# Patient Record
Sex: Male | Born: 1965
Health system: Southern US, Community
[De-identification: ages and names within clinical notes are randomized; demographics above are authoritative.]

## PROBLEM LIST (undated history)

## (undated) DIAGNOSIS — F329 Major depressive disorder, single episode, unspecified: Secondary | ICD-10-CM

## (undated) DIAGNOSIS — IMO0001 Reserved for inherently not codable concepts without codable children: Secondary | ICD-10-CM

## (undated) DIAGNOSIS — K219 Gastro-esophageal reflux disease without esophagitis: Secondary | ICD-10-CM

## (undated) DIAGNOSIS — E785 Hyperlipidemia, unspecified: Secondary | ICD-10-CM

## (undated) DIAGNOSIS — M25562 Pain in left knee: Secondary | ICD-10-CM

## (undated) DIAGNOSIS — M549 Dorsalgia, unspecified: Secondary | ICD-10-CM

## (undated) DIAGNOSIS — M25519 Pain in unspecified shoulder: Secondary | ICD-10-CM

## (undated) DIAGNOSIS — F32A Depression, unspecified: Secondary | ICD-10-CM

## (undated) DIAGNOSIS — G8929 Other chronic pain: Secondary | ICD-10-CM

## (undated) DIAGNOSIS — T7840XA Allergy, unspecified, initial encounter: Secondary | ICD-10-CM

## (undated) DIAGNOSIS — E119 Type 2 diabetes mellitus without complications: Secondary | ICD-10-CM

## (undated) DIAGNOSIS — I10 Essential (primary) hypertension: Secondary | ICD-10-CM

## (undated) DIAGNOSIS — Z8739 Personal history of other diseases of the musculoskeletal system and connective tissue: Secondary | ICD-10-CM

## (undated) DIAGNOSIS — M25561 Pain in right knee: Secondary | ICD-10-CM

## (undated) DIAGNOSIS — Z9289 Personal history of other medical treatment: Secondary | ICD-10-CM

## (undated) DIAGNOSIS — N289 Disorder of kidney and ureter, unspecified: Secondary | ICD-10-CM

## (undated) HISTORY — DX: Personal history of other medical treatment: Z92.89

## (undated) HISTORY — DX: Allergy, unspecified, initial encounter: T78.40XA

## (undated) HISTORY — DX: Essential (primary) hypertension: I10

## (undated) HISTORY — DX: Hyperlipidemia, unspecified: E78.5

---

## 2011-07-04 ENCOUNTER — Emergency Department (HOSPITAL_BASED_OUTPATIENT_CLINIC_OR_DEPARTMENT_OTHER)
Admission: EM | Admit: 2011-07-04 | Discharge: 2011-07-04 | Disposition: A | Discharge status: Home / Self Care | Payer: Self-pay | Attending: Emergency Medicine | Admitting: Emergency Medicine

## 2011-07-04 DIAGNOSIS — J45901 Unspecified asthma with (acute) exacerbation: Secondary | ICD-10-CM

## 2011-07-04 DIAGNOSIS — J45909 Unspecified asthma, uncomplicated: Secondary | ICD-10-CM | POA: Insufficient documentation

## 2011-07-04 DIAGNOSIS — R0602 Shortness of breath: Secondary | ICD-10-CM | POA: Insufficient documentation

## 2011-07-04 MED ORDER — PREDNISONE 20 MG PO TABS: 40.0000 mg | ORAL_TABLET | Freq: Every day | ORAL | Status: AC

## 2011-07-04 MED ORDER — PREDNISONE 10 MG PO TABS
60.0000 mg | ORAL_TABLET | Freq: Once | ORAL | Status: AC
  Administered 2011-07-04: 60 mg via ORAL
  Filled 2011-07-04: qty 1

## 2011-07-04 MED ORDER — ALBUTEROL SULFATE HFA 108 (90 BASE) MCG/ACT IN AERS
2.0000 | INHALATION_SPRAY | RESPIRATORY_TRACT | Status: DC | PRN
  Administered 2011-07-04: 2 via RESPIRATORY_TRACT
  Filled 2011-07-04: qty 6.7

## 2011-07-04 MED ORDER — ALBUTEROL SULFATE HFA 108 (90 BASE) MCG/ACT IN AERS: 1.0000 | INHALATION_SPRAY | Freq: Four times a day (QID) | RESPIRATORY_TRACT | Status: DC | PRN

## 2011-07-04 NOTE — ED Notes (Signed)
MD at bedside. 

## 2011-07-04 NOTE — ED Notes (Signed)
Pt reports intermittent SHOB x 2 weeks.  He is out of Albuterol Inhaler

## 2011-07-04 NOTE — ED Provider Notes (Signed)
History     CSN: [id number] Arrival date & time: 07/04/2011  7:26 AM   First MD Initiated Contact with Patient 07/04/11 [phone]      Chief Complaint  Patient presents with  . Shortness of Breath    (Consider location/radiation/quality/duration/timing/severity/associated sxs/prior treatment) Patient is a 45 y.o. male presenting with shortness of breath. The history is provided by the patient.  Shortness of Breath  The current episode started more than 2 weeks ago. The onset was gradual. The problem occurs frequently. The problem has been gradually worsening. The problem is moderate. The symptoms are relieved by cold air and beta-agonist inhalers. The symptoms are aggravated by smoke exposure. Associated symptoms include cough, shortness of breath and wheezing. Pertinent negatives include no chest pain, no fever, no rhinorrhea and no sore throat. The cough is non-productive, dry and hacking. The cough is relieved by beta-agonist inhalers and cold air. He was not exposed to toxic fumes. He has not inhaled smoke recently. His past medical history is significant for asthma. He has been behaving normally. Urine output has been normal. There were no sick contacts. Services received include medications given.   Pt recently moved in with sister who smokes all the time and since the new smoke exposure he has been having trouble with his asthma.  Inhaler helps some but states he thinks he is out of it.  Past Medical History  Diagnosis Date  . Asthma     History reviewed. No pertinent past surgical history.  No family history on file.  History  Substance Use Topics  . Smoking status: Former Games developer  . Smokeless tobacco: Never Used  . Alcohol Use: No      Review of Systems  Constitutional: Negative for fever.  HENT: Negative for sore throat and rhinorrhea.   Respiratory: Positive for cough, shortness of breath and wheezing.   Cardiovascular: Negative for chest pain.  All other systems  reviewed and are negative.    Allergies  Review of patient's allergies indicates no known allergies.  Home Medications   Current Outpatient Rx  Name Route Sig Dispense Refill  . IPRATROPIUM-ALBUTEROL 18-103 MCG/ACT IN AERO Inhalation Inhale 2 puffs into the lungs every 6 (six) hours as needed.        BP 131/71  Pulse 64  Temp(Src) 98.2 F (36.8 C) (Oral)  Resp 16  Ht 6\' 1"  (1.854 m)  Wt 198 lb (89.812 kg)  BMI 26.12 kg/m2  SpO2 100%  Physical Exam  Nursing note and vitals reviewed. Constitutional: He is oriented to person, place, and time. He appears well-developed and well-nourished. No distress.  HENT:  Head: Normocephalic and atraumatic.  Eyes: Pupils are equal, round, and reactive to light.  Neck: Normal range of motion. Neck supple.  Cardiovascular: Normal rate, regular rhythm, normal heart sounds and intact distal pulses.   Pulmonary/Chest: Effort normal and breath sounds normal. No respiratory distress. He has no wheezes.  Neurological: He is alert and oriented to person, place, and time.  Skin: Skin is warm and dry.  Psychiatric: He has a normal mood and affect. His behavior is normal. Judgment normal.    ED Course  Procedures (including critical care time)  Labs Reviewed - No data to display No results found.   No diagnosis found.    MDM  Pt with typical asthma exacerbation  symptoms.  No infectious sx, productive cough or other complaints.  No wheezing on exam but sx for 2 weeks and ongoing exposure to smoke due to  living situation.  will give steroids, albuterol pump.         Gwyneth Sprout, MD 07/04/11 [phone]

## 2011-10-29 ENCOUNTER — Encounter (HOSPITAL_COMMUNITY): Payer: Self-pay | Admitting: Emergency Medicine

## 2011-10-29 ENCOUNTER — Emergency Department (HOSPITAL_COMMUNITY)
Admission: EM | Admit: 2011-10-29 | Discharge: 2011-10-29 | Discharge status: Against Medical Advice | Payer: Self-pay | Attending: Emergency Medicine | Admitting: Emergency Medicine

## 2011-10-29 DIAGNOSIS — Z0389 Encounter for observation for other suspected diseases and conditions ruled out: Secondary | ICD-10-CM | POA: Insufficient documentation

## 2011-10-29 NOTE — ED Notes (Signed)
Pt states he has had a headache on and off for the past week and a half and is c/o bilateral knee pain right worse than the left and is c/o low back pain  Pt states it hurts to sit for a long time or stand for a long time  Pt states he periodically has pain in his neck  Pt states he also has reflux after eating and he feels like food gets hung up in his throat while eating and he has to drink a lot to wash it down

## 2011-11-07 ENCOUNTER — Emergency Department (HOSPITAL_BASED_OUTPATIENT_CLINIC_OR_DEPARTMENT_OTHER)
Admission: EM | Admit: 2011-11-07 | Discharge: 2011-11-07 | Disposition: A | Discharge status: Home Health | Payer: Self-pay | Attending: Emergency Medicine | Admitting: Emergency Medicine

## 2011-11-07 ENCOUNTER — Encounter (HOSPITAL_BASED_OUTPATIENT_CLINIC_OR_DEPARTMENT_OTHER): Payer: Self-pay | Admitting: Emergency Medicine

## 2011-11-07 DIAGNOSIS — M25519 Pain in unspecified shoulder: Secondary | ICD-10-CM | POA: Insufficient documentation

## 2011-11-07 DIAGNOSIS — M549 Dorsalgia, unspecified: Secondary | ICD-10-CM | POA: Insufficient documentation

## 2011-11-07 DIAGNOSIS — M25569 Pain in unspecified knee: Secondary | ICD-10-CM | POA: Insufficient documentation

## 2011-11-07 DIAGNOSIS — G8929 Other chronic pain: Secondary | ICD-10-CM | POA: Insufficient documentation

## 2011-11-07 DIAGNOSIS — J45909 Unspecified asthma, uncomplicated: Secondary | ICD-10-CM | POA: Insufficient documentation

## 2011-11-07 HISTORY — DX: Pain in unspecified shoulder: M25.519

## 2011-11-07 HISTORY — DX: Other chronic pain: G89.29

## 2011-11-07 HISTORY — DX: Pain in left knee: M25.562

## 2011-11-07 HISTORY — DX: Pain in right knee: M25.561

## 2011-11-07 HISTORY — DX: Dorsalgia, unspecified: M54.9

## 2011-11-07 MED ORDER — IBUPROFEN 800 MG PO TABS
800.0000 mg | ORAL_TABLET | Freq: Once | ORAL | Status: AC
  Administered 2011-11-07: 800 mg via ORAL
  Filled 2011-11-07: qty 1

## 2011-11-07 MED ORDER — PREDNISONE 10 MG PO TABS: 20.0000 mg | ORAL_TABLET | Freq: Every day | ORAL | Status: DC

## 2011-11-07 MED ORDER — OXYCODONE-ACETAMINOPHEN 5-325 MG PO TABS
1.0000 | ORAL_TABLET | Freq: Once | ORAL | Status: AC
  Administered 2011-11-07: 1 via ORAL
  Filled 2011-11-07: qty 1

## 2011-11-07 MED ORDER — CYCLOBENZAPRINE HCL 10 MG PO TABS: 10.0000 mg | ORAL_TABLET | Freq: Two times a day (BID) | ORAL | Status: AC | PRN
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
## 2011-11-07 NOTE — Discharge Instructions (Signed)
Back Exercises Back exercises help treat and prevent back injuries. The goal of back exercises is to increase the strength of your abdominal and back muscles and the flexibility of your back. These exercises should be started when you no longer have back pain. Back exercises include:  Pelvic Tilt. Lie on your back with your knees bent. Tilt your pelvis until the lower part of your back is against the floor. Hold this position 5 to 10 sec and repeat 5 to 10 times.   Knee to Chest. Pull first 1 knee up against your chest and hold for 20 to 30 seconds, repeat this with the other knee, and then both knees. This may be done with the other leg straight or bent, whichever feels better.   Sit-Ups or Curl-Ups. Bend your knees 90 degrees. Start with tilting your pelvis, and do a partial, slow sit-up, lifting your trunk only 30 to 45 degrees off the floor. Take at least 2 to 3 seconds for each sit-up. Do not do sit-ups with your knees out straight. If partial sit-ups are difficult, simply do the above but with only tightening your abdominal muscles and holding it as directed.   Hip-Lift. Lie on your back with your knees flexed 90 degrees. Push down with your feet and shoulders as you raise your hips a couple inches off the floor; hold for 10 seconds, repeat 5 to 10 times.   Back arches. Lie on your stomach, propping yourself up on bent elbows. Slowly press on your hands, causing an arch in your low back. Repeat 3 to 5 times. Any initial stiffness and discomfort should lessen with repetition over time.   Shoulder-Lifts. Lie face down with arms beside your body. Keep hips and torso pressed to floor as you slowly lift your head and shoulders off the floor.  Do not overdo your exercises, especially in the beginning. Exercises may cause you some mild back discomfort which lasts for a few minutes; however, if the pain is more severe, or lasts for more than 15 minutes, do not continue exercises until you see your  caregiver. Improvement with exercise therapy for back problems is slow.  See your caregivers for assistance with developing a proper back exercise program. Document Released: 10/11/2004 Document Revised: 05/02/2011 Document Reviewed: 09/03/2005 Exit[employer] Patient Information 2012 [employer].Back Pain, Adult Low back pain is very common. About 1 in 5 people have back pain.The cause of low back pain is rarely dangerous. The pain often gets better over time.About half of people with a sudden onset of back pain feel better in just 2 weeks. About 8 in 10 people feel better by 6 weeks.  CAUSES Some common causes of back pain include:  Strain of the muscles or ligaments supporting the spine.   Wear and tear (degeneration) of the spinal discs.   Arthritis.   Direct injury to the back.  DIAGNOSIS Most of the time, the direct cause of low back pain is not known.However, back pain can be treated effectively even when the exact cause of the pain is unknown.Answering your caregiver's questions about your overall health and symptoms is one of the most accurate ways to make sure the cause of your pain is not dangerous. If your caregiver needs more information, he or she may order lab work or imaging tests (X-rays or MRIs).However, even if imaging tests show changes in your back, this usually does not require surgery. HOME CARE INSTRUCTIONS For many people, back pain returns.Since low back pain is rarely   dangerous, it is often a condition that people can learn to manageon their own.   Remain active. It is stressful on the back to sit or stand in one place. Do not sit, drive, or stand in one place for more than 30 minutes at a time. Take short walks on level surfaces as soon as pain allows.Try to increase the length of time you walk each day.   Do not stay in bed.Resting more than 1 or 2 days can delay your recovery.   Do not avoid exercise or work.Your body is made to move.It is not dangerous  to be active, even though your back may hurt.Your back will likely heal faster if you return to being active before your pain is gone.   Pay attention to your body when you bend and lift. Many people have less discomfortwhen lifting if they bend their knees, keep the load close to their bodies,and avoid twisting. Often, the most comfortable positions are those that put less stress on your recovering back.   Find a comfortable position to sleep. Use a firm mattress and lie on your side with your knees slightly bent. If you lie on your back, put a pillow under your knees.   Only take over-the-counter or prescription medicines as directed by your caregiver. Over-the-counter medicines to reduce pain and inflammation are often the most helpful.Your caregiver may prescribe muscle relaxant drugs.These medicines help dull your pain so you can more quickly return to your normal activities and healthy exercise.   Put ice on the injured area.   Put ice in a plastic bag.   Place a towel between your skin and the bag.   Leave the ice on for 15 to 20 minutes, 3 to 4 times a day for the first 2 to 3 days. After that, ice and heat may be alternated to reduce pain and spasms.   Ask your caregiver about trying back exercises and gentle massage. This may be of some benefit.   Avoid feeling anxious or stressed.Stress increases muscle tension and can worsen back pain.It is important to recognize when you are anxious or stressed and learn ways to manage it.Exercise is a great option.  SEEK MEDICAL CARE IF:  You have pain that is not relieved with rest or medicine.   You have pain that does not improve in 1 week.   You have new symptoms.   You are generally not feeling well.  SEEK IMMEDIATE MEDICAL CARE IF:   You have pain that radiates from your back into your legs.   You develop new bowel or bladder control problems.   You have unusual weakness or numbness in your arms or legs.   You develop  nausea or vomiting.   You develop abdominal pain.   You feel faint.  Document Released: 09/03/2005 Document Revised: 05/16/2011 Document Reviewed: 01/22/2011 [employer] Patient Information 2012 [employer]. 

## 2011-11-07 NOTE — ED Notes (Addendum)
Pt states he has chronic back pain, worse in last month. Some numbness/tingling in legs.  No problems with urination or BM.  Pt states that he may have lifted something at work and aggrivated his pain, also has new bed which may have worsened his pain.  Pt also states he is having left shoulder pain, bilateral knee pain x one year.  Also c/o headache one month ago.  Pain over both eyes.  No nasal congestion.  Pt had fever one month ago.  Pt states he is having a lot of pain all over.

## 2011-11-09 ENCOUNTER — Encounter: Payer: Self-pay | Admitting: Family Medicine

## 2011-11-09 ENCOUNTER — Ambulatory Visit (HOSPITAL_BASED_OUTPATIENT_CLINIC_OR_DEPARTMENT_OTHER)
Admission: RE | Admit: 2011-11-09 | Discharge: 2011-11-09 | Disposition: A | Discharge status: Home / Self Care | Payer: Self-pay | Source: Ambulatory Visit | Attending: Family Medicine | Admitting: Family Medicine

## 2011-11-09 ENCOUNTER — Ambulatory Visit (INDEPENDENT_AMBULATORY_CARE_PROVIDER_SITE_OTHER): Payer: Self-pay | Admitting: Family Medicine

## 2011-11-09 DIAGNOSIS — M545 Low back pain, unspecified: Secondary | ICD-10-CM | POA: Insufficient documentation

## 2011-11-09 DIAGNOSIS — M25561 Pain in right knee: Secondary | ICD-10-CM

## 2011-11-09 DIAGNOSIS — M25569 Pain in unspecified knee: Secondary | ICD-10-CM

## 2011-11-09 DIAGNOSIS — M549 Dorsalgia, unspecified: Secondary | ICD-10-CM

## 2011-11-09 DIAGNOSIS — G8929 Other chronic pain: Secondary | ICD-10-CM

## 2011-11-09 NOTE — Patient Instructions (Signed)
You have chronic low back pain - you have mild arthritis but typically pain isn't this severe with only arthritis. You may have a bulging or herniated disc as well as muscle spasms - all of these are treated the same initially. Take tylenol for baseline pain relief (1-2 extra strength tabs 3x/day) Meloxicam daily with food for pain and inflammation (if you do not have stomach or kidney issues). Tramadol as needed for severe pain (no driving on this medicine). Flexeril as needed for muscle spasms (no driving on this medicine). Stay as active as possible. Start physical therapy and do home exercises they show you on days you do not go. If after 1 month to 6 weeks you haven't improved as expected, would then consider MRI to further assess. Make sure you work with Rudell Cobb for [facility] Coverage (do this now). Consider massage, chiropractor, physical therapy, and/or acupuncture. Physical therapy has been shown to be helpful while the others have mixed results. Strengthening of low back muscles, abdominal musculature are key for long term pain relief.  For your knees you really don't have much in the way of arthritis - mild on left, minimal on right. Medicines discussed above should help some. Glucosamine sulfate 750mg  twice a day is a supplement that has been shown to help with arthritis. Capsaicin topically up to four times a day may also help with pain (over the counter). Cortisone injections are an option. If cortisone injections do not help, there are different types of shots that may help but they take longer to take effect. It's important that you continue to stay active. Consider physical therapy to strengthen muscles around the joint that hurts to take pressure off of the joint itself. Heat or ice 15 minutes at a time 3-4 times a day as needed to help with pain. Water aerobics and cycling with low resistance are the best two types of exercise for arthritis.  Follow up with me in 1 month  for reevaluation.

## 2011-11-12 ENCOUNTER — Encounter: Payer: Self-pay | Admitting: Family Medicine

## 2011-11-12 DIAGNOSIS — M25561 Pain in right knee: Secondary | ICD-10-CM | POA: Insufficient documentation

## 2011-11-12 DIAGNOSIS — M545 Low back pain: Secondary | ICD-10-CM | POA: Insufficient documentation

## 2011-11-12 NOTE — Assessment & Plan Note (Signed)
only mild arthritis, possible narrowing at L5-S1 region based on radiographs.  Discussed regardless of whether this is due to lumbar strain, disc herniation, disc bulge, would trial conservative treatment for 1 month to 6 weeks with physical therapy, meloxicam, flexeril, tramadol.  If not improving with this, would move forward with MRI.

## 2011-11-12 NOTE — Assessment & Plan Note (Signed)
minimal-mild DJD, possible degenerative meniscal tears as cause as well.  No mechanical symptoms.  Start meloxicam, glucosamine.  Discussed capsaicin.  He would like to hold off on cortisone injections though stated would consider these if not improving as expected.  F/u in 1 month
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
## 2011-11-26 ENCOUNTER — Ambulatory Visit: Payer: Self-pay | Admitting: Physical Therapy

## 2011-12-03 ENCOUNTER — Ambulatory Visit: Payer: Self-pay | Attending: Family Medicine | Admitting: Physical Therapy

## 2011-12-03 DIAGNOSIS — M25569 Pain in unspecified knee: Secondary | ICD-10-CM | POA: Insufficient documentation

## 2011-12-03 DIAGNOSIS — IMO0001 Reserved for inherently not codable concepts without codable children: Secondary | ICD-10-CM | POA: Insufficient documentation

## 2011-12-03 DIAGNOSIS — M545 Low back pain, unspecified: Secondary | ICD-10-CM | POA: Insufficient documentation

## 2011-12-03 DIAGNOSIS — R5381 Other malaise: Secondary | ICD-10-CM | POA: Insufficient documentation

## 2011-12-07 ENCOUNTER — Ambulatory Visit: Payer: Self-pay | Admitting: Family Medicine

## 2011-12-12 ENCOUNTER — Ambulatory Visit: Payer: Self-pay | Admitting: Rehabilitation

## 2011-12-18 ENCOUNTER — Ambulatory Visit: Payer: No Typology Code available for payment source | Attending: Family Medicine | Admitting: Rehabilitation

## 2011-12-18 DIAGNOSIS — M25569 Pain in unspecified knee: Secondary | ICD-10-CM | POA: Insufficient documentation

## 2011-12-18 DIAGNOSIS — IMO0001 Reserved for inherently not codable concepts without codable children: Secondary | ICD-10-CM | POA: Insufficient documentation

## 2011-12-18 DIAGNOSIS — M545 Low back pain, unspecified: Secondary | ICD-10-CM | POA: Insufficient documentation

## 2011-12-18 DIAGNOSIS — R5381 Other malaise: Secondary | ICD-10-CM | POA: Insufficient documentation

## 2011-12-26 ENCOUNTER — Encounter: Payer: Self-pay | Admitting: Physical Therapy

## 2012-01-01 ENCOUNTER — Encounter: Payer: Self-pay | Admitting: Rehabilitation

## 2012-01-09 ENCOUNTER — Encounter: Payer: Self-pay | Admitting: Physical Therapy

## 2012-02-08 ENCOUNTER — Emergency Department (HOSPITAL_BASED_OUTPATIENT_CLINIC_OR_DEPARTMENT_OTHER)
Admission: EM | Admit: 2012-02-08 | Discharge: 2012-02-08 | Disposition: A | Discharge status: Home / Self Care | Payer: Self-pay | Attending: Emergency Medicine | Admitting: Emergency Medicine

## 2012-02-08 ENCOUNTER — Encounter (HOSPITAL_BASED_OUTPATIENT_CLINIC_OR_DEPARTMENT_OTHER): Payer: Self-pay | Admitting: *Deleted

## 2012-02-08 DIAGNOSIS — J45901 Unspecified asthma with (acute) exacerbation: Secondary | ICD-10-CM | POA: Insufficient documentation

## 2012-02-08 DIAGNOSIS — G8929 Other chronic pain: Secondary | ICD-10-CM | POA: Insufficient documentation

## 2012-02-08 DIAGNOSIS — K219 Gastro-esophageal reflux disease without esophagitis: Secondary | ICD-10-CM | POA: Insufficient documentation

## 2012-02-08 DIAGNOSIS — I10 Essential (primary) hypertension: Secondary | ICD-10-CM | POA: Insufficient documentation

## 2012-02-08 HISTORY — DX: Major depressive disorder, single episode, unspecified: F32.9

## 2012-02-08 HISTORY — DX: Gastro-esophageal reflux disease without esophagitis: K21.9

## 2012-02-08 HISTORY — DX: Depression, unspecified: F32.A

## 2012-02-08 HISTORY — DX: Reserved for inherently not codable concepts without codable children: IMO0001

## 2012-02-08 MED ORDER — PREDNISONE 20 MG PO TABS: 60.0000 mg | ORAL_TABLET | Freq: Every day | ORAL | Status: AC

## 2012-02-08 MED ORDER — ALBUTEROL SULFATE HFA 108 (90 BASE) MCG/ACT IN AERS
2.0000 | INHALATION_SPRAY | RESPIRATORY_TRACT | Status: DC | PRN
  Administered 2012-02-08: 2 via RESPIRATORY_TRACT
  Filled 2012-02-08: qty 6.7

## 2012-02-08 MED ORDER — ALBUTEROL SULFATE (5 MG/ML) 0.5% IN NEBU
5.0000 mg | INHALATION_SOLUTION | Freq: Once | RESPIRATORY_TRACT | Status: AC
Start: 2012-02-08 — End: 2012-02-08
  Administered 2012-02-08: 5 mg via RESPIRATORY_TRACT
  Filled 2012-02-08: qty 1

## 2012-02-08 MED ORDER — PREDNISONE 10 MG PO TABS
60.0000 mg | ORAL_TABLET | Freq: Once | ORAL | Status: AC
  Administered 2012-02-08: 60 mg via ORAL
  Filled 2012-02-08: qty 1

## 2012-02-08 MED ORDER — IPRATROPIUM BROMIDE 0.02 % IN SOLN
0.5000 mg | Freq: Once | RESPIRATORY_TRACT | Status: AC
  Administered 2012-02-08: 0.5 mg via RESPIRATORY_TRACT
  Filled 2012-02-08: qty 2.5

## 2012-02-08 NOTE — Discharge Instructions (Signed)
Asthma, Adult  Asthma is caused by narrowing of the air passages in the lungs. It may be triggered by pollen, dust, animal dander, molds, some foods, respiratory infections, exposure to smoke, exercise, emotional stress or other allergens (things that cause allergic reactions or allergies). Repeat attacks are common.  HOME CARE INSTRUCTIONS    Use prescription medications as ordered by your caregiver.   Avoid pollen, dust, animal dander, molds, smoke and other things that cause attacks at home and at work.   You may have fewer attacks if you decrease dust in your home. Electrostatic air cleaners may help.   It may help to replace your pillows or mattress with materials less likely to cause allergies.   Talk to your caregiver about an action plan for managing asthma attacks at home, including, the use of a peak flow meter which measures the severity of your asthma attack. An action plan can help minimize or stop the attack without having to seek medical care.   If you are not on a fluid restriction, drink 8 to 10 glasses of water each day.   Always have a plan prepared for seeking medical attention, including, calling your physician, accessing local emergency care, and calling 911 (in the [place] for a severe attack.   Discuss possible exercise routines with your caregiver.   If animal dander is the cause of asthma, you may need to get rid of pets.  SEEK MEDICAL CARE IF:    You have wheezing and shortness of breath even if taking medicine to prevent attacks.   You have muscle aches, chest pain or thickening of sputum.   Your sputum changes from clear or [place] to yellow, green, gray, or bloody.   You have any problems that may be related to the medicine you are taking (such as a rash, itching, swelling or trouble breathing).  SEEK IMMEDIATE MEDICAL CARE IF:    Your usual medicines do not stop your wheezing or there is increased coughing and/or shortness of breath.   You have increased difficulty  breathing.   You have a fever.  MAKE SURE YOU:    Understand these instructions.   Will watch your condition.   Will get help right away if you are not doing well or get worse.  Document Released: 09/03/2005 Document Revised: 08/23/2011 Document Reviewed: 04/21/2008  ExitCare Patient Information 2012 [employer]  Asthma, Adult  Asthma is caused by narrowing of the air passages in the lungs. It may be triggered by pollen, dust, animal dander, molds, some foods, respiratory infections, exposure to smoke, exercise, emotional stress or other allergens (things that cause allergic reactions or allergies). Repeat attacks are common.  HOME CARE INSTRUCTIONS    Use prescription medications as ordered by your caregiver.   Avoid pollen, dust, animal dander, molds, smoke and other things that cause attacks at home and at work.   You may have fewer attacks if you decrease dust in your home. Electrostatic air cleaners may help.   It may help to replace your pillows or mattress with materials less likely to cause allergies.   Talk to your caregiver about an action plan for managing asthma attacks at home, including, the use of a peak flow meter which measures the severity of your asthma attack. An action plan can help minimize or stop the attack without having to seek medical care.   If you are not on a fluid restriction, drink 8 to 10 glasses of water each day.   Always have   a plan prepared for seeking medical attention, including, calling your physician, accessing local emergency care, and calling 911 (in the [place] for a severe attack.   Discuss possible exercise routines with your caregiver.   If animal dander is the cause of asthma, you may need to get rid of pets.  SEEK MEDICAL CARE IF:    You have wheezing and shortness of breath even if taking medicine to prevent attacks.   You have muscle aches, chest pain or thickening of sputum.   Your sputum changes from clear or [place] to yellow, green, gray, or  bloody.   You have any problems that may be related to the medicine you are taking (such as a rash, itching, swelling or trouble breathing).  SEEK IMMEDIATE MEDICAL CARE IF:    Your usual medicines do not stop your wheezing or there is increased coughing and/or shortness of breath.   You have increased difficulty breathing.   You have a fever.  MAKE SURE YOU:    Understand these instructions.   Will watch your condition.   Will get help right away if you are not doing well or get worse.  Document Released: 09/03/2005 Document Revised: 08/23/2011 Document Reviewed: 04/21/2008  Exit[employer] Patient Information 2012 [employer]

## 2012-02-08 NOTE — ED Notes (Signed)
Chronic Back pain for one year.  Today back and neck pain, right elbow numbness upon waking.  Was seen by Dr. Parks Neptune and given mediciation, which he did not get filled.
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
## 2012-02-20 ENCOUNTER — Encounter: Payer: Self-pay | Admitting: Family Medicine

## 2012-02-20 ENCOUNTER — Ambulatory Visit (INDEPENDENT_AMBULATORY_CARE_PROVIDER_SITE_OTHER): Payer: Self-pay | Admitting: Family Medicine

## 2012-02-20 VITALS — BP 143/99 | HR 73 | Temp 98.2°F | Ht 73.0 in | Wt 202.0 lb

## 2012-02-20 DIAGNOSIS — M25561 Pain in right knee: Secondary | ICD-10-CM

## 2012-02-20 DIAGNOSIS — M545 Low back pain: Secondary | ICD-10-CM

## 2012-02-20 DIAGNOSIS — M25569 Pain in unspecified knee: Secondary | ICD-10-CM

## 2012-02-20 DIAGNOSIS — M542 Cervicalgia: Secondary | ICD-10-CM

## 2012-02-20 DIAGNOSIS — M25562 Pain in left knee: Secondary | ICD-10-CM

## 2012-02-20 DIAGNOSIS — G8929 Other chronic pain: Secondary | ICD-10-CM

## 2012-02-20 MED ORDER — PREDNISONE 10 MG PO TABS: ORAL_TABLET | ORAL | Status: DC

## 2012-02-20 NOTE — Patient Instructions (Signed)
Start prednisone dose pack x 6 days as directed for your neck - do not take meloxicam while you are on this. We will go ahead with an MRI of your low back since this has not improved as expected with PT, medications. For your knee you were given a cortisone injection today - we can repeat these as often as every 3 months if necessary. I will call you with the MRI results and also to check on your progress usually the business day following the MRI.

## 2012-02-22 ENCOUNTER — Encounter: Payer: Self-pay | Admitting: Family Medicine
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
## 2012-02-26 ENCOUNTER — Ambulatory Visit (HOSPITAL_BASED_OUTPATIENT_CLINIC_OR_DEPARTMENT_OTHER)
Admission: RE | Admit: 2012-02-26 | Discharge: 2012-02-26 | Disposition: A | Discharge status: Home / Self Care | Payer: Self-pay | Source: Ambulatory Visit | Attending: Family Medicine | Admitting: Family Medicine

## 2012-02-26 DIAGNOSIS — M545 Low back pain: Secondary | ICD-10-CM

## 2012-02-26 DIAGNOSIS — M47817 Spondylosis without myelopathy or radiculopathy, lumbosacral region: Secondary | ICD-10-CM | POA: Insufficient documentation

## 2012-02-26 DIAGNOSIS — M542 Cervicalgia: Secondary | ICD-10-CM | POA: Insufficient documentation

## 2012-02-26 DIAGNOSIS — M48061 Spinal stenosis, lumbar region without neurogenic claudication: Secondary | ICD-10-CM | POA: Insufficient documentation

## 2012-02-26 NOTE — Assessment & Plan Note (Signed)
minimal-mild DJD, possible degenerative meniscal tears as cause as well.  Right knee now feeling worse with occasional feeling of giving out.  Can continue meloxicam, try glucosamine and capsaicin.  Given cortisone injection right knee.    After informed written consent, patient was seated on exam table. Right knee was prepped with alcohol swab and utilizing anteromedial approach, patient's right knee was injected intraarticularly with 3:1 marcaine: depomedrol. Patient tolerated the procedure well without immediate complications.

## 2012-02-26 NOTE — Assessment & Plan Note (Signed)
symptoms indicative of mild cervical radiculopathy.  Start with prednisone dose pack.  If still having difficulty would consider formal PT.

## 2012-02-26 NOTE — Assessment & Plan Note (Signed)
only mild arthritis, possible narrowing at L5-S1 region based on radiographs.  Has completed PT and HEP - feels worse after this.  Tried meloxicam, tramadol, tylenol, and flexeril.  Will move forward with MRI of lumbar spine.

## 2012-03-05 ENCOUNTER — Emergency Department (INDEPENDENT_AMBULATORY_CARE_PROVIDER_SITE_OTHER): Payer: Self-pay

## 2012-03-05 ENCOUNTER — Encounter (HOSPITAL_COMMUNITY): Payer: Self-pay | Admitting: *Deleted

## 2012-03-05 ENCOUNTER — Emergency Department (INDEPENDENT_AMBULATORY_CARE_PROVIDER_SITE_OTHER)
Admission: EM | Admit: 2012-03-05 | Discharge: 2012-03-05 | Disposition: A | Discharge status: Home / Self Care | Payer: Self-pay | Source: Home / Self Care | Attending: Emergency Medicine | Admitting: Emergency Medicine

## 2012-03-05 DIAGNOSIS — IMO0002 Reserved for concepts with insufficient information to code with codable children: Secondary | ICD-10-CM

## 2012-03-05 DIAGNOSIS — T148XXA Other injury of unspecified body region, initial encounter: Secondary | ICD-10-CM

## 2012-03-05 DIAGNOSIS — S62309A Unspecified fracture of unspecified metacarpal bone, initial encounter for closed fracture: Secondary | ICD-10-CM

## 2012-03-05 MED ORDER — TETANUS-DIPHTH-ACELL PERTUSSIS 5-2.5-18.5 LF-MCG/0.5 IM SUSP
0.5000 mL | Freq: Once | INTRAMUSCULAR | Status: AC
  Administered 2012-03-05: 0.5 mL via INTRAMUSCULAR

## 2012-03-05 MED ORDER — TETANUS-DIPHTH-ACELL PERTUSSIS 5-2.5-18.5 LF-MCG/0.5 IM SUSP
INTRAMUSCULAR | Status: AC
  Filled 2012-03-05: qty 0.5

## 2012-03-05 MED ORDER — TRAMADOL HCL 50 MG PO TABS: 100.0000 mg | ORAL_TABLET | Freq: Three times a day (TID) | ORAL | Status: AC | PRN
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
## 2012-03-05 NOTE — Discharge Instructions (Signed)
You have had a wound repaired by suturing or stapeling.  Proper wound care will minimize the risk of infection.  Leave the dressing we put on in place for 24 hours.  After that you may change the dressing daily.  Keep the dressing clean and dry.  Assemble all the dressing material before the dressing change.  Wear gloves, dispose of the soiled dressings, and wash your hands before and after the dressing change.  You may bathe and shower after the first 24 hours, but we recommend not swimming or submerging the wound in water for prolonged periods.  Wash the wound gently with soap and water and pat dry.  Try to remove all dried blood and drainage.  If there is a large amount of dried blood, half strength hydrogen peroxide (half hydrogen peroxide and half water) will help to remove it.  Apply a thin layer of antibiotic ointment (Bacitracin or Polysporin--do not use Neosporin since this can cause an allergic reaction).  Cover the wound with a non-stick dressing such as Telfa.  Small wounds may be covered with a Band Aid.  If the skin around the wound looks [place], this means it is too wet.  You may leave the dressing off during the night to let it get some air, but keep it covered during the day.    If your wound has been repaired, the sutures or staples should be removed.  The following is the usual time table for removal:   Head and face--5 to 7 days.  Trunk and extremities--10 days.  Hands and feet--10 days to 2 weeks.  Do not try to remove the sutures yourself.  Come back here to have them removed.  Any sign of infection should prompt you to come back sooner for a recheck.  This includes:   Redness and heat  Swelling  Increasing pain  Fever and chills  Pus drainage   Cast or Splint Care Casts and splints support injured limbs and keep bones from moving while they heal.  HOME CARE  Keep the cast or splint uncovered during the drying period.   A plaster cast can take 24 to 48 hours  to dry.   A fiberglass cast will dry in less than 1 hour.   Do not rest the cast on anything harder than a pillow for 24 hours.   Do not put weight on your injured limb. Do not put pressure on the cast. Wait for your doctor's approval.   Keep the cast or splint dry.   Cover the cast or splint with a plastic bag during baths or wet weather.   If you have a cast over your chest and belly (trunk), take sponge baths until the cast is taken off.   Keep your cast or splint clean. Wash a dirty cast with a damp cloth.   Do not put any objects under your cast or splint. Do not scratch the skin under the cast with an object.   Do not take out the padding from inside your cast.   Exercise your joints near the cast as told by your doctor.   Raise (elevate) your injured limb on 1 or 2 pillows for the first 1 to 3 days.  GET HELP RIGHT AWAY IF:  Your cast or splint cracks.   Your cast or splint is too tight or too loose.   You itch badly under the cast.   Your cast gets wet or has a soft spot.   You have  a bad smell coming from the cast.   You get an object stuck under the cast.   Your skin around the cast becomes red or raw.   You have new or more pain after the cast is put on.   You have fluid leaking through the cast.   You cannot move your fingers or toes.   Your fingers or toes turn colors or are cool, painful, or puffy (swollen).   You have tingling or lose feeling (numbness) around the injured area.   You have pain or pressure under the cast.   You have trouble breathing or have shortness of breath.   You have chest pain.  MAKE SURE YOU:  Understand these instructions.   Will watch your condition.   Will get help right away if you are not doing well or get worse.  Document Released: 01/03/2011 Document Revised: 08/23/2011 Document Reviewed: 01/03/2011 [facility] Patient Information 2012 [place], [place].

## 2012-03-05 NOTE — Progress Notes (Signed)
Orthopedic Tech Progress Note Patient Details:  Javier Beasley January 04, 1966 [id number]  Ortho Devices Type of Ortho Device: Arm foam sling;Ulna gutter splint Ortho Device/Splint Location: right UE Ortho Device/Splint Interventions: Application   Asia R Thompson 03/05/2012, 9:11 PM

## 2012-03-11 NOTE — ED Notes (Signed)
Picked patient up just short of being discharged, received report from karen , rn.  This nurse discharged patient

## 2012-05-21 ENCOUNTER — Ambulatory Visit: Payer: Self-pay | Admitting: Sports Medicine

## 2012-09-29 ENCOUNTER — Emergency Department (INDEPENDENT_AMBULATORY_CARE_PROVIDER_SITE_OTHER)
Admission: EM | Admit: 2012-09-29 | Discharge: 2012-09-29 | Disposition: A | Discharge status: Home / Self Care | Payer: No Typology Code available for payment source | Source: Home / Self Care

## 2012-09-29 ENCOUNTER — Encounter (HOSPITAL_COMMUNITY): Payer: Self-pay

## 2012-09-29 DIAGNOSIS — M542 Cervicalgia: Secondary | ICD-10-CM

## 2012-09-29 DIAGNOSIS — M545 Low back pain: Secondary | ICD-10-CM

## 2012-09-29 DIAGNOSIS — J45909 Unspecified asthma, uncomplicated: Secondary | ICD-10-CM

## 2012-09-29 DIAGNOSIS — G8929 Other chronic pain: Secondary | ICD-10-CM

## 2012-09-29 DIAGNOSIS — M25562 Pain in left knee: Secondary | ICD-10-CM

## 2012-09-29 MED ORDER — CYCLOBENZAPRINE HCL 5 MG PO TABS: 10.0000 mg | ORAL_TABLET | Freq: Three times a day (TID) | ORAL | Status: DC | PRN

## 2012-09-29 MED ORDER — TRAMADOL HCL 50 MG PO TABS: 50.0000 mg | ORAL_TABLET | Freq: Four times a day (QID) | ORAL | Status: DC | PRN

## 2012-09-29 MED ORDER — ALBUTEROL SULFATE HFA 108 (90 BASE) MCG/ACT IN AERS: 1.0000 | INHALATION_SPRAY | Freq: Four times a day (QID) | RESPIRATORY_TRACT | Status: DC | PRN

## 2012-09-29 NOTE — Discharge Instructions (Signed)
Asthma Attack Prevention  HOW CAN ASTHMA BE PREVENTED?  Currently, there is no way to prevent asthma from starting. However, you can take steps to control the disease and prevent its symptoms after you have been diagnosed. Learn about your asthma and how to control it. Take an active role to control your asthma by working with your caregiver to create and follow an asthma action plan. An asthma action plan guides you in taking your medicines properly, avoiding factors that make your asthma worse, tracking your level of asthma control, responding to worsening asthma, and seeking emergency care when needed. To track your asthma, keep records of your symptoms, check your peak flow number using a peak flow meter (handheld device that shows how well air moves out of your lungs), and get regular asthma checkups.   Other ways to prevent asthma attacks include:   Use medicines as your caregiver directs.   Identify and avoid things that make your asthma worse (as much as you can).   Keep track of your asthma symptoms and level of control.   Get regular checkups for your asthma.   With your caregiver, write a detailed plan for taking medicines and managing an asthma attack. Then be sure to follow your action plan. Asthma is an ongoing condition that needs regular monitoring and treatment.   Identify and avoid asthma triggers. A number of outdoor allergens and irritants (pollen, mold, cold air, air pollution) can trigger asthma attacks. Find out what causes or makes your asthma worse, and take steps to avoid those triggers (see below).   Monitor your breathing. Learn to recognize warning signs of an attack, such as slight coughing, wheezing or shortness of breath. However, your lung function may already decrease before you notice any signs or symptoms, so regularly measure and record your peak airflow with a home peak flow meter.   Identify and treat attacks early. If you act quickly, you're less likely to have a  severe attack. You will also need less medicine to control your symptoms. When your peak flow measurements decrease and alert you to an upcoming attack, take your medicine as instructed, and immediately stop any activity that may have triggered the attack. If your symptoms do not improve, get medical help.   Pay attention to increasing quick-relief inhaler use. If you find yourself relying on your quick-relief inhaler (such as albuterol), your asthma is not under control. See your caregiver about adjusting your treatment.  IDENTIFY AND CONTROL FACTORS THAT MAKE YOUR ASTHMA WORSE  A number of common things can set off or make your asthma symptoms worse (asthma triggers). Keep track of your asthma symptoms for several weeks, detailing all the environmental and emotional factors that are linked with your asthma. When you have an asthma attack, go back to your asthma diary to see which factor, or combination of factors, might have contributed to it. Once you know what these factors are, you can take steps to control many of them.   Allergies: If you have allergies and asthma, it is important to take asthma prevention steps at home. Asthma attacks (worsening of asthma symptoms) can be triggered by allergies, which can cause temporary increased inflammation of your airways. Minimizing contact with the substance to which you are allergic will help prevent an asthma attack.  Animal Dander:    Some people are allergic to the flakes of skin or dried saliva from animals with fur or feathers. Keep these pets out of your home.   If   you can't keep a pet outdoors, keep the pet out of your bedroom and other sleeping areas at all times, and keep the door closed.   Remove carpets and furniture covered with cloth from your home. If that is not possible, keep the pet away from fabric-covered furniture and carpets.  Dust Mites:   Many people with asthma are allergic to dust mites. Dust mites are tiny bugs that are found in every  home, in mattresses, pillows, carpets, fabric-covered furniture, bedcovers, clothes, stuffed toys, fabric, and other fabric-covered items.   Cover your mattress in a special dust-proof cover.   Cover your pillow in a special dust-proof cover, or wash the pillow each week in hot water. Water must be hotter than 130 F to kill dust mites. Cold or warm water used with detergent and bleach can also be effective.   Wash the sheets and blankets on your bed each week in hot water.   Try not to sleep or lie on cloth-covered cushions.   Call ahead when traveling and ask for a smoke-free hotel room. Bring your own bedding and pillows, in case the hotel only supplies feather pillows and down comforters, which may contain dust mites and cause asthma symptoms.   Remove carpets from your bedroom and those laid on concrete, if you can.   Keep stuffed toys out of the bed, or wash the toys weekly in hot water or cooler water with detergent and bleach.  Cockroaches:   Many people with asthma are allergic to the droppings and remains of cockroaches.   Keep food and garbage in closed containers. Never leave food out.   Use poison baits, traps, powders, gels, or paste (for example, boric acid).   If a spray is used to kill cockroaches, stay out of the room until the odor goes away.  Indoor Mold:   Fix leaky faucets, pipes, or other sources of water that have mold around them.   Clean moldy surfaces with a cleaner that has bleach in it.  Pollen and Outdoor Mold:   When pollen or mold spore counts are high, try to keep your windows closed.   Stay indoors with windows closed from late morning to afternoon, if you can. Pollen and some mold spore counts are highest at that time.   Ask your caregiver whether you need to take or increase anti-inflammatory medicine before your allergy season starts.  Irritants:    Tobacco smoke is an irritant. If you smoke, ask your caregiver how you can quit. Ask family members to quit  smoking, too. Do not allow smoking in your home or car.   If possible, do not use a wood-burning stove, kerosene heater, or fireplace. Minimize exposure to all sources of smoke, including incense, candles, fires, and fireworks.   Try to stay away from strong odors and sprays, such as perfume, talcum powder, hair spray, and paints.   Decrease humidity in your home and use an indoor air cleaning device. Reduce indoor humidity to below 60 percent. Dehumidifiers or central air conditioners can do this.   Try to have someone else vacuum for you once or twice a week, if you can. Stay out of rooms while they are being vacuumed and for a short while afterward.   If you vacuum, use a dust mask from a hardware store, a double-layered or microfilter vacuum cleaner bag, or a vacuum cleaner with a HEPA filter.   Sulfites in foods and beverages can be irritants. Do not drink beer or   nose, sinus infections, reflux disease, psychological stress, and sleep apnea. Your caregiver will treat these conditions, as well.  Avoid close contact with people who have a cold or the flu, since your asthma symptoms may get worse if you catch the infection from them. Wash your hands thoroughly after touching items that may have been handled by people with a respiratory infection.  Get a flu shot every year to protect against the flu virus, which often makes asthma worse for days or weeks. Also get a pneumonia shot once every five to 10 years. Drugs:  Aspirin and other painkillers can cause asthma attacks. 10% to 20% of people with asthma have sensitivity to aspirin or a group of painkillers called non-steroidal anti-inflammatory drugs (NSAIDS), such as ibuprofen  and naproxen. These drugs are used to treat pain and reduce fevers. Asthma attacks caused by any of these medicines can be severe and even fatal. These drugs must be avoided in people who have known aspirin sensitive asthma. Products with acetaminophen are considered safe for people who have asthma. It is important that people with aspirin sensitivity read labels of all over-the-counter drugs used to treat pain, colds, coughs, and fever.  Beta blockers and ACE inhibitors are other drugs which you should discuss with your caregiver, in relation to your asthma. ALLERGY SKIN TESTING  Ask your asthma caregiver about allergy skin testing or blood testing (RAST test) to identify the allergens to which you are sensitive. If you are found to have allergies, allergy shots (immunotherapy) for asthma may help prevent future allergies and asthma. With allergy shots, small doses of allergens (substances to which you are allergic) are injected under your skin on a regular schedule. Over a period of time, your body may become used to the allergen and less responsive with asthma symptoms. You can also take measures to minimize your exposure to those allergens. EXERCISE  If you have exercise-induced asthma, or are planning vigorous exercise, or exercise in cold, humid, or dry environments, prevent exercise-induced asthma by following your caregiver's advice regarding asthma treatment before exercising. Document Released: 08/22/2009 Document Revised: 11/26/2011 Document Reviewed: 08/22/2009 [facility] Patient Information 2013 [place], [place]. Back Pain, Adult Back pain is very common. The pain often gets better over time. The cause of back pain is usually not dangerous. Most people can learn to manage their back pain on their own.  HOME CARE   Stay active. Start with short walks on flat ground if you can. Try to walk farther each day.  Do not sit, drive, or stand in one place for more than 30 minutes. Do not stay in  bed.  Do not avoid exercise or work. Activity can help your back heal faster.  Be careful when you bend or lift an object. Bend at your knees, keep the object close to you, and do not twist.  Sleep on a firm mattress. Lie on your side, and bend your knees. If you lie on your back, put a pillow under your knees.  Only take medicines as told by your doctor.  Put ice on the injured area.  Put ice in a plastic bag.  Place a towel between your skin and the bag.  Leave the ice on for 15 to 20 minutes, 3 to 4 times a day for the first 2 to 3 days. After that, you can switch between ice and heat packs.  Ask your doctor about back exercises or massage.  Avoid feeling anxious or stressed. Find good ways to deal  with stress, such as exercise. GET HELP RIGHT AWAY IF:   Your pain does not go away with rest or medicine.  Your pain does not go away in 1 week.  You have new problems.  You do not feel well.  The pain spreads into your legs.  You cannot control when you poop (bowel movement) or pee (urinate).  Your arms or legs feel weak or lose feeling (numbness).  You feel sick to your stomach (nauseous) or throw up (vomit).  You have belly (abdominal) pain.  You feel like you may pass out (faint). MAKE SURE YOU:   Understand these instructions.  Will watch your condition.  Will get help right away if you are not doing well or get worse. Document Released: 02/20/2008 Document Revised: 11/26/2011 Document Reviewed: 01/22/2011 [facility] Patient Information 2013 [place], [place].

## 2012-09-29 NOTE — ED Notes (Signed)
Patient complains of back pain, leg pain, pain in both knees has been going on for a while;

## 2012-09-29 NOTE — ED Provider Notes (Signed)
History    CSN: [id number]  Arrival date & time 09/29/12  1648  Chief Complaint  Patient presents with  . 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  He is having significant problems with pain in the lower spine and sciatica.  Sciatica is much worse on the right side.  He is currently in the process of trying to get disability benefits.  He is currently being evaluated by disability physicians with the state.  The patient reports that he is having sexual dysfunction as well because of the chronic back pain.  No loss of bowel or bladder control or function.  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  He has asthma and reports that he uses albuterol inhaler twice per week.  He only uses them more frequently in the cold weather.  Past Medical History  Diagnosis Date  . Asthma   . Chronic back pain   . Bilateral chronic knee pain   . Chronic shoulder pain   . Hypertension   . Reflux   . Depression     History reviewed. No pertinent past surgical history.  Family History  Problem Relation Age of Onset  . Hypertension Other   . Diabetes Other   . Heart attack Other   . Diabetes Mother   . Hypertension Mother   . Hyperlipidemia Neg Hx   . Sudden death Neg Hx     History  Substance Use Topics  . Smoking status: Never Smoker   . Smokeless tobacco: Never Used  . Alcohol Use: Yes     Comment: weekly    Review of Systems  Constitutional: Negative.   HENT: Negative.   Eyes: Negative.   Respiratory: Negative.   Cardiovascular: Negative.   Gastrointestinal: Negative.   Musculoskeletal: Negative.   Neurological: Negative.   Psychiatric/Behavioral: Negative.   All other systems reviewed and are negative.    Allergies  Review of patient's allergies indicates no known allergies.  Home Medications   Current Outpatient Rx  Name  Route  Sig   Dispense  Refill  . ALBUTEROL SULFATE HFA 108 (90 BASE) MCG/ACT IN AERS   Inhalation   Inhale 1-2 puffs into the lungs every 6 (six) hours as needed for wheezing.   1 Inhaler   0   . IPRATROPIUM-ALBUTEROL 18-103 MCG/ACT IN AERO   Inhalation   Inhale 2 puffs into the lungs every 6 (six) hours as needed.           . CYCLOBENZAPRINE HCL 10 MG PO TABS   Oral   Take 10 mg by mouth 3 (three) times daily as needed.         Marland Kitchen LEVALBUTEROL HCL 0.31 MG/3ML IN NEBU   Nebulization   Take 1 ampule by nebulization every 4 (four) hours as needed.         . MELOXICAM 15 MG PO TABS   Oral   Take 15 mg by mouth daily.         Marland Kitchen PREDNISONE 10 MG PO TABS      6 tabs po day 1, 5 tabs po day 2, 4 tabs po day 3, 3 tabs po day 4, 2 tabs po day 5, 1 tab po day 6   21 tablet   0     BP 138/85  Pulse 63  Temp  98.3 F (36.8 C) (Oral)  Resp 20  SpO2 96%  Physical Exam  Nursing note and vitals reviewed. Constitutional: He is oriented to person, place, and time. He appears well-developed and well-nourished. No distress.  HENT:  Head: Normocephalic and atraumatic.  Eyes: EOM are normal. Pupils are equal, round, and reactive to light.  Neck: Normal range of motion. Neck supple.  Cardiovascular: Normal rate, regular rhythm and normal heart sounds.   Pulmonary/Chest: Effort normal and breath sounds normal.  Abdominal: Soft. Bowel sounds are normal.  Musculoskeletal:       Lumbar back: He exhibits decreased range of motion, tenderness, bony tenderness, pain and spasm. He exhibits no edema.       Back:       Arms: Neurological: He is alert and oriented to person, place, and time. He has normal reflexes.  Skin: Skin is warm and dry. No rash noted. No erythema. No pallor.  Psychiatric: He has a normal mood and affect. His behavior is normal. Judgment and thought content normal.    ED Course  Procedures (including critical care time)  Labs Reviewed - No data to display No results  found.  No diagnosis found.  MDM  IMPRESSION  Chronic LBP  Bulging Discs (lumbar), multiple  Asthma  RECOMMENDATIONS / PLAN  Refilled meds, recommend pt see a back doctor Dental Referral made Reviewed MRI results with patient  FOLLOW UP 3 months for primary care followup  The patient was given clear instructions to go to ER or return to medical center if symptoms don't improve, worsen or new problems develop.  The patient verbalized understanding.  The patient was told to call to get lab results if they haven't heard anything in the next week.            Cleora Fleet, MD 09/29/12 1754

## 2012-10-22 ENCOUNTER — Inpatient Hospital Stay (HOSPITAL_BASED_OUTPATIENT_CLINIC_OR_DEPARTMENT_OTHER)
Admission: EM | Admit: 2012-10-22 | Discharge: 2012-10-24 | DRG: 392 | Disposition: A | Discharge status: Home / Self Care | Payer: No Typology Code available for payment source | Attending: Internal Medicine | Admitting: Internal Medicine

## 2012-10-22 ENCOUNTER — Emergency Department (HOSPITAL_BASED_OUTPATIENT_CLINIC_OR_DEPARTMENT_OTHER): Payer: No Typology Code available for payment source

## 2012-10-22 ENCOUNTER — Encounter (HOSPITAL_BASED_OUTPATIENT_CLINIC_OR_DEPARTMENT_OTHER): Payer: Self-pay

## 2012-10-22 DIAGNOSIS — T7840XA Allergy, unspecified, initial encounter: Secondary | ICD-10-CM | POA: Diagnosis present

## 2012-10-22 DIAGNOSIS — Z79899 Other long term (current) drug therapy: Secondary | ICD-10-CM

## 2012-10-22 DIAGNOSIS — M549 Dorsalgia, unspecified: Secondary | ICD-10-CM | POA: Diagnosis present

## 2012-10-22 DIAGNOSIS — R079 Chest pain, unspecified: Secondary | ICD-10-CM

## 2012-10-22 DIAGNOSIS — G8929 Other chronic pain: Secondary | ICD-10-CM | POA: Diagnosis present

## 2012-10-22 DIAGNOSIS — F3289 Other specified depressive episodes: Secondary | ICD-10-CM | POA: Diagnosis present

## 2012-10-22 DIAGNOSIS — Z91013 Allergy to seafood: Secondary | ICD-10-CM

## 2012-10-22 DIAGNOSIS — K2960 Other gastritis without bleeding: Secondary | ICD-10-CM | POA: Diagnosis present

## 2012-10-22 DIAGNOSIS — M542 Cervicalgia: Secondary | ICD-10-CM

## 2012-10-22 DIAGNOSIS — K219 Gastro-esophageal reflux disease without esophagitis: Secondary | ICD-10-CM | POA: Diagnosis present

## 2012-10-22 DIAGNOSIS — R1310 Dysphagia, unspecified: Secondary | ICD-10-CM | POA: Diagnosis present

## 2012-10-22 DIAGNOSIS — L272 Dermatitis due to ingested food: Secondary | ICD-10-CM | POA: Diagnosis present

## 2012-10-22 DIAGNOSIS — F329 Major depressive disorder, single episode, unspecified: Secondary | ICD-10-CM | POA: Diagnosis present

## 2012-10-22 DIAGNOSIS — N183 Chronic kidney disease, stage 3 unspecified: Secondary | ICD-10-CM | POA: Diagnosis present

## 2012-10-22 DIAGNOSIS — M25562 Pain in left knee: Secondary | ICD-10-CM

## 2012-10-22 DIAGNOSIS — I129 Hypertensive chronic kidney disease with stage 1 through stage 4 chronic kidney disease, or unspecified chronic kidney disease: Secondary | ICD-10-CM | POA: Diagnosis present

## 2012-10-22 DIAGNOSIS — J45909 Unspecified asthma, uncomplicated: Secondary | ICD-10-CM | POA: Diagnosis present

## 2012-10-22 DIAGNOSIS — K200 Eosinophilic esophagitis: Principal | ICD-10-CM | POA: Diagnosis present

## 2012-10-22 DIAGNOSIS — M12819 Other specific arthropathies, not elsewhere classified, unspecified shoulder: Secondary | ICD-10-CM | POA: Diagnosis present

## 2012-10-22 DIAGNOSIS — M545 Low back pain: Secondary | ICD-10-CM

## 2012-10-22 DIAGNOSIS — N179 Acute kidney failure, unspecified: Secondary | ICD-10-CM | POA: Diagnosis present

## 2012-10-22 DIAGNOSIS — M25569 Pain in unspecified knee: Secondary | ICD-10-CM | POA: Diagnosis present

## 2012-10-22 LAB — TROPONIN I: Troponin I: <0.3 ng/mL (ref <0.30)

## 2012-10-22 LAB — CBC WITH DIFFERENTIAL/PLATELET
Basophils Absolute: 0 10*3/uL (ref 0.0–0.1)
Lymphocytes Relative: 13 % (ref 12–46)
Neutro Abs: 7.4 10*3/uL (ref 1.7–7.7)
Platelets: 281 10*3/uL (ref 150–400)
RDW: 12.9 % (ref 11.5–15.5)
WBC: 9.3 10*3/uL (ref 4.0–10.5)

## 2012-10-22 LAB — BASIC METABOLIC PANEL
CO2: 26 mEq/L (ref 19–32)
Chloride: 99 mEq/L (ref 96–112)
Potassium: 3.7 mEq/L (ref 3.5–5.1)
Sodium: 140 mEq/L (ref 135–145)

## 2012-10-22 MED ORDER — SODIUM CHLORIDE 0.9 % IV SOLN
Freq: Once | INTRAVENOUS | Status: AC
  Administered 2012-10-22: 22:00:00 via INTRAVENOUS

## 2012-10-22 MED ORDER — FAMOTIDINE IN NACL 20-0.9 MG/50ML-% IV SOLN
20.0000 mg | Freq: Once | INTRAVENOUS | Status: AC
  Administered 2012-10-22: 20 mg via INTRAVENOUS
  Filled 2012-10-22: qty 50

## 2012-10-22 MED ORDER — MORPHINE SULFATE 2 MG/ML IJ SOLN
2.0000 mg | Freq: Once | INTRAMUSCULAR | Status: AC
  Administered 2012-10-22: 2 mg via INTRAVENOUS
  Filled 2012-10-22: qty 1

## 2012-10-22 MED ORDER — METHYLPREDNISOLONE SODIUM SUCC 125 MG IJ SOLR
125.0000 mg | Freq: Once | INTRAMUSCULAR | Status: AC
  Administered 2012-10-22: 125 mg via INTRAVENOUS
  Filled 2012-10-22: qty 2

## 2012-10-22 MED ORDER — ASPIRIN 81 MG PO CHEW
324.0000 mg | CHEWABLE_TABLET | Freq: Once | ORAL | Status: AC
  Administered 2012-10-22: 324 mg via ORAL
  Filled 2012-10-22: qty 4

## 2012-10-22 MED ORDER — KETOROLAC TROMETHAMINE 30 MG/ML IJ SOLN
30.0000 mg | Freq: Once | INTRAMUSCULAR | Status: AC
  Administered 2012-10-22: 30 mg via INTRAVENOUS
  Filled 2012-10-22: qty 1

## 2012-10-22 NOTE — ED Notes (Signed)
Pt report given to Huntley Dec, RN with [facility].

## 2012-10-22 NOTE — ED Notes (Signed)
Pt states that pain comes and goes but still has some discomfort and burning. [facility] aware and calling regarding bed placement.

## 2012-10-22 NOTE — ED Notes (Signed)
PA at bedside.

## 2012-10-22 NOTE — ED Notes (Addendum)
C/o scattered welts started approx 530pm-then started having and sensation of choking with lips swelling-also c/o CP x 2 months-pt NAD

## 2012-10-22 NOTE — ED Notes (Signed)
[facility] here for pt transport.  

## 2012-10-22 NOTE — ED Notes (Signed)
MD at bedside. 

## 2012-10-22 NOTE — ED Notes (Signed)
Pt SR on monitor, resps even and unlabored, NAD noted.

## 2012-10-22 NOTE — ED Provider Notes (Signed)
History     CSN: [id number]  Arrival date & time 10/22/12  2108   First MD Initiated Contact with Patient 10/22/12 2124      Chief Complaint  Patient presents with  . Allergic Reaction    (Consider location/radiation/quality/duration/timing/severity/associated sxs/prior treatment) Patient is a 48 y.o. male presenting with allergic reaction and chest pain. The history is provided by the patient.  Allergic Reaction The primary symptoms are  nausea, rash and urticaria. The current episode started 3 to 5 hours ago. The problem has not changed since onset.This is a new problem.  The onset of the reaction was associated with exercise.  Chest Pain The chest pain began less than 1 hour ago. Chest pain occurs intermittently. The chest pain is unchanged. The pain is associated with exertion. At its most intense, the pain is at 4/10. The severity of the pain is moderate. The quality of the pain is described as aching. The pain does not radiate. Primary symptoms include nausea. Treatments tried: benadryl. Risk factors include sedentary lifestyle. Past medical history comments: neck problems  His family medical history is significant for CAD in family.     Past Medical History  Diagnosis Date  . Asthma   . Chronic back pain   . Bilateral chronic knee pain   . Chronic shoulder pain   . Hypertension   . Reflux   . Depression     History reviewed. No pertinent past surgical history.  Family History  Problem Relation Age of Onset  . Hypertension Other   . Diabetes Other   . Heart attack Other   . Diabetes Mother   . Hypertension Mother   . Hyperlipidemia Neg Hx   . Sudden death Neg Hx     History  Substance Use Topics  . Smoking status: Never Smoker   . Smokeless tobacco: Never Used  . Alcohol Use: Yes      Review of Systems  Cardiovascular: Positive for chest pain.  Gastrointestinal: Positive for nausea.  Skin: Positive for rash.  All other systems reviewed and are  negative.    Allergies  Review of patient's allergies indicates no known allergies.  Home Medications   Current Outpatient Rx  Name  Route  Sig  Dispense  Refill  . ALBUTEROL SULFATE HFA 108 (90 BASE) MCG/ACT IN AERS   Inhalation   Inhale 1-2 puffs into the lungs every 6 (six) hours as needed for wheezing or shortness of breath.   1 Inhaler   4   . IPRATROPIUM-ALBUTEROL 18-103 MCG/ACT IN AERO   Inhalation   Inhale 2 puffs into the lungs every 6 (six) hours as needed.           . CYCLOBENZAPRINE HCL 5 MG PO TABS   Oral   Take 2 tablets (10 mg total) by mouth 3 (three) times daily as needed for muscle spasms.   30 tablet   2   . LEVALBUTEROL HCL 0.31 MG/3ML IN NEBU   Nebulization   Take 1 ampule by nebulization every 4 (four) hours as needed.         Marland Kitchen TRAMADOL HCL 50 MG PO TABS   Oral   Take 1 tablet (50 mg total) by mouth every 6 (six) hours as needed for pain.   50 tablet   1     BP 166/99  Pulse 101  Temp 98.9 F (37.2 C) (Oral)  Resp 18  Ht 6\' 1"  (1.854 m)  Wt 203 lb (92.08 kg)  BMI 26.78 kg/m2  SpO2 98%  Physical Exam  Nursing note and vitals reviewed. Constitutional: He is oriented to person, place, and time. He appears well-developed and well-nourished.  HENT:  Head: Normocephalic.  Right Ear: External ear normal.  Left Ear: External ear normal.  Nose: Nose normal.  Mouth/Throat: Oropharynx is clear and moist.  Eyes: Conjunctivae normal are normal. Pupils are equal, round, and reactive to light.  Neck: Normal range of motion.  Cardiovascular: Normal rate and regular rhythm.   Pulmonary/Chest: Effort normal and breath sounds normal.  Abdominal: Soft. Bowel sounds are normal.  Musculoskeletal: Normal range of motion.  Neurological: He is alert and oriented to person, place, and time.  Skin: Rash noted.    ED Course  Procedures (including critical care time)   Labs Reviewed  TROPONIN I  CBC WITH DIFFERENTIAL  BASIC METABOLIC PANEL    No results found.   No diagnosis found.   Date: 10/22/2012  Rate: 102  Rhythm: sinus tachycardia  QRS Axis: normal  Intervals: normal  ST/T Wave abnormalities: normal  Conduction Disutrbances:none  Narrative Interpretation:   Old EKG Reviewed: unchanged   MDM  Pt's care turned over to Dr. Freida Busman 1000 pm       Lonia Skinner [place], [place] 10/22/12 2143

## 2012-10-22 NOTE — ED Provider Notes (Signed)
Medical screening examination/treatment/procedure(s) were conducted as a shared visit with non-physician practitioner(s) and myself.  I personally evaluated the patient during the encounter   Patient seen examined. He has had substernal chest pain tonight that lasted 20-30 minutes. He has resolved. He has had multiple symptoms of chest pain over the last couple weeks. Concern for unstable angina. He'll be admitted to [facility]  Toy Baker, MD 10/22/12 2239

## 2012-10-23 ENCOUNTER — Inpatient Hospital Stay (HOSPITAL_COMMUNITY): Payer: No Typology Code available for payment source

## 2012-10-23 ENCOUNTER — Encounter (HOSPITAL_COMMUNITY): Payer: Self-pay | Admitting: Nurse Practitioner

## 2012-10-23 DIAGNOSIS — R079 Chest pain, unspecified: Secondary | ICD-10-CM

## 2012-10-23 DIAGNOSIS — T7840XA Allergy, unspecified, initial encounter: Secondary | ICD-10-CM | POA: Diagnosis present

## 2012-10-23 DIAGNOSIS — J45909 Unspecified asthma, uncomplicated: Secondary | ICD-10-CM | POA: Diagnosis present

## 2012-10-23 LAB — LIPID PANEL
LDL Cholesterol: 190 mg/dL — ABNORMAL HIGH (ref 0–99)
Triglycerides: 98 mg/dL (ref <150)
VLDL: 20 mg/dL (ref 0–40)

## 2012-10-23 LAB — BASIC METABOLIC PANEL
BUN: 14 mg/dL (ref 6–23)
CO2: 25 mEq/L (ref 19–32)
Chloride: 100 mEq/L (ref 96–112)
Creatinine, Ser: 1.33 mg/dL (ref 0.50–1.35)

## 2012-10-23 LAB — CBC
HCT: 46 % (ref 39.0–52.0)
MCV: 86 fL (ref 78.0–100.0)
Platelets: 264 10*3/uL (ref 150–400)
RBC: 5.35 MIL/uL (ref 4.22–5.81)
WBC: 8.1 10*3/uL (ref 4.0–10.5)

## 2012-10-23 LAB — TROPONIN I: Troponin I: <0.3 ng/mL (ref <0.30)

## 2012-10-23 MED ORDER — CYCLOBENZAPRINE HCL 10 MG PO TABS: 10.0000 mg | ORAL_TABLET | Freq: Three times a day (TID) | ORAL | Status: DC | PRN

## 2012-10-23 MED ORDER — SODIUM CHLORIDE 0.9 % IJ SOLN
3.0000 mL | Freq: Two times a day (BID) | INTRAMUSCULAR | Status: DC
  Administered 2012-10-24: 3 mL via INTRAVENOUS

## 2012-10-23 MED ORDER — LEVALBUTEROL HCL 0.31 MG/3ML IN NEBU: 1.0000 | INHALATION_SOLUTION | RESPIRATORY_TRACT | Status: DC | PRN

## 2012-10-23 MED ORDER — HEPARIN SODIUM (PORCINE) 5000 UNIT/ML IJ SOLN
5000.0000 [IU] | Freq: Three times a day (TID) | INTRAMUSCULAR | Status: DC
  Administered 2012-10-23 – 2012-10-24 (×3): 5000 [IU] via SUBCUTANEOUS
  Filled 2012-10-23 (×7): qty 1

## 2012-10-23 MED ORDER — ALBUTEROL SULFATE HFA 108 (90 BASE) MCG/ACT IN AERS: 1.0000 | INHALATION_SPRAY | Freq: Four times a day (QID) | RESPIRATORY_TRACT | Status: DC | PRN

## 2012-10-23 MED ORDER — PNEUMOCOCCAL VAC POLYVALENT 25 MCG/0.5ML IJ INJ
0.5000 mL | INJECTION | INTRAMUSCULAR | Status: DC
  Filled 2012-10-23: qty 0.5

## 2012-10-23 MED ORDER — TRAMADOL HCL 50 MG PO TABS
50.0000 mg | ORAL_TABLET | Freq: Four times a day (QID) | ORAL | Status: DC | PRN
  Administered 2012-10-23: 50 mg via ORAL
  Filled 2012-10-23: qty 1

## 2012-10-23 MED ORDER — IPRATROPIUM-ALBUTEROL 18-103 MCG/ACT IN AERO: 2.0000 | INHALATION_SPRAY | Freq: Four times a day (QID) | RESPIRATORY_TRACT | Status: DC | PRN

## 2012-10-23 MED ORDER — INFLUENZA VIRUS VACC SPLIT PF IM SUSP
0.5000 mL | INTRAMUSCULAR | Status: DC
  Filled 2012-10-23: qty 0.5

## 2012-10-23 MED ORDER — SODIUM CHLORIDE 0.9 % IV SOLN
INTRAVENOUS | Status: DC
  Administered 2012-10-24: 20 mL/h via INTRAVENOUS
  Administered 2012-10-24: 500 mL via INTRAVENOUS

## 2012-10-23 MED ORDER — PANTOPRAZOLE SODIUM 40 MG PO TBEC: 40.0000 mg | DELAYED_RELEASE_TABLET | Freq: Every day | ORAL | Status: DC

## 2012-10-23 MED ORDER — GI COCKTAIL ~~LOC~~
30.0000 mL | Freq: Once | ORAL | Status: AC
  Administered 2012-10-23: 30 mL via ORAL
  Filled 2012-10-23: qty 30

## 2012-10-23 MED ORDER — LEVALBUTEROL HCL 0.63 MG/3ML IN NEBU
0.6300 mg | INHALATION_SOLUTION | RESPIRATORY_TRACT | Status: DC | PRN
  Filled 2012-10-23: qty 3

## 2012-10-23 NOTE — Consult Note (Signed)
CARDIOLOGY CONSULT NOTE  Patient ID: Javier Beasley MRN: [id number], DOB/AGE: 10-02-1965   Admit date: 10/22/2012 Date of Consult: 10/23/2012  Primary Cardiologist: new to  [facility] - seen by B. Jens Som, MD  Pt. Profile  47 y/o male w/o prior cardiac hx who presents with chest pain.  Problem List  Past Medical History  Diagnosis Date  . Asthma   . Chronic back pain   . Bilateral chronic knee pain   . Chronic shoulder pain   . Hypertension   . Reflux   . Depression     History reviewed. No pertinent past surgical history.   Allergies  No Known Allergies  HPI   47 y/o male without prior cardiac hx.  Over the past 2 months, he has been experiencing intermittent post-prandial chest discomfort that "feels like the food is stuck" and is often associated with belching, pain, nausea, dry heaving, or small volume vomiting.  He has tried OTC antacids but hasn't seen much difference in Ss.  Starting 2-3 wks ago, he also began to note intermittent rest and exertional left sided chest discomfort described as  "a paper cut" or shooting pain, with pressure and occasional palpitations, dyspnea, or wheezing, lasting 6-7 minutes and resolving spontaneously.  Yesterday evening, after eating fish and shrimp - which he commonly eats -  he developed flushing and hives with itching of his scalp, presumed to be 2/2 an allergic rxn.  He went home and took a benadryl with improvement in hives but then started to have chest discomfort - both the central/gerd-like as well as the sharp shooting type.  He presented to the [facility] and reported his c/p history and was subsequently transferred to [facility] for r/o and eval.  Here, CE are negative and ECG is non-acute.  Chest pain has improved w/ GI cocktail.  Barium swallow suggests possible Barrett's Esophagus.  Due to FH, we have been asked to eval.  He has had intermittent discomfort while here, lasting 10 mins and resolving spontaneously.  Inpatient  Medications     . heparin  5,000 Units Subcutaneous Q8H  . influenza  inactive virus vaccine  0.5 mL Intramuscular Tomorrow-1000  . pneumococcal 23 valent vaccine  0.5 mL Intramuscular Tomorrow-1000  . sodium chloride  3 mL Intravenous Q12H   Family History Family History  Problem Relation Age of Onset  . Hypertension Other   . Diabetes Other   . Heart attack Other   . Diabetes Mother   . Hypertension Mother   . Hyperlipidemia Neg Hx   . Sudden death Neg Hx     Social History History   Social History  . Marital Status: Divorced    Spouse Name: N/A    Number of Children: N/A  . Years of Education: N/A   Occupational History  . Not on file.   Social History Main Topics  . Smoking status: Never Smoker   . Smokeless tobacco: Never Used  . Alcohol Use: Yes     Comment: 1 beer/night  . Drug Use: Yes    Special: Marijuana     Comment: daily MJ use for pain control.  . Sexually Active: Not on file   Other Topics Concern  . Not on file   Social History Narrative   Lives in [place] with significant other.    Review of Systems  General:  No chills, fever, night sweats or weight changes.  Cardiovascular:  +++ Chest pain, dyspnea on exertion, palpitations. No edema, orthopnea,  palpitations, paroxysmal nocturnal dyspnea. Dermatological: No rash, lesions/masses Respiratory: No cough, occas dyspnea as above. Urologic: No hematuria, dysuria Abdominal:  Occasional nausea, vomiting. No diarrhea, bright red blood per rectum, melena, or hematemesis Neurologic:  No visual changes, wkns, changes in mental status. All other systems reviewed and are otherwise negative except as noted above.  Physical Exam  Blood pressure 135/81, pulse 75, temperature 98 F (36.7 C), temperature source Oral, resp. rate 18, height 6\' 1"  (1.854 m), weight 206 lb 3.2 oz (93.532 kg), SpO2 97.00%.  General: Pleasant, NAD Psych: Normal affect. Neuro: Alert and oriented X 3. Moves all extremities  spontaneously. HEENT: Normal  Neck: Supple without bruits or JVD. Lungs:  Resp regular and unlabored, CTA, diminished in bilateral bases. Heart: RRR no s3, s4, or murmurs. Abdomen: Soft, non-tender, non-distended, BS + x 4.  Extremities: No clubbing, cyanosis or edema. DP/PT/Radials 2+ and equal bilaterally.  Labs   Basename 10/23/12 0755 10/23/12 0130 10/22/12 2131  CKTOTAL -- -- --  CKMB -- -- --  TROPONINI <0.30 <0.30 <0.30   Lab Results  Component Value Date   WBC 8.1 10/23/2012   HGB 15.8 10/23/2012   HCT 46.0 10/23/2012   MCV 86.0 10/23/2012   PLT 264 10/23/2012     Lab 10/23/12 0752  NA 138  K 4.0  CL 100  CO2 25  BUN 14  CREATININE 1.33  CALCIUM 9.6  PROT --  BILITOT --  ALKPHOS --  ALT --  AST --  GLUCOSE 150*   Lab Results  Component Value Date   CHOL 264* 10/23/2012   HDL 54 10/23/2012   LDLCALC 914* 10/23/2012   TRIG 98 10/23/2012   Radiology/Studies  Dg Chest 2 View  10/22/2012  *RADIOLOGY REPORT*  Clinical Data: Complaining of allergic reaction and chest pain. Asthma and hypertension.  CHEST - 2 VIEW  Comparison: None.  Findings: Mild central pulmonary vascular prominence.  Minimal peribronchial thickening may be normal versus minimal reactive changes.  No infiltrate, congestive heart failure or pneumothorax.  Question prominent nipple shadow overlying the right sixth rib. This can be confirmed with nipple marker view.  Minimal degenerative changes lower thoracic spine.  Heart size within normal limits.  IMPRESSION: Mild central pulmonary vascular prominence.  Minimal peribronchial thickening may be normal versus minimal reactive changes.  No infiltrate, congestive heart failure or pneumothorax.  Question prominent nipple shadow overlying the right sixth rib. This can be confirmed with nipple marker view.   Original Report Authenticated By: Lacy Duverney, M.D.    ECG  Rsr, 71, lvh/early repol, no acute st/t changes.  ASSESSMENT AND PLAN  1. Chest pain: Presents  with both typical and atypical symptoms though symptoms sound to be predominantly GI in nature.  GI seeing with plan for EGD tomorrow.  No objective evidence of ischemia with nl Troponins and no acute ECG changes.  Given significant family history of CAD at a young age, we will arrange for an outpt [facility].  ECG suggests LVH and pressures were elevated earlier in admission.  Continue to trend with a low-threshold to initiate a diuretic (chlorthalidone).  2.  HTN:  Follow.  May require diuretic.  3.  HL:  LDL 190.  Per current guidelines with FH, would recommend initiation - defer to primary care.  Signed, Nicolasa Ducking, NP 10/23/2012, 3:54 PM  As above, patient seen and examined. Briefly patient is a 47 year old male with past medical history of borderline hypertension, gastroesophageal reflux disease and asthma who we are  asked to evaluate for chest pain. Over the past 3 weeks he notes dysphasia. He developed a "soreness" when his food gets stuck. He also has had occasional sharp pain in the left chest area that lasts 2 minutes. His symptoms are not exertional. He denies dyspnea on exertion, orthopnea, PND, pedal edema, syncope or exertional chest pain. His cardiac markers are negative. Electrocardiogram shows no ST changes. LDL is 190. His symptoms are very atypical and unlikely to be cardiac in etiology. He has been seen by gastroenterology an EGD is scheduled. Once his GI evaluation is complete he can be discharged from a cardiac standpoint and we will arrange an outpatient functional study for risk stratification. We would recommend a statin long-term for his hyperlipidemia but I will leave this to primary care. Olga Millers 4:47 PM

## 2012-10-23 NOTE — Progress Notes (Signed)
Pt c/o of sharp CP and states "it feels like there's food that just won't go down". EKG obtained, pt in NSR. VSS, will continue to monitor.

## 2012-10-23 NOTE — Progress Notes (Signed)
Utilization Review Completed.Javier Beasley T2/02/2013   

## 2012-10-23 NOTE — Progress Notes (Signed)
[facility] PROGRESS NOTE  Javier Beasley AOZ:[id number] DOB: 07/31/1966 DOA: 10/22/2012 PCP: Pcp Not In System  Brief narrative 47 year old male with history of HTN and asthma was admitted on 2/6 with history of intermittent atypical chest pain of 2 months duration which got worse on day of admission, intermittent dysphagia to solids and skin rash with pruritus that started approximately 2 hours after a seafood meal. Patient was admitted for further evaluation and management  Assessment/Plan: 1. Atypical chest pain: Patient has features suggestive of GI, musculoskeletal and possible cardiac etiology. Cardiology consulted for assistance with evaluation. Abnormal barium swallow. [facility] consulted and plan for EGD on 2/7. PPIs. 2. Acute renal failure: Unclear etiology. Improving. Oral hydration. Followup BMP in a.m. 3. Asthma: Stable 4. Allergic reaction: Likely to seafood-patient ate some fish and shrimp. Resolved  Code Status: Full Family Communication: Discussed with girlfriend at bedside  Disposition Plan: Home, possibly 2/7 after EGD   Consultants:  [facility]  [facility]  Procedures:  Barium swallow  Antibiotics:  None   HPI/Subjective: Complains of cramping upper abdominal discomfort. Dysphagia. Denies dyspnea  Objective: Filed Vitals:   10/22/12 2335 10/23/12 0045 10/23/12 0444 10/23/12 1500  BP: 140/84 139/83 131/88 135/81  Pulse:  71 74 75  Temp:  98.3 F (36.8 C) 98.1 F (36.7 C) 98 F (36.7 C)  TempSrc:  Oral Oral Oral  Resp: 15 18 18 18   Height:      Weight:  93.532 kg (206 lb 3.2 oz)    SpO2: 100% 99% 98% 97%    Intake/Output Summary (Last 24 hours) at 10/23/12 1620 Last data filed at 10/23/12 0800  Gross per 24 hour  Intake      0 ml  Output      0 ml  Net      0 ml   Filed Weights   10/22/12 2123 10/23/12 0045  Weight: 92.08 kg (203 lb) 93.532 kg (206 lb 3.2 oz)    Exam:   General exam: Comfortable.  Respiratory system:  Clear. No increased work of breathing.  Cardiovascular system: S1 and S2 heard, RRR. No JVD, murmurs or pedal edema.  Gastrointestinal system: Abdomen is nondistended, soft and nontender. Normal bowel sounds heard.  Central nervous system: Alert and oriented. No focal neurological deficits.   Data Reviewed: Basic Metabolic Panel:  Lab 10/23/12 9629 10/22/12 2131  NA 138 140  K 4.0 3.7  CL 100 99  CO2 25 26  GLUCOSE 150* 98  BUN 14 14  CREATININE 1.33 1.60*  CALCIUM 9.6 10.2  MG -- --  PHOS -- --   Liver Function Tests: No results found for this basename: AST:5,ALT:5,ALKPHOS:5,BILITOT:5,PROT:5,ALBUMIN:5 in the last 168 hours No results found for this basename: LIPASE:5,AMYLASE:5 in the last 168 hours No results found for this basename: AMMONIA:5 in the last 168 hours CBC:  Lab 10/23/12 0752 10/22/12 2131  WBC 8.1 9.3  NEUTROABS -- 7.4  HGB 15.8 16.5  HCT 46.0 48.3  MCV 86.0 85.9  PLT 264 281   Cardiac Enzymes:  Lab 10/23/12 1508 10/23/12 0755 10/23/12 0130 10/22/12 2131  CKTOTAL -- -- -- --  CKMB -- -- -- --  CKMBINDEX -- -- -- --  TROPONINI <0.30 <0.30 <0.30 <0.30   BNP (last 3 results) No results found for this basename: PROBNP:3 in the last 8760 hours CBG: No results found for this basename: GLUCAP:5 in the last 168 hours  No results found for this or any previous visit (from the  past 240 hour(s)).   Studies: Dg Chest 1 View  10/23/2012  *RADIOLOGY REPORT*  Clinical Data: Prominent nipple shadow versus discrete nodule  CHEST - 1 VIEW  Comparison: Chest radiograph - 10/22/2012  Findings:  Unchanged cardiac silhouette and mediastinal contours.  Previously described nodular shadow correlates with a nipple shadow as confirmed with repeat radiograph with nipple markers.  Grossly unchanged mild diffuse thickening of the pulmonary interstitium, most conspicuous of bilateral pulmonary hila. No focal airspace opacity.  No pleural effusion or pneumothorax. Unchanged  bones.  IMPRESSION: 1.  Previously questioned a possible nodule overlying the right lower lung is confirmed to represent a nipple shadow with repeat examination with nipple markers. 2.  Findings suggestive of airways disease.  No focal airspace opacities to suggest pneumonia.   Original Report Authenticated By: Tacey Ruiz, MD    Dg Chest 2 View  10/22/2012  *RADIOLOGY REPORT*  Clinical Data: Complaining of allergic reaction and chest pain. Asthma and hypertension.  CHEST - 2 VIEW  Comparison: None.  Findings: Mild central pulmonary vascular prominence.  Minimal peribronchial thickening may be normal versus minimal reactive changes.  No infiltrate, congestive heart failure or pneumothorax.  Question prominent nipple shadow overlying the right sixth rib. This can be confirmed with nipple marker view.  Minimal degenerative changes lower thoracic spine.  Heart size within normal limits.  IMPRESSION: Mild central pulmonary vascular prominence.  Minimal peribronchial thickening may be normal versus minimal reactive changes.  No infiltrate, congestive heart failure or pneumothorax.  Question prominent nipple shadow overlying the right sixth rib. This can be confirmed with nipple marker view.   Original Report Authenticated By: Lacy Duverney, M.D.    Dg Esophagus  10/23/2012  *RADIOLOGY REPORT*  Clinical Data: Dysphagia.  ESOPHAGUS/BARIUM SWALLOW/TABLET STUDY  Technique: Double contrast upper GI exam was performed.  Comparison:  10/23/2012  Findings: The pharyngeal phase of swallowing appears normal. Cervical spondylosis noted at C4-5 and C5-6.  Double contrast esophagram demonstrates normal primary peristaltic waves.  Initial "feline esophagus" appearance can be associated with esophagitis.  On series 30, focal chondral narrowing of the distal esophagus is present about 6 cm proximal to the gastroesophageal junction.  This was less pronounced on some other images of the esophagus.  Esophageal contour appears otherwise  unremarkable.  No gastric or proximal duodenal abnormality observed. A 13 mm barium tablet passed without difficulty into the stomach.  IMPRESSION:  1.  Small focal impression along the anterior margin of the distal esophagus appears to be transient could represent an esophageal varix.  On some images, the appearance resembles Barrett's esophagus, and given the mild "feline" appearance of the esophagus on early double contrast images, endoscopy may be prudent to in order to assess for esophagitis or further evidence to support Barrett's esophagus.   Original Report Authenticated By: Gaylyn Rong, M.D.     Scheduled Meds:    . heparin  5,000 Units Subcutaneous Q8H  . influenza  inactive virus vaccine  0.5 mL Intramuscular Tomorrow-1000  . pneumococcal 23 valent vaccine  0.5 mL Intramuscular Tomorrow-1000  . sodium chloride  3 mL Intravenous Q12H   Continuous Infusions:   Principal Problem:  *Chest pain    Time spent: 25 minutes    [facility]  [facility] Pager [phone]. If 8PM-8AM, please contact night-coverage at [email or web address], password [facility] 10/23/2012, 4:20 PM  LOS: 1 day

## 2012-10-23 NOTE — H&P (Signed)
[facility] History and Physical  Javier Beasley ZOX:[id number] DOB: 06-13-1966 DOA: 10/22/2012  Referring physician: ED PCP: Pcp Not In System  Specialists: None  Chief Complaint: Allergic reaction, chest pain  HPI: Javier Beasley is a 47 y.o. male who presented to the ED initially with an allergic reaction (rash urticaria) cause unknown, but also noted that he had been having chest pain: onset about a month ago, intermittent in frequency.  His chest pain is worse with eating, better at rest, he has not really associated it with exertion because he dosent do much in the way of arobic exercise due to arthritis issues (although he did do 20 pushups last night without setting off the chest pain).  Of concern however, he has a strong family history including 2 siblings that have already had MIs in their 76s.  In the ED at [facility] his troponin was noted to be negative, the allergic reaction resolved with benadryl and solumedrol, he was transferred to [facility] for admission as a cardiac ruleout given concern for unstable angina.  Review of Systems: 12 systems reviewed and otherwise negative.  Past Medical History  Diagnosis Date  . Asthma   . Chronic back pain   . Bilateral chronic knee pain   . Chronic shoulder pain   . Hypertension   . Reflux   . Depression    History reviewed. No pertinent past surgical history. Social History:  reports that he has never smoked. He has never used smokeless tobacco. He reports that he drinks alcohol. He reports that he uses illicit drugs (Marijuana).   No Known Allergies  Family History  Problem Relation Age of Onset  . Hypertension Other   . Diabetes Other   . Heart attack Other   . Diabetes Mother   . Hypertension Mother   . Hyperlipidemia Neg Hx   . Sudden death Neg Hx     Prior to Admission medications   Medication Sig Start Date End Date Taking? Authorizing Provider  albuterol (PROVENTIL HFA;VENTOLIN HFA) 108 (90 BASE) MCG/ACT inhaler Inhale  1-2 puffs into the lungs every 6 (six) hours as needed for wheezing or shortness of breath. 09/29/12 09/29/13  Clanford Cyndie Mull, MD  albuterol-ipratropium (COMBIVENT) 18-103 MCG/ACT inhaler Inhale 2 puffs into the lungs every 6 (six) hours as needed.      Historical Provider, MD  cyclobenzaprine (FLEXERIL) 5 MG tablet Take 2 tablets (10 mg total) by mouth 3 (three) times daily as needed for muscle spasms. 09/29/12   Clanford Cyndie Mull, MD  levalbuterol (XOPENEX) 0.31 MG/3ML nebulizer solution Take 1 ampule by nebulization every 4 (four) hours as needed.    Historical Provider, MD  traMADol (ULTRAM) 50 MG tablet Take 1 tablet (50 mg total) by mouth every 6 (six) hours as needed for pain. 09/29/12   Clanford Cyndie Mull, MD   Physical Exam: Filed Vitals:   10/22/12 2150 10/22/12 2257 10/22/12 2335 10/23/12 0045  BP:  139/90 140/84 139/83  Pulse:    71  Temp:    98.3 F (36.8 C)  TempSrc:    Oral  Resp: 17 15 15 18   Height:      Weight:    93.532 kg (206 lb 3.2 oz)  SpO2: 100% 97% 100% 99%    General:  NAD, resting comfortably in bed Eyes: PEERLA EOMI ENT: mucous membranes moist Neck: supple w/o JVD Cardiovascular: RRR w/o MRG Respiratory: CTA B Abdomen: soft, nt, nd, bs+ Skin: no rash nor lesion Musculoskeletal: MAE, full  ROM all 4 extremities Psychiatric: normal tone and affect Neurologic: AAOx3, grossly non-focal  Labs on Admission:  Basic Metabolic Panel:  Lab 10/22/12 4540  NA 140  K 3.7  CL 99  CO2 26  GLUCOSE 98  BUN 14  CREATININE 1.60*  CALCIUM 10.2  MG --  PHOS --   Liver Function Tests: No results found for this basename: AST:5,ALT:5,ALKPHOS:5,BILITOT:5,PROT:5,ALBUMIN:5 in the last 168 hours No results found for this basename: LIPASE:5,AMYLASE:5 in the last 168 hours No results found for this basename: AMMONIA:5 in the last 168 hours CBC:  Lab 10/22/12 2131  WBC 9.3  NEUTROABS 7.4  HGB 16.5  HCT 48.3  MCV 85.9  PLT 281   Cardiac Enzymes:  Lab  10/22/12 2131  CKTOTAL --  CKMB --  CKMBINDEX --  TROPONINI <0.30    BNP (last 3 results) No results found for this basename: PROBNP:3 in the last 8760 hours CBG: No results found for this basename: GLUCAP:5 in the last 168 hours  Radiological Exams on Admission: Dg Chest 2 View  10/22/2012  *RADIOLOGY REPORT*  Clinical Data: Complaining of allergic reaction and chest pain. Asthma and hypertension.  CHEST - 2 VIEW  Comparison: None.  Findings: Mild central pulmonary vascular prominence.  Minimal peribronchial thickening may be normal versus minimal reactive changes.  No infiltrate, congestive heart failure or pneumothorax.  Question prominent nipple shadow overlying the right sixth rib. This can be confirmed with nipple marker view.  Minimal degenerative changes lower thoracic spine.  Heart size within normal limits.  IMPRESSION: Mild central pulmonary vascular prominence.  Minimal peribronchial thickening may be normal versus minimal reactive changes.  No infiltrate, congestive heart failure or pneumothorax.  Question prominent nipple shadow overlying the right sixth rib. This can be confirmed with nipple marker view.   Original Report Authenticated By: Lacy Duverney, M.D.     EKG: Independently reviewed.  Assessment/Plan Principal Problem:  *Chest pain   1. Chest pain - admitting as cardiac rule out due to concern for unstable angina, trending troponin's, tele monitor, 2d echo ordered, alternatively may represent GERD or GI ulcer since he does describe a history of dysphagia like symptoms as well.  Trying a GI cocktail to see if this helps at all.  If not and his chest pain persists, may need to transfer to SDU and titrate nitro to see if we can control it that way if it is indeed anginal pain.    Code Status: Full Code (must indicate code status--if unknown or must be presumed, indicate so) Family Communication: Spoke with wife at bedside (indicate person spoken with, if applicable,  with phone number if by telephone) Disposition Plan: Admit (indicate anticipated LOS)  Time spent: 70 min  Gaylon Melchor M. [facility] Pager [phone]  If 7PM-7AM, please contact night-coverage [email or web address] Password [email or web address] 10/23/2012, 1:12 AM

## 2012-10-23 NOTE — Progress Notes (Signed)
Pt arrived to unit via [facility]. Pt states pain is "much better", VSS, resting. Will continue to monitor.

## 2012-10-23 NOTE — ED Notes (Signed)
Pt report called and given to [place], RN on unit 2000.

## 2012-10-23 NOTE — Progress Notes (Signed)
Pt given GI cocktail at 0126. Upon reassessment, pt states he feels better and that "the medicine seems to be working. I can feel it breaking up gas bubbles". Will continue to monitor.

## 2012-10-23 NOTE — Consult Note (Signed)
[facility] Consultation Note  Referring Provider: Dr. Marcellus Scott ([facility])  Reason for Consultation:  Chest pain, dysphagia  HPI: Javier Beasley is a 47 y.o. male admitted for evaluation of chest pain and dysphagia.  Patient was in static state of GI health until about one month ago.  At that time, he began having intermittent (2/week) solid-predominant dysphagia and chest pain.  No clear association of chest pain with exertion.  Patient has history of hives, of unclear origin, but most recent bout did occur after eating seafood.  No abdominal pain, hematemesis, melena.  Has had few episodes of isolated red blood in stool.  No prior endoscopy or colonoscopy.  Strong family history of cardiac disease.  There is no known family history of colon cancer or polyps.  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   Past Medical History  Diagnosis Date  . Asthma   . Chronic back pain   . Bilateral chronic knee pain   . Chronic shoulder pain   . Hypertension   . Reflux   . Depression     History reviewed. No pertinent past surgical history.  Prior to Admission medications   Medication Sig Start Date End Date Taking? Authorizing Provider  albuterol (PROVENTIL HFA;VENTOLIN HFA) 108 (90 BASE) MCG/ACT inhaler Inhale 2 puffs into the lungs every 6 (six) hours as needed. For shortness of breath   Yes Historical Provider, MD  albuterol-ipratropium (COMBIVENT) 18-103 MCG/ACT inhaler Inhale 2 puffs into the lungs every 6 (six) hours as needed. For shortness of breath.   Yes Historical Provider, MD  cyclobenzaprine (FLEXERIL) 5 MG tablet Take 2 tablets (10 mg total) by mouth 3 (three) times daily as needed for muscle spasms. 09/29/12  Yes Clanford Cyndie Mull, MD  traMADol (ULTRAM) 50 MG tablet Take 1 tablet (50 mg total) by mouth every 6 (six) hours as needed for pain. 09/29/12  Yes Clanford Cyndie Mull, MD     Current Facility-Administered Medications  Medication Dose Route Frequency Provider Last Rate Last Dose  . albuterol (PROVENTIL HFA;VENTOLIN HFA) 108 (90 BASE) MCG/ACT inhaler 1-2 puff  1-2 puff Inhalation Q6H PRN Hillary Bow, DO      . albuterol-ipratropium (COMBIVENT) inhaler 2 puff  2 puff Inhalation Q6H PRN Hillary Bow, DO      . cyclobenzaprine (FLEXERIL) tablet 10 mg  10 mg Oral TID PRN Hillary Bow, DO      . heparin injection 5,000 Units  5,000 Units Subcutaneous Q8H Hillary Bow, DO   5,000 Units at 10/23/12 1541  . influenza  inactive virus vaccine (FLUZONE/FLUARIX) injection 0.5 mL  0.5 mL Intramuscular Tomorrow-1000 Elease Etienne, MD      . levalbuterol Pauline Aus) nebulizer solution 0.63 mg  0.63 mg Nebulization Q4H PRN Hillary Bow, DO      . pneumococcal 23 valent vaccine (PNU-IMMUNE) injection 0.5 mL  0.5 mL Intramuscular Tomorrow-1000 Elease Etienne, MD      . sodium chloride 0.9 % injection 3 mL  3 mL Intravenous Q12H Hillary Bow, DO      . traMADol Janean Sark) tablet 50 mg  50 mg Oral Q6H PRN Hillary Bow, DO   50 mg at 10/23/12 4540    Allergies as of 10/22/2012  . (No Known Allergies)    Family History  Problem Relation Age of Onset  . Hypertension Other   . Diabetes Other   . Heart attack Other   .  Diabetes Mother   . Hypertension Mother   . Hyperlipidemia Neg Hx   . Sudden death Neg Hx     History   Social History  . Marital Status: Divorced    Spouse Name: N/A    Number of Children: N/A  . Years of Education: N/A   Occupational History  . Not on file.   Social History Main Topics  . Smoking status: Never Smoker   . Smokeless tobacco: Never Used  . Alcohol Use: Yes     Comment: 1 beer/night  . Drug Use: Yes    Special: Marijuana     Comment: daily MJ use for pain control.  . Sexually Active: Not on file   Other Topics Concern  . Not on file   Social History Narrative   Lives in [place] with significant other.     Review of Systems: Positive = bold Gen: Denies any fever, chills, rigors, night sweats, anorexia, fatigue, weakness, malaise, involuntary weight loss, and sleep disorder CV: Denies chest pain, angina, palpitations, syncope, orthopnea, PND, peripheral edema, and claudication. Resp: Denies dyspnea, cough, sputum, asthma, coughing up blood. GI: Described in detail in HPI.    GU : Denies urinary burning, blood in urine, urinary frequency, urinary hesitancy, nocturnal urination, and urinary incontinence. MS: Denies joint pain or swelling.  Denies muscle weakness, cramps, atrophy.  Derm: Denies rash, itching, oral ulcerations, hives, unhealing ulcers.  Psych: Denies depression, anxiety, memory loss, suicidal ideation, hallucinations,  and confusion. Heme: Denies bruising, bleeding, and enlarged lymph nodes. Neuro:  Denies any headaches, dizziness, paresthesias. Endo:  Denies any problems with DM, thyroid, adrenal function.  Physical Exam: Vital signs in last 24 hours: Temp:  [98 F (36.7 C)-98.9 F (37.2 C)] 98 F (36.7 C) (02/06 1500) Pulse Rate:  [71-101] 75  (02/06 1500) Resp:  [15-18] 18  (02/06 1500) BP: (131-166)/(81-99) 135/81 mmHg (02/06 1500) SpO2:  [97 %-100 %] 97 % (02/06 1500) Weight:  [92.08 kg (203 lb)-93.532 kg (206 lb 3.2 oz)] 93.532 kg (206 lb 3.2 oz) (02/06 0045) Last BM Date: 10/22/12 General:   Alert,  Well-developed, well-nourished, pleasant and cooperative in NAD Head:  Normocephalic and atraumatic. Eyes:  Sclera clear, no icterus.   Conjunctiva pink. Ears:  Normal auditory acuity. Nose:  No deformity, discharge,  or lesions. Mouth:  No deformity or lesions.  Oropharynx pink & moist. Neck:  Supple; no masses or thyromegaly. Lungs:  Clear throughout to auscultation.   No wheezes, crackles, or rhonchi. No acute distress. Heart:  Regular rate and rhythm; no murmurs, clicks, rubs,  or gallops. Abdomen:  Soft, nontender and nondistended. No masses,  hepatosplenomegaly or hernias noted. Normal bowel sounds, without guarding, and without rebound.     Msk:  Symmetrical without gross deformities. Normal posture. Pulses:  Normal pulses noted. Extremities:  Without clubbing or edema. Neurologic:  Alert and  oriented x4;  grossly normal neurologically. Skin:  Intact without significant lesions or rashes. Cervical Nodes:  No significant cervical adenopathy. Psych:  Alert and cooperative. Normal mood and affect.   Lab Results:  Basename 10/23/12 0752 10/22/12 2131  WBC 8.1 9.3  HGB 15.8 16.5  HCT 46.0 48.3  PLT 264 281   BMET  Basename 10/23/12 0752 10/22/12 2131  NA 138 140  K 4.0 3.7  CL 100 99  CO2 25 26  GLUCOSE 150* 98  BUN 14 14  CREATININE 1.33 1.60*  CALCIUM 9.6 10.2   LFT No results found for this basename: PROT,ALBUMIN,AST,ALT,ALKPHOS,BILITOT,BILIDIR,IBILI  in the last 72 hours PT/INR No results found for this basename: LABPROT:2,INR:2 in the last 72 hours  Studies/Results: Dg Chest 1 View  10/23/2012  *RADIOLOGY REPORT*  Clinical Data: Prominent nipple shadow versus discrete nodule  CHEST - 1 VIEW  Comparison: Chest radiograph - 10/22/2012  Findings:  Unchanged cardiac silhouette and mediastinal contours.  Previously described nodular shadow correlates with a nipple shadow as confirmed with repeat radiograph with nipple markers.  Grossly unchanged mild diffuse thickening of the pulmonary interstitium, most conspicuous of bilateral pulmonary hila. No focal airspace opacity.  No pleural effusion or pneumothorax. Unchanged bones.  IMPRESSION: 1.  Previously questioned a possible nodule overlying the right lower lung is confirmed to represent a nipple shadow with repeat examination with nipple markers. 2.  Findings suggestive of airways disease.  No focal airspace opacities to suggest pneumonia.   Original Report Authenticated By: Tacey Ruiz, MD    Dg Chest 2 View  10/22/2012  *RADIOLOGY REPORT*  Clinical Data: Complaining  of allergic reaction and chest pain. Asthma and hypertension.  CHEST - 2 VIEW  Comparison: None.  Findings: Mild central pulmonary vascular prominence.  Minimal peribronchial thickening may be normal versus minimal reactive changes.  No infiltrate, congestive heart failure or pneumothorax.  Question prominent nipple shadow overlying the right sixth rib. This can be confirmed with nipple marker view.  Minimal degenerative changes lower thoracic spine.  Heart size within normal limits.  IMPRESSION: Mild central pulmonary vascular prominence.  Minimal peribronchial thickening may be normal versus minimal reactive changes.  No infiltrate, congestive heart failure or pneumothorax.  Question prominent nipple shadow overlying the right sixth rib. This can be confirmed with nipple marker view.   Original Report Authenticated By: Lacy Duverney, M.D.    Dg Esophagus  10/23/2012  *RADIOLOGY REPORT*  Clinical Data: Dysphagia.  ESOPHAGUS/BARIUM SWALLOW/TABLET STUDY  Technique: Double contrast upper GI exam was performed.  Comparison:  10/23/2012  Findings: The pharyngeal phase of swallowing appears normal. Cervical spondylosis noted at C4-5 and C5-6.  Double contrast esophagram demonstrates normal primary peristaltic waves.  Initial "feline esophagus" appearance can be associated with esophagitis.  On series 30, focal chondral narrowing of the distal esophagus is present about 6 cm proximal to the gastroesophageal junction.  This was less pronounced on some other images of the esophagus.  Esophageal contour appears otherwise unremarkable.  No gastric or proximal duodenal abnormality observed. A 13 mm barium tablet passed without difficulty into the stomach.  IMPRESSION:  1.  Small focal impression along the anterior margin of the distal esophagus appears to be transient could represent an esophageal varix.  On some images, the appearance resembles Barrett's esophagus, and given the mild "feline" appearance of the esophagus on  early double contrast images, endoscopy may be prudent to in order to assess for esophagitis or further evidence to support Barrett's esophagus.   Original Report Authenticated By: Gaylyn Rong, M.D.     Impression:  1.  Intermittent solid-predominant dysphagia. 2.  Chest pain. 3.  Asthma, stable at present. 4.  History hives. 5.  Overall constellation of symptoms most typical of eosinophilic esophagitis. 6.  Hematochezia, mild intermittent.  Suspect anorectal source (such as from hemorrhoids).  Plan:  1.  Cardiology consultation in progress; my understanding from discussion with the cardiologist is that outpatient evaluation will be arranged. 2.  Endoscopy tomorrow with possible biopsies, possible esophageal dilatation. 3.  Protonix. 4.  Risks (bleeding, infection, bowel perforation that could require surgery, sedation-related changes in cardiopulmonary  systems), benefits (identification and possible treatment of source of symptoms, exclusion of certain causes of symptoms), and alternatives (watchful waiting, radiographic imaging studies, empiric medical treatment) of upper endoscopy (EGD) were explained to patient/family in detail and patient wishes to proceed. 5.  Diet tonight OK, NPO after 9 a.m., with anticipated EGD tomorrow afternoon at 1pm. 6.  Anticipate patient discharge after his endoscopy tomorrow.  Patient would benefit from elective outpatient colonoscopy to evaluate his hematochezia. 7.  Will follow; thank you for the consult.    LOS: 1 day   Beuna Bolding M  10/23/2012, 4:07 PM

## 2012-10-24 ENCOUNTER — Encounter (HOSPITAL_COMMUNITY): Payer: Self-pay | Admitting: *Deleted

## 2012-10-24 ENCOUNTER — Encounter (HOSPITAL_COMMUNITY)
Admission: EM | Disposition: A | Discharge status: Home / Self Care | Payer: Self-pay | Source: Home / Self Care | Attending: Internal Medicine

## 2012-10-24 DIAGNOSIS — J45909 Unspecified asthma, uncomplicated: Secondary | ICD-10-CM

## 2012-10-24 DIAGNOSIS — R072 Precordial pain: Secondary | ICD-10-CM

## 2012-10-24 DIAGNOSIS — N179 Acute kidney failure, unspecified: Secondary | ICD-10-CM

## 2012-10-24 HISTORY — PX: BALLOON DILATION: SHX5330

## 2012-10-24 HISTORY — PX: ESOPHAGOGASTRODUODENOSCOPY: SHX5428

## 2012-10-24 LAB — BASIC METABOLIC PANEL
Calcium: 9.2 mg/dL (ref 8.4–10.5)
GFR calc Af Amer: 73 mL/min — ABNORMAL LOW (ref >90)
GFR calc non Af Amer: 63 mL/min — ABNORMAL LOW (ref >90)
Glucose, Bld: 131 mg/dL — ABNORMAL HIGH (ref 70–99)
Sodium: 137 mEq/L (ref 135–145)

## 2012-10-24 SURGERY — EGD (ESOPHAGOGASTRODUODENOSCOPY)
Anesthesia: Moderate Sedation

## 2012-10-24 MED ORDER — DIPHENHYDRAMINE HCL 50 MG/ML IJ SOLN
INTRAMUSCULAR | Status: AC
  Filled 2012-10-24: qty 1

## 2012-10-24 MED ORDER — FENTANYL CITRATE 0.05 MG/ML IJ SOLN
INTRAMUSCULAR | Status: AC
  Filled 2012-10-24: qty 2

## 2012-10-24 MED ORDER — MIDAZOLAM HCL 5 MG/ML IJ SOLN
INTRAMUSCULAR | Status: AC
  Filled 2012-10-24: qty 2

## 2012-10-24 MED ORDER — MIDAZOLAM HCL 10 MG/2ML IJ SOLN
INTRAMUSCULAR | Status: DC | PRN
  Administered 2012-10-24 (×4): 2 mg via INTRAVENOUS

## 2012-10-24 MED ORDER — FENTANYL CITRATE 0.05 MG/ML IJ SOLN
INTRAMUSCULAR | Status: DC | PRN
  Administered 2012-10-24 (×4): 25 ug via INTRAVENOUS

## 2012-10-24 MED ORDER — BUTAMBEN-TETRACAINE-BENZOCAINE 2-2-14 % EX AERO
INHALATION_SPRAY | CUTANEOUS | Status: DC | PRN
  Administered 2012-10-24: 2 via TOPICAL

## 2012-10-24 MED ORDER — PANTOPRAZOLE SODIUM 40 MG PO TBEC: 40.0000 mg | DELAYED_RELEASE_TABLET | Freq: Every day | ORAL | Status: DC

## 2012-10-24 MED ORDER — ALBUTEROL SULFATE HFA 108 (90 BASE) MCG/ACT IN AERS: 2.0000 | INHALATION_SPRAY | Freq: Four times a day (QID) | RESPIRATORY_TRACT | Status: DC | PRN

## 2012-10-24 NOTE — Progress Notes (Signed)
  Echocardiogram 2D Echocardiogram has been performed.  Cathie Beams 10/24/2012, 9:43 AM

## 2012-10-24 NOTE — Op Note (Signed)
[facility] [address] [place], [address]   ENDOSCOPY PROCEDURE REPORT  PATIENT: Javier Beasley, Javier Beasley  MR#: [id number] BIRTHDATE: 1966/02/24 , 46  yrs. old GENDER: Male ENDOSCOPIST: Willis Modena, MD REFERRED BY: PROCEDURE DATE:  10/24/2012 PROCEDURE:  EGD w/ biopsy ASA CLASS:     Class II INDICATIONS:  dysphagia, abnormal barium swallow. MEDICATIONS: Fentanyl 100 mcg IV and Versed 8 mg IV TOPICAL ANESTHETIC: Cetacaine Spray  DESCRIPTION OF PROCEDURE: After the risks benefits and alternatives of the procedure were thoroughly explained, informed consent was obtained.  The Pentax Gastroscope [id number] endoscope was introduced through the mouth and advanced to the second portion of the duodenum. Without limitations.  The instrument was slowly withdrawn as the mucosa was fully examined.     Findings: [Esophagus had diffuse linear furrows and trachealization.  Small hiatal hernia and widely patent, > 18mm, Schatzki's ring.  No mass seen.  No findings to explain the "indentation" seen in distal esophagus on esophagram.  Overall esophageal features suspicious for eosinophilic esophagitis; biopsies were obtained with cold forceps.  Esophagus otherwise normal.  Couple small proximal gastric erosions, otherwise normal stomach and pylorus.  Normal duodenum to the second portion.          The scope was then withdrawn from the patient and the procedure completed.  ENDOSCOPIC IMPRESSION:     As above.  Suspect eosinophilic esophagitis, which could explain patient's dysphagia symptoms, and might be some playing role into his chest pain.  RECOMMENDATIONS:     1.  Watch for potential complications of procedure. 2.  Await biopsy results. 3.  Will arrange outpatient follow-up with me at [facility] in the next few weeks. 4.  Outpatient cardiology stress testing, given patient's family history of cardiac troubles, is also in process. 4.  OK to discharge today from  GI perspective.  eSigned:  Willis Modena, MD 10/24/2012 1:59 PM   CC:

## 2012-10-24 NOTE — Discharge Summary (Signed)
Physician Discharge Summary  Javier Beasley:[id number] DOB: 08/09/1966 DOA: 10/22/2012  PCP: Pcp Not In System  Admit date: 10/22/2012 Discharge date: 10/24/2012  Time spent: Less than 30 minutes  Recommendations for Outpatient Follow-up:  1. With [facility] on 10/29/2012 for outpatient stress test at 9:30 AM. 2. With Dr. Nicolasa Ducking, Cardiology PA on 10/30/12 at 9 AM. 3. With Primary Medical Doctor in 1-2 weeks with labs (BMP). Patient advised. 4. With Dr. Willis Modena, GI  Discharge Diagnoses:  Principal Problem:  *Chest pain Active Problems:  Asthma  Allergic reaction   Discharge Condition: Improved and stable.  Diet recommendation: Heart healthy  Filed Weights   10/22/12 2123 10/23/12 0045  Weight: 92.08 kg (203 lb) 93.532 kg (206 lb 3.2 oz)    History of present illness:  47 year old male with history of HTN and asthma was admitted on 2/6 with history of intermittent atypical chest pain of 2 months duration which got worse on day of admission, intermittent dysphagia to solids and skin rash with pruritus that started approximately 2 hours after a seafood meal. Patient was admitted for further evaluation and management  Hospital Course:  1. Atypical chest pain: Patient has features suggestive of GI, musculoskeletal and possible cardiac etiology. Cardiology consulted. Cardiac markers were negative and EKG did not show ST changes. Cardiology indicated that his symptoms are very atypical and unlikely to be cardiac in etiology. Cardiology has arranged for outpatient stress test for risk stratification. Patient had abnormal barium swallow. GI was consulted and performed EGD with which showed esophagitis-likely eosinophilic esophagitis. GI recommended daily PPI and outpatient followup. The esophagitis may have been the primary cause for his chest pain. 2. 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 Recommend  outpatient followup with PCP in the next 1-2 weeks with repeat BMP and further evaluation as needed. Patient was advised and verbalized understanding. Also advised avoiding NSAIDs. 3. Asthma: Stable 4. Allergic reaction: Likely to seafood-patient ate some fish and shrimp. Resolved 5. Hyperlipidemia: LDL 190. We'll defer starting long-term statin to primary care physician when patient establishes such care. 6. Hypertension: Reasonably controlled but may require a diuretic as outpatient. We'll defer to his outpatient PCP.   Procedures:  Barium swallow  EGD  Consultations:  Cardiology  Gastroenterology  Discharge Exam:  Complaints No further chest or abdominal pain. Patient eager/anxious to go home.  Filed Vitals:   10/24/12 1350 10/24/12 1400 10/24/12 1410 10/24/12 1420  BP: 131/79 131/79 155/96 146/82  Pulse:      Temp:      TempSrc:      Resp: 12 14 18 10   Height:      Weight:      SpO2: 100% 100% 98% 98%    General exam: Comfortable.  Respiratory system: Clear. No increased work of breathing.  Cardiovascular system: S1 and S2 heard, RRR. No JVD, murmurs or pedal edema.  Gastrointestinal system: Abdomen is nondistended, soft and nontender. Normal bowel sounds heard.  Central nervous system: Alert and oriented. No focal neurological deficits.   Discharge Instructions      Discharge Orders    Future Appointments: Provider: Department: Dept Phone: Center:   10/29/2012 9:45 AM [facility] 2 (Nuc Treadm) [facility] SITE 3 NUCLEAR MED [phone] None   10/30/2012 9:00 AM Ok Anis, NP [facility] Main Office [place]) [phone] [facility]     Future Orders Please Complete By Expires   Diet -  low sodium heart healthy      Increase activity slowly      Call MD for:  severe uncontrolled pain      Call MD for:  difficulty breathing, headache or visual disturbances          Medication List     As of 10/24/2012  5:56 PM     STOP taking these medications         albuterol-ipratropium 18-103 MCG/ACT inhaler   Commonly known as: COMBIVENT      TAKE these medications         albuterol 108 (90 BASE) MCG/ACT inhaler   Commonly known as: PROVENTIL HFA;VENTOLIN HFA   Inhale 2 puffs into the lungs every 6 (six) hours as needed for wheezing or shortness of breath.      cyclobenzaprine 5 MG tablet   Commonly known as: FLEXERIL   Take 2 tablets (10 mg total) by mouth 3 (three) times daily as needed for muscle spasms.      pantoprazole 40 MG tablet   Commonly known as: PROTONIX   Take 1 tablet (40 mg total) by mouth daily before breakfast.      traMADol 50 MG tablet   Commonly known as: ULTRAM   Take 1 tablet (50 mg total) by mouth every 6 (six) hours as needed for pain.       Follow-up Information    Follow up with [facility]. On 10/29/2012. (9:45 AM - please arrive by 9:30 AM.  Nothing to eat after midnight.  No caffeine 2/11.)    Contact information:   [address] [place] [phone]      Follow up with Nicolasa Ducking, NP. On 10/30/2012. (9:00 AM)    Contact information:   [address] [place] [address] [phone]       Schedule an appointment as soon as possible for a visit with Primary Medical Doctor.      Schedule an appointment as soon as possible for a visit with Freddy Jaksch, MD.   Contact information:   [address] [place] [address] [phone]           The results of significant diagnostics from this hospitalization (including imaging, microbiology, ancillary and laboratory) are listed below for reference.    Significant Diagnostic Studies: Dg Chest 1 View  10/23/2012  *RADIOLOGY REPORT*  Clinical Data: Prominent nipple shadow versus discrete nodule  CHEST - 1 VIEW  Comparison: Chest radiograph - 10/22/2012  Findings:  Unchanged cardiac silhouette and mediastinal contours.  Previously described nodular shadow  correlates with a nipple shadow as confirmed with repeat radiograph with nipple markers.  Grossly unchanged mild diffuse thickening of the pulmonary interstitium, most conspicuous of bilateral pulmonary hila. No focal airspace opacity.  No pleural effusion or pneumothorax. Unchanged bones.  IMPRESSION: 1.  Previously questioned a possible nodule overlying the right lower lung is confirmed to represent a nipple shadow with repeat examination with nipple markers. 2.  Findings suggestive of airways disease.  No focal airspace opacities to suggest pneumonia.   Original Report Authenticated By: Tacey Ruiz, MD    Dg Chest 2 View  10/22/2012  *RADIOLOGY REPORT*  Clinical Data: Complaining of allergic reaction and chest pain. Asthma and hypertension.  CHEST - 2 VIEW  Comparison: None.  Findings: Mild central pulmonary vascular prominence.  Minimal peribronchial thickening may be normal versus minimal reactive changes.  No infiltrate, congestive heart failure or pneumothorax.  Question prominent nipple shadow overlying the right sixth rib. This can be confirmed with nipple marker view.  Minimal degenerative changes lower thoracic spine.  Heart size within normal limits.  IMPRESSION: Mild central pulmonary vascular prominence.  Minimal peribronchial thickening may be normal versus minimal reactive changes.  No infiltrate, congestive heart failure or pneumothorax.  Question prominent nipple shadow overlying the right sixth rib. This can be confirmed with nipple marker view.   Original Report Authenticated By: Lacy Duverney, M.D.    Dg Esophagus  10/23/2012  *RADIOLOGY REPORT*  Clinical Data: Dysphagia.  ESOPHAGUS/BARIUM SWALLOW/TABLET STUDY  Technique: Double contrast upper GI exam was performed.  Comparison:  10/23/2012  Findings: The pharyngeal phase of swallowing appears normal. Cervical spondylosis noted at C4-5 and C5-6.  Double contrast esophagram demonstrates normal primary peristaltic waves.  Initial "feline  esophagus" appearance can be associated with esophagitis.  On series 30, focal chondral narrowing of the distal esophagus is present about 6 cm proximal to the gastroesophageal junction.  This was less pronounced on some other images of the esophagus.  Esophageal contour appears otherwise unremarkable.  No gastric or proximal duodenal abnormality observed. A 13 mm barium tablet passed without difficulty into the stomach.  IMPRESSION:  1.  Small focal impression along the anterior margin of the distal esophagus appears to be transient could represent an esophageal varix.  On some images, the appearance resembles Barrett's esophagus, and given the mild "feline" appearance of the esophagus on early double contrast images, endoscopy may be prudent to in order to assess for esophagitis or further evidence to support Barrett's esophagus.   Original Report Authenticated By: Gaylyn Rong, M.D.    EGD ENDOSCOPIC IMPRESSION: As above. Suspect eosinophilic esophagitis, which could explain patient's dysphagia symptoms, and  might be some playing role into his chest pain.  Microbiology: No results found for this or any previous visit (from the past 240 hour(s)).   Labs: Basic Metabolic Panel:  Lab 10/24/12 1610 10/23/12 0752 10/22/12 2131  NA 137 138 140  K 3.5 4.0 3.7  CL 100 100 99  CO2 26 25 26   GLUCOSE 131* 150* 98  BUN 18 14 14   CREATININE 1.32 1.33 1.60*  CALCIUM 9.2 9.6 10.2  MG -- -- --  PHOS -- -- --   Liver Function Tests: No results found for this basename: AST:5,ALT:5,ALKPHOS:5,BILITOT:5,PROT:5,ALBUMIN:5 in the last 168 hours No results found for this basename: LIPASE:5,AMYLASE:5 in the last 168 hours No results found for this basename: AMMONIA:5 in the last 168 hours CBC:  Lab 10/23/12 0752 10/22/12 2131  WBC 8.1 9.3  NEUTROABS -- 7.4  HGB 15.8 16.5  HCT 46.0 48.3  MCV 86.0 85.9  PLT 264 281   Cardiac Enzymes:  Lab 10/23/12 1508 10/23/12 0755 10/23/12 0130 10/22/12 2131   CKTOTAL -- -- -- --  CKMB -- -- -- --  CKMBINDEX -- -- -- --  TROPONINI <0.30 <0.30 <0.30 <0.30   BNP: BNP (last 3 results) No results found for this basename: PROBNP:3 in the last 8760 hours CBG: No results found for this basename: GLUCAP:5 in the last 168 hours  Additional labs  Lipid panel: Cholesterol 264, triglycerides 98, HDL 54, LDL 190 and VLDL 20.    SignedMarcellus Scott  [facility] 10/24/2012, 5:56 PM

## 2012-10-24 NOTE — Progress Notes (Signed)
Plan as outlined 10/24/11 (outpatient functional study once GI eval complete); please call with questions. Olga Millers 7:05 AM

## 2012-10-24 NOTE — Interval H&P Note (Signed)
History and Physical Interval Note:  10/24/2012 1:14 PM  Javier Beasley  has presented today for surgery, with the diagnosis of chest pain, dysphagia, abnormal esophagram  The various methods of treatment have been discussed with the patient and family. After consideration of risks, benefits and other options for treatment, the patient has consented to  Procedure(s) (LRB) with comments: ESOPHAGOGASTRODUODENOSCOPY (EGD) (Left) - possible balloon dilation BALLOON DILATION (N/A) as a surgical intervention .  The patient's history has been reviewed, patient examined, no change in status, stable for surgery.  I have reviewed the patient's chart and labs.  Questions were answered to the patient's satisfaction.     Javier Beasley M  Assessment:  1. Dysphagia.  Query eosinophilic esophagitis. 2.  Chest pain, unclear significance.  Outpatient cardiology in process. 3.  Abnormal esophagram.  Plan:  1.  Endoscopy with possible esophageal biopsies, possible esophageal dilatation. 2.  Risks (bleeding, infection, bowel perforation that could require surgery, sedation-related changes in cardiopulmonary systems), benefits (identification and possible treatment of source of symptoms, exclusion of certain causes of symptoms), and alternatives (watchful waiting, radiographic imaging studies, empiric medical treatment) of upper endoscopy with possible esophageal balloon dilatation (EGD +/- DIL) were explained to patient in detail and patient wishes to proceed.

## 2012-10-25 NOTE — ED Provider Notes (Signed)
Medical screening examination/treatment/procedure(s) were performed by non-physician practitioner and as supervising physician I was immediately available for consultation/collaboration.  Shelitha Magley T Yolanda Dockendorf, MD 10/25/12 1526 

## 2012-10-27 ENCOUNTER — Encounter (HOSPITAL_COMMUNITY): Payer: Self-pay | Admitting: Gastroenterology

## 2012-10-29 ENCOUNTER — Ambulatory Visit (HOSPITAL_COMMUNITY): Payer: No Typology Code available for payment source | Attending: Cardiovascular Disease | Admitting: Radiology

## 2012-10-29 VITALS — BP 117/75 | HR 65 | Ht 73.0 in | Wt 208.0 lb

## 2012-10-29 DIAGNOSIS — R0609 Other forms of dyspnea: Secondary | ICD-10-CM | POA: Insufficient documentation

## 2012-10-29 DIAGNOSIS — R42 Dizziness and giddiness: Secondary | ICD-10-CM | POA: Insufficient documentation

## 2012-10-29 DIAGNOSIS — R0989 Other specified symptoms and signs involving the circulatory and respiratory systems: Secondary | ICD-10-CM | POA: Insufficient documentation

## 2012-10-29 DIAGNOSIS — R002 Palpitations: Secondary | ICD-10-CM | POA: Insufficient documentation

## 2012-10-29 DIAGNOSIS — R532 Functional quadriplegia: Secondary | ICD-10-CM | POA: Insufficient documentation

## 2012-10-29 DIAGNOSIS — R0789 Other chest pain: Secondary | ICD-10-CM | POA: Insufficient documentation

## 2012-10-29 DIAGNOSIS — R61 Generalized hyperhidrosis: Secondary | ICD-10-CM | POA: Insufficient documentation

## 2012-10-29 DIAGNOSIS — R079 Chest pain, unspecified: Secondary | ICD-10-CM

## 2012-10-29 DIAGNOSIS — E785 Hyperlipidemia, unspecified: Secondary | ICD-10-CM | POA: Insufficient documentation

## 2012-10-29 DIAGNOSIS — Z8249 Family history of ischemic heart disease and other diseases of the circulatory system: Secondary | ICD-10-CM | POA: Insufficient documentation

## 2012-10-29 DIAGNOSIS — I10 Essential (primary) hypertension: Secondary | ICD-10-CM | POA: Insufficient documentation

## 2012-10-29 DIAGNOSIS — R5381 Other malaise: Secondary | ICD-10-CM | POA: Insufficient documentation

## 2012-10-29 DIAGNOSIS — R55 Syncope and collapse: Secondary | ICD-10-CM | POA: Insufficient documentation

## 2012-10-29 MED ORDER — REGADENOSON 0.4 MG/5ML IV SOLN
0.4000 mg | Freq: Once | INTRAVENOUS | Status: AC
  Administered 2012-10-29: 0.4 mg via INTRAVENOUS

## 2012-10-29 MED ORDER — TECHNETIUM TC 99M SESTAMIBI GENERIC - CARDIOLITE
10.0000 | Freq: Once | INTRAVENOUS | Status: AC | PRN
  Administered 2012-10-29: 10 via INTRAVENOUS

## 2012-10-29 MED ORDER — AMINOPHYLLINE 25 MG/ML IV SOLN: 75.0000 mg | Freq: Once | INTRAVENOUS | Status: DC

## 2012-10-29 MED ORDER — TECHNETIUM TC 99M SESTAMIBI GENERIC - CARDIOLITE
30.0000 | Freq: Once | INTRAVENOUS | Status: AC | PRN
  Administered 2012-10-29: 30 via INTRAVENOUS

## 2012-10-29 MED ORDER — AMINOPHYLLINE 25 MG/ML IV SOLN
75.0000 mg | Freq: Once | INTRAVENOUS | Status: AC
  Administered 2012-10-29: 75 mg via INTRAVENOUS

## 2012-10-29 NOTE — Progress Notes (Signed)
[facility] SITE 3 NUCLEAR MED [address] [place], [place] [address] [phone]    Cardiology Nuclear Med Study  Javier Beasley is a 47 y.o. male     MRN : [id number]     DOB: June 25, 1966  Procedure Date: 10/29/2012  Nuclear Med Background Indication for Stress Test:  Evaluation for Ischemia and Post Hospital on 10/22/12 for Chest Pain, Enzymes negative  History:  10/24/12 Echo:EF=60-65% Cardiac Risk Factors: Hypertension,Lipids, and Family History CAD  Symptoms:  Chest Pain/Tightness with and without Exertion (last episode of chest discomfort was about 2-days ago), Diaphoresis, Dizziness, Fatigue, Light-Headedness, Nausea and Near Syncope   Nuclear Pre-Procedure Caffeine/Decaff Intake:  None> 12 hrs NPO After: 9:00pm   Lungs:  Clear. O2 Sat: 100% on room air. IV 0.9% NS with Angio Cath:  22g  IV Site: R Wrist x 1, tolerated well IV Started by:  Irean Hong, RN  Chest Size (in):  40 Cup Size: n/a  Height: 6\' 1"  (1.854 m)  Weight:  208 lb (94.348 kg)  BMI:  Body mass index is 27.45 kg/(m^2). Tech Comments:Unable to walk the treadmill per patient due to ruptured disc with back pain; changed to [employer].  Irean Hong, RN    Nuclear Med Study 1 or 2 day study: 1 day  Stress Test Type:  Eugenie Birks  Reading MD: Charlton Haws, MD  Order Authorizing Provider:  Olga Millers, MD  Resting Radionuclide: Technetium 51m Sestamibi  Resting Radionuclide Dose: 11.0 mCi   Stress Radionuclide:  Technetium 55m Sestamibi  Stress Radionuclide Dose: 33.0 mCi           Stress Protocol Rest HR: 65 Stress HR: 110  Rest BP: 117/75 Stress BP: 152/72  Exercise Time (min): n/a METS: n/a   Predicted Max HR: 174 bpm % Max HR: 63.22 bpm Rate Pressure Product: 21308   Dose of Adenosine (mg):  n/a Dose of Lexiscan: 0.4 mg Dose of Aminophylline:75 mg  Dose of Atropine (mg): n/a Dose of Dobutamine: n/a mcg/kg/min (at max HR)  Stress Test Technologist: Smiley Houseman, CMA-N  Nuclear  Technologist:  Domenic Polite, CNMT     Rest Procedure:  Myocardial perfusion imaging was performed at rest 45 minutes following the intravenous administration of Technetium 28m Sestamibi.  Rest ECG: SR nonspecific ST/T wave chagnes  Stress Procedure:  The patient received IV Lexiscan 0.4 mg over 15-seconds.  He c/o nausea that would not eased up and he was given aminophylline 75 mg IV with relief.  Technetium 31m Sestamibi injected at 30-seconds.  Quantitative spect images were obtained after a 45 minute delay.  Stress ECG: No significant change from baseline ECG  QPS Raw Data Images:  Normal; no motion artifact; normal heart/lung ratio. Stress Images:  Normal homogeneous uptake in all areas of the myocardium. Rest Images:  Normal homogeneous uptake in all areas of the myocardium. Subtraction (SDS):  Normal Transient Ischemic Dilatation (Normal <1.22):  1.00 Lung/Heart Ratio (Normal <0.45):  0.28  Quantitative Gated Spect Images QGS EDV:  114 ml QGS ESV:  50 ml  Impression Exercise Capacity:  Lexiscan with no exercise. BP Response:  Normal blood pressure response. Clinical Symptoms:  Dyspnea nausea and palpitations Aminophylline given ECG Impression:  No significant ST segment change suggestive of ischemia. Comparison with Prior Nuclear Study: No previous nuclear study performed  Overall Impression:  Normal stress nuclear study.  LV Ejection Fraction: 56%.  LV Wall Motion:  NL LV Function; NL Wall Motion   Charlton Haws

## 2012-10-30 ENCOUNTER — Encounter: Payer: No Typology Code available for payment source | Admitting: Nurse Practitioner

## 2012-11-03 ENCOUNTER — Ambulatory Visit (INDEPENDENT_AMBULATORY_CARE_PROVIDER_SITE_OTHER): Payer: No Typology Code available for payment source | Admitting: Physician Assistant

## 2012-11-03 ENCOUNTER — Encounter: Payer: Self-pay | Admitting: Physician Assistant

## 2012-11-03 VITALS — BP 128/78 | HR 68 | Ht 73.0 in | Wt 213.8 lb

## 2012-11-03 DIAGNOSIS — R079 Chest pain, unspecified: Secondary | ICD-10-CM

## 2012-11-03 DIAGNOSIS — E785 Hyperlipidemia, unspecified: Secondary | ICD-10-CM | POA: Insufficient documentation

## 2012-11-03 DIAGNOSIS — N189 Chronic kidney disease, unspecified: Secondary | ICD-10-CM

## 2012-11-03 DIAGNOSIS — N182 Chronic kidney disease, stage 2 (mild): Secondary | ICD-10-CM | POA: Insufficient documentation

## 2012-11-03 NOTE — Patient Instructions (Signed)
Schedule follow up with Dr. Olga Millers or Tereso Newcomer, PA-C as needed.

## 2012-11-03 NOTE — Progress Notes (Signed)
[address], [address] [place], [place]  [address] Phone: [phone], Fax:  [phone]  Date:  11/03/2012   ID:  Javier Beasley, DOB 07/04/1966, MRN [id number]  PCP:  Pcp Not In System  Primary Cardiologist:  Dr. Olga Millers     History of Present Illness: Javier Beasley is a 47 y.o. male who returns for follow up after recent admission to the hospital for chest pain.  He has a hx of HTN, GERD, asthma.  Admitted 2/5-2/7. He presented with chest pain x 2 mos that worsened on day of admission with associated pruritic skin rash after eating seafood.  MI ruled out.  EGD demonstrated likely eosinophilic esophagitis.  PPI was recommended with OP follow up.  Echo 10/24/12:  EF 60-65%, normal wall thickness, normal diastolic fxn.  Patient seen by Dr. Jens Som and OP myoview arranged.  Lexiscan Myoview 10/29/12:  EF 56%, no ischemia.  He is doing well.  Chest pain is much improved.  No dyspnea, syncope, orthopnea, PND, edema.  He sees GI 2/28.   Labs (2/14):  K 3.5, creatinine 1.32, LDL 190, Hgb 15.8   Wt Readings from Last 3 Encounters:  11/03/12 213 lb 12.8 oz (96.979 kg)  10/29/12 208 lb (94.348 kg)  10/23/12 206 lb 3.2 oz (93.532 kg)     Past Medical History  Diagnosis Date  . Asthma   . Chronic back pain   . Bilateral chronic knee pain   . Chronic shoulder pain   . Hypertension   . Reflux   . Depression   . Hx of echocardiogram     a. Echo 10/24/12:  EF 60-65%, normal wall thickness, normal diastolic fxn  . Hx of cardiovascular stress test     a. Lexiscan Myoview 10/29/12:  EF 56%, no ischemia    Current Outpatient Prescriptions  Medication Sig Dispense Refill  . albuterol (PROVENTIL HFA;VENTOLIN HFA) 108 (90 BASE) MCG/ACT inhaler Inhale 2 puffs into the lungs every 6 (six) hours as needed for wheezing or shortness of breath.  1 Inhaler  1  . cyclobenzaprine (FLEXERIL) 5 MG tablet Take 2 tablets (10 mg total) by mouth 3 (three) times daily as needed for muscle  spasms.  30 tablet  2  . Multiple Vitamin (MULTIVITAMIN) tablet Take 1 tablet by mouth daily.      . pantoprazole (PROTONIX) 40 MG tablet Take 1 tablet (40 mg total) by mouth daily before breakfast.  30 tablet  0   No current facility-administered medications for this visit.    Allergies:   No Known Allergies  Social History:  The patient  reports that he has never smoked. He has never used smokeless tobacco. He reports that  drinks alcohol. He reports that he uses illicit drugs (Marijuana).   ROS:  Please see the history of present illness.    All other systems reviewed and negative.   PHYSICAL EXAM: VS:  BP 128/78  Pulse 68  Ht 6\' 1"  (1.854 m)  Wt 213 lb 12.8 oz (96.979 kg)  BMI 28.21 kg/m2 Well nourished, well developed, in no acute distress HEENT: normal Neck: no JVD Cardiac:  normal S1, S2; RRR; no murmur Lungs:  clear to auscultation bilaterally, no wheezing, rhonchi or rales Abd: soft, nontender, no hepatomegaly Ext: no edema Skin: warm and dry Neuro:  CNs 2-12 intact, no focal abnormalities noted  ASSESSMENT AND PLAN:  1. Chest Pain:  Likely related to esophagitis.  Cardiac workup unremarkable.  No further cardiac testing  indicated.  He can follow up prn. 2. Hyperlipidemia:  He has an appt with [facility] soon.  Advised him to follow up with PCP to discuss TLC vs medical Rx. 3. Chronic Kidney Disease:  His creatinine was mildly elevated upon admission.  Advised him to follow up with PCP to monitor this as well. 4. Esophagitis:  He has follow up with GI pending. 5. Disposition:  Follow up with Dr. Olga Millers or me PRN.    Luna Glasgow, PA-C  3:45 PM 11/03/2012

## 2012-12-15 ENCOUNTER — Encounter: Payer: Self-pay | Admitting: Family Medicine

## 2012-12-15 ENCOUNTER — Ambulatory Visit (INDEPENDENT_AMBULATORY_CARE_PROVIDER_SITE_OTHER): Payer: No Typology Code available for payment source | Admitting: Family Medicine

## 2012-12-15 VITALS — BP 148/76 | HR 87 | Ht 73.0 in | Wt 211.0 lb

## 2012-12-15 DIAGNOSIS — M25561 Pain in right knee: Secondary | ICD-10-CM

## 2012-12-15 DIAGNOSIS — M545 Low back pain: Secondary | ICD-10-CM

## 2012-12-15 DIAGNOSIS — M25569 Pain in unspecified knee: Secondary | ICD-10-CM

## 2012-12-15 DIAGNOSIS — G8929 Other chronic pain: Secondary | ICD-10-CM

## 2012-12-15 DIAGNOSIS — M25562 Pain in left knee: Secondary | ICD-10-CM

## 2012-12-15 NOTE — Assessment & Plan Note (Signed)
We reviewed his MRI again today which was done about 9 months ago.  There is mild DDD and small disc bulges but nothing significant that would account for his pain in my opinion.  Has completed PT and HEP - felt worse after this.  Tried meloxicam, tramadol, tylenol, and flexeril.  Will refer to neurosurgery for their input and further recommendations.
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
## 2012-12-15 NOTE — Assessment & Plan Note (Signed)
minimal-mild DJD but would expect injection to have helped his pain more than it did if this was purely due to DJD.  Will move forward with MRI to assess for degenerative meniscal tear.  Reviewed conservative measures for DJD previously.

## 2012-12-16 ENCOUNTER — Ambulatory Visit (HOSPITAL_BASED_OUTPATIENT_CLINIC_OR_DEPARTMENT_OTHER)
Admission: RE | Admit: 2012-12-16 | Discharge: 2012-12-16 | Disposition: A | Discharge status: Home / Self Care | Payer: No Typology Code available for payment source | Source: Ambulatory Visit | Attending: Family Medicine | Admitting: Family Medicine

## 2012-12-16 DIAGNOSIS — M2240 Chondromalacia patellae, unspecified knee: Secondary | ICD-10-CM | POA: Insufficient documentation

## 2012-12-16 DIAGNOSIS — M23302 Other meniscus derangements, unspecified lateral meniscus, unspecified knee: Secondary | ICD-10-CM | POA: Insufficient documentation

## 2012-12-16 DIAGNOSIS — R269 Unspecified abnormalities of gait and mobility: Secondary | ICD-10-CM | POA: Insufficient documentation

## 2012-12-16 DIAGNOSIS — M25561 Pain in right knee: Secondary | ICD-10-CM

## 2012-12-16 DIAGNOSIS — M25569 Pain in unspecified knee: Secondary | ICD-10-CM | POA: Insufficient documentation

## 2012-12-24 ENCOUNTER — Ambulatory Visit: Payer: No Typology Code available for payment source | Attending: Family Medicine | Admitting: Rehabilitation

## 2012-12-24 DIAGNOSIS — M545 Low back pain, unspecified: Secondary | ICD-10-CM | POA: Insufficient documentation

## 2012-12-24 DIAGNOSIS — M25569 Pain in unspecified knee: Secondary | ICD-10-CM | POA: Insufficient documentation

## 2012-12-24 DIAGNOSIS — IMO0001 Reserved for inherently not codable concepts without codable children: Secondary | ICD-10-CM | POA: Insufficient documentation

## 2012-12-30 ENCOUNTER — Ambulatory Visit: Payer: No Typology Code available for payment source | Admitting: Rehabilitation

## 2013-01-01 ENCOUNTER — Ambulatory Visit: Payer: No Typology Code available for payment source | Admitting: Rehabilitation

## 2013-01-06 ENCOUNTER — Ambulatory Visit: Payer: No Typology Code available for payment source | Admitting: Rehabilitation

## 2013-01-08 ENCOUNTER — Ambulatory Visit: Payer: No Typology Code available for payment source | Admitting: Rehabilitation

## 2013-01-13 ENCOUNTER — Ambulatory Visit: Payer: No Typology Code available for payment source | Admitting: Rehabilitation

## 2013-01-15 ENCOUNTER — Ambulatory Visit: Payer: No Typology Code available for payment source | Attending: Family Medicine | Admitting: Rehabilitation

## 2013-01-15 DIAGNOSIS — M545 Low back pain, unspecified: Secondary | ICD-10-CM | POA: Insufficient documentation

## 2013-01-15 DIAGNOSIS — IMO0001 Reserved for inherently not codable concepts without codable children: Secondary | ICD-10-CM | POA: Insufficient documentation

## 2013-01-15 DIAGNOSIS — M25569 Pain in unspecified knee: Secondary | ICD-10-CM | POA: Insufficient documentation

## 2013-02-22 ENCOUNTER — Emergency Department (HOSPITAL_COMMUNITY)
Admission: EM | Admit: 2013-02-22 | Discharge: 2013-02-22 | Disposition: A | Discharge status: Home / Self Care | Payer: No Typology Code available for payment source | Attending: Emergency Medicine | Admitting: Emergency Medicine

## 2013-02-22 ENCOUNTER — Encounter (HOSPITAL_COMMUNITY): Payer: Self-pay | Admitting: *Deleted

## 2013-02-22 DIAGNOSIS — F329 Major depressive disorder, single episode, unspecified: Secondary | ICD-10-CM | POA: Insufficient documentation

## 2013-02-22 DIAGNOSIS — I10 Essential (primary) hypertension: Secondary | ICD-10-CM | POA: Insufficient documentation

## 2013-02-22 DIAGNOSIS — J45909 Unspecified asthma, uncomplicated: Secondary | ICD-10-CM | POA: Insufficient documentation

## 2013-02-22 DIAGNOSIS — H6690 Otitis media, unspecified, unspecified ear: Secondary | ICD-10-CM | POA: Insufficient documentation

## 2013-02-22 DIAGNOSIS — M549 Dorsalgia, unspecified: Secondary | ICD-10-CM | POA: Insufficient documentation

## 2013-02-22 DIAGNOSIS — J45901 Unspecified asthma with (acute) exacerbation: Secondary | ICD-10-CM

## 2013-02-22 DIAGNOSIS — F3289 Other specified depressive episodes: Secondary | ICD-10-CM | POA: Insufficient documentation

## 2013-02-22 DIAGNOSIS — M25569 Pain in unspecified knee: Secondary | ICD-10-CM | POA: Insufficient documentation

## 2013-02-22 DIAGNOSIS — R062 Wheezing: Secondary | ICD-10-CM | POA: Insufficient documentation

## 2013-02-22 DIAGNOSIS — Z79899 Other long term (current) drug therapy: Secondary | ICD-10-CM | POA: Insufficient documentation

## 2013-02-22 DIAGNOSIS — Z9189 Other specified personal risk factors, not elsewhere classified: Secondary | ICD-10-CM | POA: Insufficient documentation

## 2013-02-22 DIAGNOSIS — M25519 Pain in unspecified shoulder: Secondary | ICD-10-CM | POA: Insufficient documentation

## 2013-02-22 MED ORDER — PREDNISONE 50 MG PO TABS: 50.0000 mg | ORAL_TABLET | Freq: Every day | ORAL | Status: DC

## 2013-02-22 MED ORDER — ALBUTEROL SULFATE HFA 108 (90 BASE) MCG/ACT IN AERS
2.0000 | INHALATION_SPRAY | Freq: Four times a day (QID) | RESPIRATORY_TRACT | Status: DC
  Administered 2013-02-22: 2 via RESPIRATORY_TRACT
  Filled 2013-02-22: qty 6.7

## 2013-02-22 MED ORDER — PREDNISONE 20 MG PO TABS
60.0000 mg | ORAL_TABLET | Freq: Once | ORAL | Status: AC
  Administered 2013-02-22: 60 mg via ORAL

## 2013-02-22 MED ORDER — IPRATROPIUM BROMIDE 0.02 % IN SOLN
0.5000 mg | Freq: Once | RESPIRATORY_TRACT | Status: AC
  Administered 2013-02-22: 0.5 mg via RESPIRATORY_TRACT
  Filled 2013-02-22: qty 2.5

## 2013-02-22 MED ORDER — ALBUTEROL SULFATE (5 MG/ML) 0.5% IN NEBU
5.0000 mg | INHALATION_SOLUTION | Freq: Once | RESPIRATORY_TRACT | Status: AC
  Administered 2013-02-22: 5 mg via RESPIRATORY_TRACT
  Filled 2013-02-22: qty 1

## 2013-02-22 MED ORDER — PREDNISONE 20 MG PO TABS
50.0000 mg | ORAL_TABLET | Freq: Once | ORAL | Status: DC
  Filled 2013-02-22: qty 3

## 2013-02-22 NOTE — ED Provider Notes (Signed)
Medical screening examination/treatment/procedure(s) were performed by non-physician practitioner and as supervising physician I was immediately available for consultation/collaboration.   Mona Ayars, MD 02/22/13 1504 

## 2013-02-22 NOTE — ED Notes (Signed)
Pt reports hx of asthma, states that he feels sob and is out of albuterol inhaler, wants a refill. No resp distress noted at triage, able to speak in full sentences. spo2 96%

## 2013-02-22 NOTE — ED Provider Notes (Signed)
History     CSN: [id number]  Arrival date & time 02/22/13  1131   First MD Initiated Contact with Patient 02/22/13 1207      Chief Complaint  Patient presents with  . Asthma    (Consider location/radiation/quality/duration/timing/severity/associated sxs/prior treatment) HPI Patient presents to the emergency department with an asthma exacerbation.  Patient, states, that it started 3 days, ago.  He noticed some symptoms about a week ago, but his inhaler seemed to help until 3 days, ago, when he no longer had any further relief.  Patient denies chest pain, nausea, vomiting, fever, cough, runny nose, sore throat, headache, blurred vision, weakness, abdominal pain or syncope.  Patient, states, that nothing seems to make his condition worse.  Patient, states, that he is no longer getting relief from his inhaler.  Symptoms have been constant Past Medical History  Diagnosis Date  . Asthma   . Chronic back pain   . Bilateral chronic knee pain   . Chronic shoulder pain   . Hypertension   . Reflux   . Depression   . Hx of echocardiogram     a. Echo 10/24/12:  EF 60-65%, normal wall thickness, normal diastolic fxn  . Hx of cardiovascular stress test     a. Lexiscan Myoview 10/29/12:  EF 56%, no ischemia    Past Surgical History  Procedure Laterality Date  . Esophagogastroduodenoscopy Left 10/24/2012    Procedure: ESOPHAGOGASTRODUODENOSCOPY (EGD);  Surgeon: Willis Modena, MD;  Location: [facility] ENDOSCOPY;  Service: Endoscopy;  Laterality: Left;  possible balloon dilation  . Balloon dilation N/A 10/24/2012    Procedure: BALLOON DILATION;  Surgeon: Willis Modena, MD;  Location: [facility] ENDOSCOPY;  Service: Endoscopy;  Laterality: N/A;    Family History  Problem Relation Age of Onset  . Hypertension Other   . Diabetes Other   . Heart attack Other   . Diabetes Mother   . Hypertension Mother   . Hyperlipidemia Neg Hx   . Sudden death Neg Hx     History  Substance Use Topics  . Smoking status:  Never Smoker   . Smokeless tobacco: Never Used  . Alcohol Use: Yes     Comment: 1 beer/night      Review of Systems All other systems negative except as documented in the HPI. All pertinent positives and negatives as reviewed in the HPI. Allergies  Review of patient's allergies indicates no known allergies.  Home Medications   Current Outpatient Rx  Name  Route  Sig  Dispense  Refill  . albuterol (PROVENTIL HFA;VENTOLIN HFA) 108 (90 BASE) MCG/ACT inhaler   Inhalation   Inhale 2 puffs into the lungs every 6 (six) hours as needed for wheezing or shortness of breath.   1 Inhaler   1   . traMADol (ULTRAM) 50 MG tablet   Oral   Take 50 mg by mouth every 6 (six) hours as needed for pain.           BP 149/86  Pulse 65  Temp(Src) 98.7 F (37.1 C) (Oral)  Resp 16  SpO2 96%  Physical Exam  Nursing note and vitals reviewed. Constitutional: He is oriented to person, place, and time. He appears well-developed and well-nourished.  HENT:  Head: Normocephalic and atraumatic.  Mouth/Throat: Oropharynx is clear and moist.  Eyes: Pupils are equal, round, and reactive to light.  Neck: Normal range of motion. Neck supple.  Cardiovascular: Normal rate and regular rhythm.   Pulmonary/Chest: Effort normal. No respiratory distress. He has wheezes.  He has no rales.  Neurological: He is alert and oriented to person, place, and time.  Skin: Skin is warm and dry. No rash noted.    ED Course  Procedures (including critical care time)  Patient is currently nursing a breathing treatment.  We'll reassess once as a complete shows no acute distress on my exam it appears to be breathing in a normal fashion   2 PM-patient reassessed and no wheezing noted on exam.  Told to return here as needed.  Followup with his primary care Dr.  MDM          Jamesetta Orleans Shaunita Seney, PA-C 02/22/13 1414

## 2013-03-02 ENCOUNTER — Ambulatory Visit: Payer: No Typology Code available for payment source | Admitting: Family Medicine

## 2013-03-03 ENCOUNTER — Encounter: Payer: Self-pay | Admitting: Family Medicine

## 2013-03-03 ENCOUNTER — Ambulatory Visit (INDEPENDENT_AMBULATORY_CARE_PROVIDER_SITE_OTHER): Payer: No Typology Code available for payment source | Admitting: Family Medicine

## 2013-03-03 VITALS — BP 129/80 | HR 64 | Ht 73.0 in | Wt 205.0 lb

## 2013-03-03 DIAGNOSIS — M25569 Pain in unspecified knee: Secondary | ICD-10-CM

## 2013-03-03 DIAGNOSIS — R200 Anesthesia of skin: Secondary | ICD-10-CM

## 2013-03-03 DIAGNOSIS — M545 Low back pain: Secondary | ICD-10-CM

## 2013-03-03 DIAGNOSIS — R209 Unspecified disturbances of skin sensation: Secondary | ICD-10-CM

## 2013-03-03 DIAGNOSIS — G8929 Other chronic pain: Secondary | ICD-10-CM

## 2013-03-03 DIAGNOSIS — M25562 Pain in left knee: Secondary | ICD-10-CM

## 2013-03-03 NOTE — Patient Instructions (Addendum)
We will go ahead with ordering nerve conduction studies/EMGs on your legs. We will also refer you to the Physical Medicine and Rehabilitation doctors for evaluation and treatment. When medicaid goes through call us so we can refer you to neurosurgery for their evaluation. You've essentially exhausted all the measures we could do to make you better.

## 2013-03-04 ENCOUNTER — Encounter: Payer: Self-pay | Admitting: Family Medicine
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
## 2013-03-04 NOTE — Assessment & Plan Note (Signed)
minimal-mild DJD but would expect injection to have helped his pain more than it did if this was purely due to DJD.  MRI without evidence of meniscal tear.  Continue with PT, home exercises for this.  Consider knee brace.  Would not consider surgery unless he went 6 months of conservative protocol without improvement.

## 2013-03-04 NOTE — Assessment & Plan Note (Signed)
MRI showed mild DDD and small disc bulges but nothing significant that would account for his pain in my opinion.  We have nothing further to offer him here as he's done PT, home exercise program, meloxicam, tramadol, tylenol, flexeril.  Were referring to neurosurgery for second opinion but won't be able to go until medicaid goes through.  Will order EMGs/NCVs given persistent numbness, cramping in legs with otherwise benign MRI.  Consider referral to PM&R for their input regarding medications, other treatment modalities.

## 2013-03-16 ENCOUNTER — Encounter: Payer: Self-pay | Admitting: Physical Medicine & Rehabilitation

## 2013-03-16 ENCOUNTER — Emergency Department (INDEPENDENT_AMBULATORY_CARE_PROVIDER_SITE_OTHER)
Admission: EM | Admit: 2013-03-16 | Discharge: 2013-03-16 | Disposition: A | Discharge status: Home / Self Care | Payer: No Typology Code available for payment source | Source: Home / Self Care | Attending: Emergency Medicine | Admitting: Emergency Medicine

## 2013-03-16 ENCOUNTER — Encounter (HOSPITAL_COMMUNITY): Payer: Self-pay | Admitting: *Deleted

## 2013-03-16 DIAGNOSIS — J45901 Unspecified asthma with (acute) exacerbation: Secondary | ICD-10-CM

## 2013-03-16 DIAGNOSIS — G8929 Other chronic pain: Secondary | ICD-10-CM

## 2013-03-16 DIAGNOSIS — M549 Dorsalgia, unspecified: Secondary | ICD-10-CM

## 2013-03-16 DIAGNOSIS — J45909 Unspecified asthma, uncomplicated: Secondary | ICD-10-CM

## 2013-03-16 MED ORDER — CYCLOBENZAPRINE HCL 10 MG PO TABS: 10.0000 mg | ORAL_TABLET | Freq: Two times a day (BID) | ORAL | Status: DC | PRN

## 2013-03-16 MED ORDER — ALBUTEROL SULFATE HFA 108 (90 BASE) MCG/ACT IN AERS: 2.0000 | INHALATION_SPRAY | RESPIRATORY_TRACT | Status: DC | PRN

## 2013-03-16 MED ORDER — ALBUTEROL SULFATE (5 MG/ML) 0.5% IN NEBU
5.0000 mg | INHALATION_SOLUTION | Freq: Once | RESPIRATORY_TRACT | Status: AC
  Administered 2013-03-16: 5 mg via RESPIRATORY_TRACT

## 2013-03-16 MED ORDER — PREDNISONE 10 MG PO TABS: 20.0000 mg | ORAL_TABLET | Freq: Two times a day (BID) | ORAL | Status: DC

## 2013-03-16 MED ORDER — IPRATROPIUM BROMIDE 0.02 % IN SOLN
0.5000 mg | Freq: Once | RESPIRATORY_TRACT | Status: AC
  Administered 2013-03-16: 0.5 mg via RESPIRATORY_TRACT

## 2013-03-16 MED ORDER — ALBUTEROL SULFATE (5 MG/ML) 0.5% IN NEBU
INHALATION_SOLUTION | RESPIRATORY_TRACT | Status: AC
  Filled 2013-03-16: qty 1

## 2013-03-16 NOTE — ED Notes (Addendum)
Pt  Has  Asthma     He  Reported  A  Flair  Up  sev  Weeks  Ago  Had  To  Take  Inhaler and  steriod  Pack   He took  All  meds  And  Is  Out of   Inhaler     He  Also  Reports  Chronic  Back  /  Leg  Pain       -  denys  specefic recent injury  He  Ambulated  To  Exam room   And  Is  Sitting upright on  Exam table  Speaking in  Complete  sentances   He  Also  Reports   Symptoms  Of  Cough  And   Tightness  And  Sob         Some  Wheezing  Noted

## 2013-03-16 NOTE — ED Provider Notes (Signed)
History    CSN: [id number] Arrival date & time 03/16/13  1348  First MD Initiated Contact with Patient 03/16/13 1540     Chief Complaint  Patient presents with  . Medication Refill   (Consider location/radiation/quality/duration/timing/severity/associated sxs/prior Treatment) The history is provided by the patient. No language interpreter was used.   C/O ASTHMA ATTACK X 1 DAY NO FEVER,CHEST PAIN RAN OUT OF INHALER ALSO REQUESTS FOR NEBULIZER EQUIPMENT Past Medical History  Diagnosis Date  . Asthma   . Chronic back pain   . Bilateral chronic knee pain   . Chronic shoulder pain   . Hypertension   . Reflux   . Depression   . Hx of echocardiogram     a. Echo 10/24/12:  EF 60-65%, normal wall thickness, normal diastolic fxn  . Hx of cardiovascular stress test     a. Lexiscan Myoview 10/29/12:  EF 56%, no ischemia   Past Surgical History  Procedure Laterality Date  . Esophagogastroduodenoscopy Left 10/24/2012    Procedure: ESOPHAGOGASTRODUODENOSCOPY (EGD);  Surgeon: Willis Modena, MD;  Location: [facility] ENDOSCOPY;  Service: Endoscopy;  Laterality: Left;  possible balloon dilation  . Balloon dilation N/A 10/24/2012    Procedure: BALLOON DILATION;  Surgeon: Willis Modena, MD;  Location: [facility] ENDOSCOPY;  Service: Endoscopy;  Laterality: N/A;   Family History  Problem Relation Age of Onset  . Hypertension Other   . Diabetes Other   . Heart attack Other   . Diabetes Mother   . Hypertension Mother   . Hyperlipidemia Neg Hx   . Sudden death Neg Hx    History  Substance Use Topics  . Smoking status: Never Smoker   . Smokeless tobacco: Never Used  . Alcohol Use: Yes     Comment: 1 beer/night    Review of Systems  Constitutional: Negative.   HENT: Negative.   Eyes: Negative.   Respiratory: Positive for shortness of breath and wheezing.   Cardiovascular: Negative.   Gastrointestinal: Negative.   Endocrine: Negative.   Genitourinary: Negative.   Musculoskeletal: Positive for back  pain.       C/O CHRONIC LOW BACK PAIN  Neurological: Negative.   Hematological: Negative.   Psychiatric/Behavioral: Negative.   All other systems reviewed and are negative.    Allergies  Review of patient's allergies indicates no known allergies.  Home Medications   Current Outpatient Rx  Name  Route  Sig  Dispense  Refill  . albuterol (PROVENTIL HFA;VENTOLIN HFA) 108 (90 BASE) MCG/ACT inhaler   Inhalation   Inhale 2 puffs into the lungs every 6 (six) hours as needed for wheezing or shortness of breath.   1 Inhaler   1   . albuterol (PROVENTIL HFA;VENTOLIN HFA) 108 (90 BASE) MCG/ACT inhaler   Inhalation   Inhale 2 puffs into the lungs every 4 (four) hours as needed for wheezing.   1 Inhaler   0   . cyclobenzaprine (FLEXERIL) 10 MG tablet   Oral   Take 1 tablet (10 mg total) by mouth 2 (two) times daily as needed for muscle spasms.   20 tablet   0   . predniSONE (DELTASONE) 10 MG tablet   Oral   Take 2 tablets (20 mg total) by mouth 2 (two) times daily.   10 tablet   0   . predniSONE (DELTASONE) 50 MG tablet   Oral   Take 1 tablet (50 mg total) by mouth daily.   5 tablet   0   . traMADol (ULTRAM) 50  MG tablet   Oral   Take 50 mg by mouth every 6 (six) hours as needed for pain.          There were no vitals taken for this visit. Physical Exam  Nursing note and vitals reviewed. Constitutional: He is oriented to person, place, and time. He appears well-developed and well-nourished.  HENT:  Head: Normocephalic and atraumatic.  Mouth/Throat: Oropharynx is clear and moist.  Eyes: Conjunctivae are normal. Pupils are equal, round, and reactive to light.  Neck: Normal range of motion. Neck supple.  Cardiovascular: Normal rate, regular rhythm, normal heart sounds and intact distal pulses.   No murmur heard. Pulmonary/Chest: He is in respiratory distress. He has wheezes. He has no rales. He exhibits no tenderness.  Abdominal: Soft. Bowel sounds are normal. He  exhibits no distension and no mass. There is no tenderness.  Musculoskeletal: Normal range of motion.  Neurological: He is alert and oriented to person, place, and time. No cranial nerve deficit. He exhibits normal muscle tone. Coordination normal.  Skin: Skin is warm and dry.  Psychiatric: He has a normal mood and affect.    ED Course  Procedures (including critical care time) Labs Reviewed - No data to display No results found. 1. Asthma attack   2. Back pain, chronic     MDM    Jani Files, MD 03/16/13 [phone]

## 2013-03-17 ENCOUNTER — Ambulatory Visit: Payer: Self-pay

## 2013-03-31 ENCOUNTER — Ambulatory Visit (INDEPENDENT_AMBULATORY_CARE_PROVIDER_SITE_OTHER): Payer: No Typology Code available for payment source | Admitting: Internal Medicine

## 2013-03-31 ENCOUNTER — Encounter: Payer: Self-pay | Admitting: Internal Medicine

## 2013-03-31 VITALS — BP 127/87 | HR 66 | Temp 97.4°F | Ht 73.0 in | Wt 206.9 lb

## 2013-03-31 DIAGNOSIS — N182 Chronic kidney disease, stage 2 (mild): Secondary | ICD-10-CM

## 2013-03-31 DIAGNOSIS — J4542 Moderate persistent asthma with status asthmaticus: Secondary | ICD-10-CM

## 2013-03-31 DIAGNOSIS — E785 Hyperlipidemia, unspecified: Secondary | ICD-10-CM

## 2013-03-31 DIAGNOSIS — J45902 Unspecified asthma with status asthmaticus: Secondary | ICD-10-CM

## 2013-03-31 DIAGNOSIS — M545 Low back pain: Secondary | ICD-10-CM

## 2013-03-31 DIAGNOSIS — G8929 Other chronic pain: Secondary | ICD-10-CM

## 2013-03-31 DIAGNOSIS — R7309 Other abnormal glucose: Secondary | ICD-10-CM

## 2013-03-31 DIAGNOSIS — R739 Hyperglycemia, unspecified: Secondary | ICD-10-CM

## 2013-03-31 MED ORDER — TRAMADOL HCL 50 MG PO TABS: 50.0000 mg | ORAL_TABLET | Freq: Four times a day (QID) | ORAL | Status: DC | PRN

## 2013-03-31 MED ORDER — FLUTICASONE FUROATE-VILANTEROL 100-25 MCG/INH IN AEPB: 1.0000 | INHALATION_SPRAY | Freq: Every day | RESPIRATORY_TRACT | Status: DC

## 2013-03-31 NOTE — Assessment & Plan Note (Signed)
Today we ordered CMET, CBC, UA, and microAlb/Cr. We plan to follow up these labs as indicated.

## 2013-03-31 NOTE — Patient Instructions (Addendum)
Inhale 1 Breo blister once daily  Sign up for MAP assistance program to get Breo  Complete pulmonary functions tests  I will call about starting a lipid lowering medication  Make appointment for 4-6 weeks.  Improve diet, we will recheck hemoglobin A1c in 3 months

## 2013-03-31 NOTE — Progress Notes (Signed)
Subjective:   [name]   DOB: 1966-01-20 47 y.o.   MRN: [id number]  HPI: [name] is a 47 y.o. male with a pmhx of asthma and HLD who comes to the office today to establish care.  Of note, the patient has been having a lot of trouble with his asthma. Twice in the last month he has had to seek emergent medical attention for asthma exacerbations for which he received nebulizer and systemic steroids. The patient is using his inhaler at least twice daily. Furthermore, he reports that he wakes up 2/2 asthma attack at least 3 times a week. The patient notes that pollen, humidity changes, perfumes, and yeast make his asthma worse. The patient has lived with asthma his entire life and received many treatments over the years. Recently, he has only used albuterol inhalers because he did not have a doctor and has limited financial resources.    Past Medical History  Diagnosis Date  . Asthma   . Chronic back pain   . Bilateral chronic knee pain   . Chronic shoulder pain   . Hypertension   . Reflux   . Depression   . Hx of echocardiogram     a. Echo 10/24/12:  EF 60-65%, normal wall thickness, normal diastolic fxn  . Hx of cardiovascular stress test     a. Lexiscan Myoview 10/29/12:  EF 56%, no ischemia  . Allergy   . Hyperlipidemia    Current Outpatient Prescriptions  Medication Sig Dispense Refill  . albuterol (PROVENTIL HFA;VENTOLIN HFA) 108 (90 BASE) MCG/ACT inhaler Inhale 2 puffs into the lungs every 6 (six) hours as needed for wheezing or shortness of breath.  1 Inhaler  1  . albuterol (PROVENTIL HFA;VENTOLIN HFA) 108 (90 BASE) MCG/ACT inhaler Inhale 2 puffs into the lungs every 4 (four) hours as needed for wheezing.  1 Inhaler  0  . cyclobenzaprine (FLEXERIL) 10 MG tablet Take 1 tablet (10 mg total) by mouth 2 (two) times daily as needed for muscle spasms.  20 tablet  0  . predniSONE (DELTASONE) 50 MG tablet Take 1 tablet (50 mg total) by mouth daily.  5 tablet   0  . predniSONE (DELTASONE) 10 MG tablet Take 2 tablets (20 mg total) by mouth 2 (two) times daily.  10 tablet  0  . traMADol (ULTRAM) 50 MG tablet Take 50 mg by mouth every 6 (six) hours as needed for pain.       No current facility-administered medications for this visit.   Family History  Problem Relation Age of Onset  . Hypertension Other   . Diabetes Other   . Heart attack Other   . Diabetes Mother   . Hypertension Mother   . Hyperlipidemia Neg Hx   . Sudden death Neg Hx   . Hypertension Sister   . Stroke Sister   . Hypertension Brother   . Asthma Daughter    History   Social History  . Marital Status: Divorced    Spouse Name: N/A    Number of Children: N/A  . Years of Education: N/A   Social History Main Topics  . Smoking status: Never Smoker   . Smokeless tobacco: Never Used  . Alcohol Use: Yes     Comment: 1 beer/night  . Drug Use: Yes    Special: Marijuana     Comment: daily MJ use for pain control.  . Sexually Active: Yes    Birth Control/ Protection: Condom  Other Topics Concern  . None   Social History Narrative   Lives in [place] with significant other.   Review of Systems:  Review of Systems  Constitutional: Negative for fever, chills, weight loss and diaphoresis.  HENT: Positive for neck pain.   Eyes: Positive for blurred vision. Negative for double vision, photophobia and pain.  Respiratory: Positive for cough, shortness of breath and wheezing. Negative for hemoptysis and sputum production.   Cardiovascular: Negative for chest pain, palpitations, leg swelling and PND.  Gastrointestinal: Negative for heartburn, nausea, vomiting, diarrhea and constipation.  Genitourinary: Negative for dysuria, urgency and frequency.  Musculoskeletal: Positive for back pain and joint pain.  Skin: Negative for itching and rash.  Neurological: Negative for headaches.  Endo/Heme/Allergies: Negative for polydipsia.  Psychiatric/Behavioral: Positive for depression. The  patient has insomnia.      Objective:  Physical Exam: Filed Vitals:   03/31/13 0931  BP: 127/87  Pulse: 66  Temp: 97.4 F (36.3 C)  TempSrc: Oral  Height: 6\' 1"  (1.854 m)  Weight: 206 lb 14.4 oz (93.849 kg)  SpO2: 99%   Physical Exam  Constitutional: He is oriented to person, place, and time. He appears well-developed and well-nourished. No distress.  HENT:  Head: Normocephalic and atraumatic.  Eyes: Conjunctivae and EOM are normal. Pupils are equal, round, and reactive to light.  Neck: Normal range of motion. No JVD present. No tracheal deviation present. No thyromegaly present.  Cardiovascular: Normal rate, regular rhythm, normal heart sounds and intact distal pulses.  Exam reveals no gallop and no friction rub.   No murmur heard. Pulmonary/Chest: Effort normal and breath sounds normal. No stridor. No respiratory distress. He has no wheezes. He has no rales. He exhibits no tenderness.  Abdominal: Soft. Bowel sounds are normal. He exhibits no distension. There is no tenderness.  Lymphadenopathy:    He has no cervical adenopathy.  Neurological: He is alert and oriented to person, place, and time.  Skin: Skin is warm. He is not diaphoretic.  Psychiatric: He has a normal mood and affect. His behavior is normal.     Assessment & Plan:

## 2013-03-31 NOTE — Assessment & Plan Note (Signed)
Today we discussed starting a statin pending normal LFTs. I plan to call in a prescription for a statin once the labs are available.

## 2013-03-31 NOTE — Assessment & Plan Note (Addendum)
Assessment:  The patients episodes of SOB are likely secondary to poorly controlled asthma.  Plan:  We plan to complete PFTs to further characterize the patient's asthma. We have added Breo(R), a combined LABA and ICS, to the patients SABA(albuterol). We plan to f/u with the patient in 4-6 weeks.

## 2013-04-01 ENCOUNTER — Telehealth: Payer: Self-pay | Admitting: Internal Medicine

## 2013-04-01 LAB — CBC
MCHC: 33.5 g/dL (ref 30.0–36.0)
MCV: 85.7 fL (ref 78.0–100.0)
Platelets: 268 10*3/uL (ref 150–400)
RDW: 14.2 % (ref 11.5–15.5)
WBC: 5.6 10*3/uL (ref 4.0–10.5)

## 2013-04-01 LAB — COMPREHENSIVE METABOLIC PANEL
CO2: 28 mEq/L (ref 19–32)
Creat: 1.21 mg/dL (ref 0.50–1.35)
Glucose, Bld: 115 mg/dL — ABNORMAL HIGH (ref 70–99)
Total Bilirubin: 0.3 mg/dL (ref 0.3–1.2)

## 2013-04-01 LAB — URINALYSIS, MICROSCOPIC ONLY
Casts: NONE SEEN
Crystals: NONE SEEN
Squamous Epithelial / LPF: NONE SEEN

## 2013-04-01 LAB — URINALYSIS, ROUTINE W REFLEX MICROSCOPIC
Glucose, UA: NEGATIVE mg/dL
Nitrite: NEGATIVE
Protein, ur: NEGATIVE mg/dL
pH: 6 (ref 5.0–8.0)

## 2013-04-01 LAB — MICROALBUMIN / CREATININE URINE RATIO
Microalb Creat Ratio: 4.8 mg/g (ref 0.0–30.0)
Microalb, Ur: 1.11 mg/dL (ref 0.00–1.89)

## 2013-04-01 MED ORDER — SIMVASTATIN 40 MG PO TABS: 40.0000 mg | ORAL_TABLET | Freq: Every evening | ORAL | Status: DC

## 2013-04-01 NOTE — Telephone Encounter (Signed)
I informed the patient that his LFTs were normal and that we would like to start him on a lipid lowering medication. The patient was in agreement with this plan. Furthermore, I informed the patient that his appt for PFT is on July 25th at 9 am.

## 2013-04-02 NOTE — Progress Notes (Signed)
I saw and evaluated the patient.  I personally confirmed the key portions of the history and exam documented by Dr. Glendell Docker and I reviewed pertinent patient test results.  The assessment, diagnosis, and plan were formulated together and I agree with the documentation in the resident's note.  Javier Beasley has severe asthma and is only on a SABA.  We have given him samples for Breo which contains a LABA and an ICS.  We have requested that he follow up with the MAP program where he can continue to get this medication for free once eligibility is confirmed.  Follow up for CKD as per Dr. Louann Liv note.

## 2013-04-10 ENCOUNTER — Ambulatory Visit (HOSPITAL_COMMUNITY)
Admission: RE | Admit: 2013-04-10 | Discharge: 2013-04-10 | Disposition: A | Discharge status: Home / Self Care | Payer: No Typology Code available for payment source | Source: Ambulatory Visit | Attending: Internal Medicine | Admitting: Internal Medicine

## 2013-04-10 DIAGNOSIS — J4542 Moderate persistent asthma with status asthmaticus: Secondary | ICD-10-CM

## 2013-04-10 DIAGNOSIS — J45902 Unspecified asthma with status asthmaticus: Secondary | ICD-10-CM | POA: Insufficient documentation

## 2013-04-10 MED ORDER — ALBUTEROL SULFATE (5 MG/ML) 0.5% IN NEBU
2.5000 mg | INHALATION_SOLUTION | Freq: Once | RESPIRATORY_TRACT | Status: AC
  Administered 2013-04-10: 2.5 mg via RESPIRATORY_TRACT

## 2013-04-28 ENCOUNTER — Encounter: Payer: No Typology Code available for payment source | Admitting: Internal Medicine

## 2013-05-19 ENCOUNTER — Encounter: Payer: No Typology Code available for payment source | Attending: Physical Medicine & Rehabilitation

## 2013-05-19 ENCOUNTER — Ambulatory Visit: Payer: No Typology Code available for payment source | Admitting: Physical Medicine & Rehabilitation

## 2013-05-19 ENCOUNTER — Encounter: Payer: Self-pay | Admitting: Physical Medicine & Rehabilitation

## 2013-05-19 VITALS — BP 142/80 | HR 61 | Resp 14 | Ht 73.0 in | Wt 199.0 lb

## 2013-05-19 DIAGNOSIS — R209 Unspecified disturbances of skin sensation: Secondary | ICD-10-CM

## 2013-05-19 DIAGNOSIS — E119 Type 2 diabetes mellitus without complications: Secondary | ICD-10-CM | POA: Insufficient documentation

## 2013-05-19 DIAGNOSIS — R279 Unspecified lack of coordination: Secondary | ICD-10-CM | POA: Insufficient documentation

## 2013-05-19 DIAGNOSIS — J45909 Unspecified asthma, uncomplicated: Secondary | ICD-10-CM | POA: Insufficient documentation

## 2013-05-19 NOTE — Progress Notes (Signed)
EMG performed 05/19/2013.  See EMG report under media tab.

## 2013-05-19 NOTE — Patient Instructions (Signed)
Dr Pearletha Forge to discuss results

## 2013-05-28 ENCOUNTER — Encounter (HOSPITAL_COMMUNITY): Payer: Self-pay | Admitting: Emergency Medicine

## 2013-05-28 ENCOUNTER — Emergency Department (INDEPENDENT_AMBULATORY_CARE_PROVIDER_SITE_OTHER)
Admission: EM | Admit: 2013-05-28 | Discharge: 2013-05-28 | Disposition: A | Discharge status: Home / Self Care | Payer: No Typology Code available for payment source | Source: Home / Self Care | Attending: Emergency Medicine | Admitting: Emergency Medicine

## 2013-05-28 DIAGNOSIS — J45909 Unspecified asthma, uncomplicated: Secondary | ICD-10-CM

## 2013-05-28 MED ORDER — IPRATROPIUM BROMIDE 0.02 % IN SOLN
0.5000 mg | Freq: Once | RESPIRATORY_TRACT | Status: AC
  Administered 2013-05-28: 0.5 mg via RESPIRATORY_TRACT

## 2013-05-28 MED ORDER — METHYLPREDNISOLONE ACETATE 80 MG/ML IJ SUSP
80.0000 mg | Freq: Once | INTRAMUSCULAR | Status: AC
  Administered 2013-05-28: 80 mg via INTRAMUSCULAR

## 2013-05-28 MED ORDER — PREDNISONE 20 MG PO TABS: ORAL_TABLET | ORAL | Status: DC

## 2013-05-28 MED ORDER — BECLOMETHASONE DIPROPIONATE 80 MCG/ACT IN AERS: 2.0000 | INHALATION_SPRAY | Freq: Two times a day (BID) | RESPIRATORY_TRACT | Status: DC

## 2013-05-28 MED ORDER — ALBUTEROL SULFATE (5 MG/ML) 0.5% IN NEBU
INHALATION_SOLUTION | RESPIRATORY_TRACT | Status: AC
  Filled 2013-05-28: qty 1

## 2013-05-28 MED ORDER — ALBUTEROL SULFATE (5 MG/ML) 0.5% IN NEBU
5.0000 mg | INHALATION_SOLUTION | Freq: Once | RESPIRATORY_TRACT | Status: AC
  Administered 2013-05-28: 5 mg via RESPIRATORY_TRACT

## 2013-05-28 MED ORDER — ALBUTEROL SULFATE HFA 108 (90 BASE) MCG/ACT IN AERS: 1.0000 | INHALATION_SPRAY | Freq: Four times a day (QID) | RESPIRATORY_TRACT | Status: DC | PRN

## 2013-05-28 MED ORDER — METHYLPREDNISOLONE ACETATE 80 MG/ML IJ SUSP
INTRAMUSCULAR | Status: AC
  Filled 2013-05-28: qty 1

## 2013-05-28 NOTE — ED Notes (Addendum)
C/o medication refill.  Patient feels as if he needs a neb tx

## 2013-05-28 NOTE — ED Provider Notes (Signed)
Chief Complaint:   Chief Complaint  Patient presents with  . Medication Refill    History of Present Illness:   Javier Beasley is a 46 year old male who has had a lifelong history of asthma. Right now he is on albuterol and Breo Ellipta, although has run out of both of his medications. The patient states he's been hospitalized about 3 times with asthma but never been on a ventilator. In the past year he's been to the emergency room about twice. Right now he has some coughing and wheezing. This is worse at nighttime. Is also worse with exertion and he has to limit his activities because of wheezing. He's had some nasal congestion but no sore throat or fever.  Review of Systems:  Other than noted above, the patient denies any of the following symptoms. Systemic:  No fever, chills, sweats, fatigue, myalgias, headache, weight loss or anorexia. ENT:  No earache, ear congestion, nasal congestion, sneezing, rhinorrhea, sinus pressure, sinus pain, post nasal drip, or sore throat. Lungs:  No cough, sputum production, or shortness of breath. No chest pain. Skin:  No rash or itching.  PMFSH:  Past medical history, family history, social history, meds, and allergies were reviewed.  No history of allergic rhinitis.  No use of tobacco. He has elevated cholesterol and takes Flexeril for muscle spasm.  Physical Exam:   Vital signs:  BP 123/56  Pulse 77  Temp(Src) 97.8 F (36.6 C) (Oral)  SpO2 99% General:  Alert, in no distress. Eye:  No conjunctival injection or drainage. Lids were normal. ENT:  TMs and canals were normal, without erythema or inflammation.  Nasal mucosa was clear and uncongested, without drainage.  Mucous membranes were moist.  Pharynx was clear, without exudate or drainage.  There were no oral ulcerations or lesions. Neck:  Supple, no adenopathy, tenderness or mass. Lungs:  No retractions or use of accessory muscles.  No respiratory distress.  Lungs are most part clear with good air  movement. He does have rare scattered wheezes anteriorly and posteriorly on both sides, no rales or rhonchi. Heart:  Regular rhythm, without gallops, murmers or rubs. Skin:  Clear, warm, and dry, without rash or lesions.  Course in Urgent Care Center:   Given a breathing treatment with DuoNeb and Depo-Medrol 80 mg IM.  Assessment:  The encounter diagnosis was Asthma.  Asthma is not well controlled. The patient states that the [facility] doesn't really work well. He would like to try something else, so I gave him Qvar. Refilled his albuterol. Suggest he followup with his primary care physician within the next couple weeks.  Plan:   1.  Meds:  The following meds were prescribed:   Discharge Medication List as of 05/28/2013  5:10 PM    START taking these medications   Details  !! albuterol (PROVENTIL HFA;VENTOLIN HFA) 108 (90 BASE) MCG/ACT inhaler Inhale 1-2 puffs into the lungs every 6 (six) hours as needed for wheezing., Starting 05/28/2013, Until Discontinued, Normal    beclomethasone (QVAR) 80 MCG/ACT inhaler Inhale 2 puffs into the lungs 2 (two) times daily., Starting 05/28/2013, Until Discontinued, Normal    !! predniSONE (DELTASONE) 20 MG tablet 3 daily for 5 days, 2 daily for 5 days, 1 daily for 5 days., Normal     !! - Potential duplicate medications found. Please discuss with provider.      2.  Patient Education/Counseling:  The patient was given appropriate handouts, self care instructions, and instructed in symptomatic relief.  Given instructions  on avoidance of asthma flareups including avoidance of cigarette smoke, fumes, dust.  3.  Follow up:  The patient was told to follow up if no better in 3 to 4 days, if becoming worse in any way, and given some red flag symptoms such as worsening difficulty breathing which would prompt immediate return.  Follow up with primary care physician as soon as possible.       Reuben Likes, MD 05/28/13 [phone]

## 2013-06-23 ENCOUNTER — Ambulatory Visit (INDEPENDENT_AMBULATORY_CARE_PROVIDER_SITE_OTHER): Payer: No Typology Code available for payment source | Admitting: Internal Medicine

## 2013-06-23 ENCOUNTER — Encounter: Payer: Self-pay | Admitting: Internal Medicine

## 2013-06-23 ENCOUNTER — Encounter: Payer: No Typology Code available for payment source | Admitting: Internal Medicine

## 2013-06-23 VITALS — BP 130/87 | HR 56 | Temp 97.6°F | Wt 198.5 lb

## 2013-06-23 DIAGNOSIS — J4542 Moderate persistent asthma with status asthmaticus: Secondary | ICD-10-CM

## 2013-06-23 DIAGNOSIS — J45909 Unspecified asthma, uncomplicated: Secondary | ICD-10-CM

## 2013-06-23 DIAGNOSIS — E785 Hyperlipidemia, unspecified: Secondary | ICD-10-CM

## 2013-06-23 DIAGNOSIS — M545 Low back pain: Secondary | ICD-10-CM

## 2013-06-23 DIAGNOSIS — G8929 Other chronic pain: Secondary | ICD-10-CM

## 2013-06-23 MED ORDER — LOVASTATIN 20 MG PO TABS: 20.0000 mg | ORAL_TABLET | Freq: Every day | ORAL | Status: DC

## 2013-06-23 MED ORDER — FLUTICASONE FUROATE-VILANTEROL 100-25 MCG/INH IN AEPB: 1.0000 | INHALATION_SPRAY | Freq: Every day | RESPIRATORY_TRACT | Status: DC

## 2013-06-23 NOTE — Patient Instructions (Addendum)
Today we reviewed the report from the nerve study. It did not show any signs of nerve problems that explain your chronic pain. I recommend that you take tylenol three times a day and Advil twice a day in between the tylenol.  For your asthma, I have sent a new prescription of Breo to the health department. This medicine take a long time to help with your asthma. It helps to prevent your asthma from worsening in the future. Please let us know if it is not working for you and we will try another long acting asthma medicine.  I will call you with the results of the HIV test.  I will prescribe you a different statin called lovastatin. Please let me know if you are not tolerating it.  Myofascial Pain Syndrome Myofascial pain syndrome is a pain disorder. This pain may be felt in the muscles. It may come and go. Myofascial pain syndrome always has trigger or tender points in the muscle that will cause pain when pressed.  CAUSES Myofascial pain may be caused by injuries, especially auto accidents, or by overuse of certain muscles. Typically the pain is long lasting. It is made worse by overuse of the involved muscles, emotional distress, and by cold, damp weather. Myofascial pain syndrome often develops in patients whose response to stress is an increase in muscle tone, and is seen in greater frequency in patients with pre-existing tension headaches. SYMPTOMS  Myofascial pain syndrome causes a wide variety of symptoms. You may see tight ropy bands of muscle. Problems may also include aching, cramping, burning, numbness, tingling, and other uncomfortable sensations in muscular areas. It most commonly affects the neck, upper back, and shoulder areas. Pain often radiates into the arms and hands.  TREATMENT Treatment includes resting the affected muscular area and applying ice packs to reduce spasm and pain. Trigger point injection, is a valuable initial therapy. This therapy is an injection of local anesthetic  directly into the trigger point. Trigger points are often present at the source of pain. Pain relief following injection confirms the diagnosis of myofascial pain syndrome. Fairly vigorous therapy can be carried out during the pain-free period after each injection. Stretching exercises to loosen up the muscles are also useful. Transcutaneous electrical nerve stimulation (TENS) may provide relief from pain. TENS is the use of electric current produced by a device to stimulate the nerves. Ultrasound therapy applied directly over the affected muscle may also provide pain relief. Anti-inflammatory pain medicine can be helpful. Symptoms will gradually improve over a period of weeks to months with proper treatment. HOME CARE INSTRUCTIONS Call your caregiver for follow-up care as recommended.  SEEK MEDICAL CARE IF:  Your pain is severe and not helped with medications. Document Released: 10/11/2004 Document Revised: 11/26/2011 Document Reviewed: 10/20/2010 [facility] Patient Information 2014 [place], [place].

## 2013-06-23 NOTE — Progress Notes (Signed)
Subjective:   Patient ID: YUUKI SKEENS male   DOB: 1966-04-01 47 y.o.   MRN: [id number]  HPI: Mr.Johnie HUMZA TALLERICO is a 47 y.o. man with a onhx of asthma, chronic back pain, HTN, GERD, HLD who presents to go over results of nerve conduction study.  Please see A+P for problem based charting of chronic medical problems.    Past Medical History  Diagnosis Date  . Asthma   . Chronic back pain   . Bilateral chronic knee pain   . Chronic shoulder pain   . Hypertension   . Reflux   . Depression   . Hx of echocardiogram     a. Echo 10/24/12:  EF 60-65%, normal wall thickness, normal diastolic fxn  . Hx of cardiovascular stress test     a. Lexiscan Myoview 10/29/12:  EF 56%, no ischemia  . Allergy   . Hyperlipidemia    Current Outpatient Prescriptions  Medication Sig Dispense Refill  . albuterol (PROVENTIL HFA;VENTOLIN HFA) 108 (90 BASE) MCG/ACT inhaler Inhale 2 puffs into the lungs every 6 (six) hours as needed for wheezing or shortness of breath.  1 Inhaler  1  . albuterol (PROVENTIL HFA;VENTOLIN HFA) 108 (90 BASE) MCG/ACT inhaler Inhale 2 puffs into the lungs every 4 (four) hours as needed for wheezing.  1 Inhaler  0  . albuterol (PROVENTIL HFA;VENTOLIN HFA) 108 (90 BASE) MCG/ACT inhaler Inhale 1-2 puffs into the lungs every 6 (six) hours as needed for wheezing.  1 Inhaler  12  . beclomethasone (QVAR) 80 MCG/ACT inhaler Inhale 2 puffs into the lungs 2 (two) times daily.  1 Inhaler  2  . cyclobenzaprine (FLEXERIL) 10 MG tablet Take 1 tablet (10 mg total) by mouth 2 (two) times daily as needed for muscle spasms.  20 tablet  0  . Fluticasone Furoate-Vilanterol (BREO ELLIPTA) 100-25 MCG/INH AEPB Inhale 1 capsule into the lungs daily.  1 each  12  . predniSONE (DELTASONE) 10 MG tablet Take 2 tablets (20 mg total) by mouth 2 (two) times daily.  10 tablet  0  . predniSONE (DELTASONE) 20 MG tablet 3 daily for 5 days, 2 daily for 5 days, 1 daily for 5 days.  30 tablet  0  . predniSONE (DELTASONE)  50 MG tablet Take 1 tablet (50 mg total) by mouth daily.  5 tablet  0  . simvastatin (ZOCOR) 40 MG tablet Take 1 tablet (40 mg total) by mouth every evening.  30 tablet  10  . traMADol (ULTRAM) 50 MG tablet Take 1 tablet (50 mg total) by mouth every 6 (six) hours as needed for pain.  30 tablet  3   No current facility-administered medications for this visit.   Family History  Problem Relation Age of Onset  . Hypertension Other   . Diabetes Other   . Heart attack Other   . Diabetes Mother   . Hypertension Mother   . Hyperlipidemia Neg Hx   . Sudden death Neg Hx   . Hypertension Sister   . Stroke Sister   . Hypertension Brother   . Asthma Daughter    History   Social History  . Marital Status: Divorced    Spouse Name: N/A    Number of Children: N/A  . Years of Education: N/A   Social History Main Topics  . Smoking status: Never Smoker   . Smokeless tobacco: Never Used  . Alcohol Use: Yes     Comment: 1 beer/night  . Drug Use: Yes  Special: Marijuana     Comment: daily MJ use for pain control.  . Sexual Activity: Yes    Birth Control/ Protection: Condom   Other Topics Concern  . Not on file   Social History Narrative   Lives in [place] with significant other.   Review of Systems: No change from baseline. Constitutional: Negative for fever, chills, weight loss and diaphoresis.  HENT: Positive for neck pain.  Eyes: Positive for blurred vision. Negative for double vision, photophobia and pain.  Respiratory: Positive for cough, shortness of breath and wheezing. Negative for hemoptysis and sputum production.  Cardiovascular: Negative for chest pain, palpitations, leg swelling and PND.  Gastrointestinal: Negative for heartburn, nausea, vomiting, diarrhea and constipation.  Genitourinary: Negative for dysuria, urgency and frequency.  Musculoskeletal: Positive for back pain and joint pain.  Skin: Negative for itching and rash.  Neurological: Negative for headaches.    Endo/Heme/Allergies: Negative for polydipsia.  Psychiatric/Behavioral: Positive for depression. The patient has insomnia.    Objective:  Physical Exam: Filed Vitals:   06/23/13 1353  BP: 130/87  Pulse: 56  Temp: 97.6 F (36.4 C)  TempSrc: Oral  Weight: 198 lb 8 oz (90.039 kg)  SpO2: 97%   Physical Exam  Constitutional: He is oriented to person, place, and time. He appears well-developed and well-nourished. No distress.  HENT:  Mouth/Throat: Oropharynx is clear and moist. No oropharyngeal exudate.  Eyes: EOM are normal. Pupils are equal, round, and reactive to light.  Cardiovascular: Normal rate, regular rhythm and normal heart sounds.   Pulmonary/Chest: Effort normal.  Mild wheezing bilaterally  Abdominal: Soft. Bowel sounds are normal. He exhibits no distension. There is no tenderness.  Musculoskeletal:  tendr above L PSIS. Pain radiates down left leg.  Neurological: He is alert and oriented to person, place, and time.  Skin: He is not diaphoretic.  Psychiatric: He has a normal mood and affect. His behavior is normal.     Assessment & Plan:

## 2013-06-24 NOTE — Assessment & Plan Note (Signed)
A: Continues to use albuterol up to 3 x per day. Stopped using Breo due to not have immediate relief. I spent a long time explaining to difference between long acting meds and short acting medications. The patient voiced understanding of this and said he would be willing to restart Breo.  P: Restart Breo and re-eval at next visit.

## 2013-06-24 NOTE — Assessment & Plan Note (Signed)
A: Nerve conduction study demonstrated to sign of radiculopathy. I believe that teh patients pain is most likely myofascial. The patient states that he does not want narcotics. He does not like tramadol as it makes him sleepy. I suggested a lower dose and he did not want to try it at this time. Furthermore, he still has left over flexeril but does not like to use them due to drowsiness.   P: I recommended that the patient start a regimen of tylenol tid with advil in between. I also recommended exercise. He states that he and his gf plan to start doing yoga at the [facility]. I supported that decision.

## 2013-06-26 NOTE — Progress Notes (Signed)
I saw and evaluated the patient.  I personally confirmed the key portions of the history and exam documented by Dr. Komanski and I reviewed pertinent patient test results.  The assessment, diagnosis, and plan were formulated together and I agree with the documentation in the resident's note.  

## 2013-07-23 ENCOUNTER — Other Ambulatory Visit: Payer: Self-pay

## 2013-07-30 ENCOUNTER — Telehealth: Payer: Self-pay | Admitting: *Deleted

## 2013-07-30 MED ORDER — ALBUTEROL SULFATE HFA 108 (90 BASE) MCG/ACT IN AERS: 1.0000 | INHALATION_SPRAY | Freq: Four times a day (QID) | RESPIRATORY_TRACT | Status: DC | PRN

## 2013-07-30 NOTE — Telephone Encounter (Signed)
Returned pt's call about Albuterol refill - according to [facility], med was last refilled by Dr Algis Downs. Javier Beasley 05/28/13; instructed pt to call his office for any questions - he agreed.

## 2013-09-04 ENCOUNTER — Ambulatory Visit: Payer: Self-pay

## 2013-12-28 ENCOUNTER — Encounter: Payer: Self-pay | Admitting: Internal Medicine

## 2013-12-28 ENCOUNTER — Ambulatory Visit (INDEPENDENT_AMBULATORY_CARE_PROVIDER_SITE_OTHER): Payer: No Typology Code available for payment source | Admitting: Internal Medicine

## 2013-12-28 VITALS — BP 128/76 | HR 64 | Temp 97.9°F | Ht 73.0 in | Wt 201.2 lb

## 2013-12-28 DIAGNOSIS — N189 Chronic kidney disease, unspecified: Secondary | ICD-10-CM

## 2013-12-28 DIAGNOSIS — E785 Hyperlipidemia, unspecified: Secondary | ICD-10-CM

## 2013-12-28 DIAGNOSIS — M25519 Pain in unspecified shoulder: Secondary | ICD-10-CM

## 2013-12-28 DIAGNOSIS — R739 Hyperglycemia, unspecified: Secondary | ICD-10-CM | POA: Insufficient documentation

## 2013-12-28 DIAGNOSIS — R7309 Other abnormal glucose: Secondary | ICD-10-CM

## 2013-12-28 DIAGNOSIS — J45909 Unspecified asthma, uncomplicated: Secondary | ICD-10-CM

## 2013-12-28 LAB — LIPID PANEL
Cholesterol: 205 mg/dL — ABNORMAL HIGH (ref 0–200)
HDL: 49 mg/dL (ref >39)
LDL CALC: 136 mg/dL — AB (ref 0–99)
TRIGLYCERIDES: 102 mg/dL (ref <150)
Total CHOL/HDL Ratio: 4.2 Ratio
VLDL: 20 mg/dL (ref 0–40)

## 2013-12-28 LAB — BASIC METABOLIC PANEL WITH GFR
BUN: 11 mg/dL (ref 6–23)
CALCIUM: 9.7 mg/dL (ref 8.4–10.5)
CO2: 27 meq/L (ref 19–32)
CREATININE: 1.19 mg/dL (ref 0.50–1.35)
Chloride: 101 mEq/L (ref 96–112)
GFR, EST AFRICAN AMERICAN: 84 mL/min
GFR, Est Non African American: 72 mL/min
GLUCOSE: 113 mg/dL — AB (ref 70–99)
Potassium: 4.1 mEq/L (ref 3.5–5.3)
Sodium: 137 mEq/L (ref 135–145)

## 2013-12-28 LAB — HEMOGLOBIN A1C
Hgb A1c MFr Bld: 6.8 % — ABNORMAL HIGH (ref <5.7)
Mean Plasma Glucose: 148 mg/dL — ABNORMAL HIGH (ref <117)

## 2013-12-28 MED ORDER — FLUTICASONE-SALMETEROL 100-50 MCG/DOSE IN AEPB: 1.0000 | INHALATION_SPRAY | Freq: Two times a day (BID) | RESPIRATORY_TRACT | Status: DC

## 2013-12-28 MED ORDER — ALBUTEROL SULFATE HFA 108 (90 BASE) MCG/ACT IN AERS: 2.0000 | INHALATION_SPRAY | Freq: Four times a day (QID) | RESPIRATORY_TRACT | Status: DC | PRN

## 2013-12-28 MED ORDER — IBUPROFEN 200 MG PO TABS: 400.0000 mg | ORAL_TABLET | Freq: Three times a day (TID) | ORAL | Status: DC

## 2013-12-28 MED ORDER — LOVASTATIN 20 MG PO TABS: 20.0000 mg | ORAL_TABLET | Freq: Every day | ORAL | Status: DC

## 2013-12-28 NOTE — Assessment & Plan Note (Signed)
Patient has been non compliant with home statin. I reordered statin and I am checking lipid profile today.

## 2013-12-28 NOTE — Assessment & Plan Note (Signed)
Patients shoulder pain is consistent with AC joint injury. It is also possible that the patients shoulder pain is due to rotator cuff injury.   Patient is reticent to take medicines. He does not like to take foreign substances. WE spoke at length about various meds. He agreed to sue Advil.  I rec 400 mg Advil TID for 2 weeks. I am checking BMP today to ensure renal function is at baseline.  I referred the patient to sports med for evaluation and treatment of his shoulder pain.

## 2013-12-28 NOTE — Assessment & Plan Note (Signed)
Patient A1C is 6.4 6 months ago. Rechecking A1C today.

## 2013-12-28 NOTE — Progress Notes (Signed)
Patient ID: Hubbard Robinsondrian M Polus, male   DOB: 05/18/1966, 48 y.o.   MRN: [id number]   Subjective:   Patient ID: Hubbard Robinsondrian M Schlot[place]beck male   DOB: 10/14/1965 48 y.o.   MRN: [id number]  HPI: Mr.Larry Rueben BashM Luth is a 48 y.o. man with a pmhx of HTN HLD and asthma who presents with a cc of shoulder pain. The patient was in his normal state of health until 2-4 weeks ago when he reached for something behind him and felt a pain in his shoulder. Since that time, he has continued mild to moderate pain with use of his left arm. He denies swelling, redness. He denies previous injury to the shoulder.  See problem based charting for chronic medical problems.     Past Medical History  Diagnosis Date  . Asthma   . Chronic back pain   . Bilateral chronic knee pain   . Chronic shoulder pain   . Hypertension   . Reflux   . Depression   . Hx of echocardiogram     a. Echo 10/24/12:  EF 60-65%, normal wall thickness, normal diastolic fxn  . Hx of cardiovascular stress test     a. Lexiscan Myoview 10/29/12:  EF 56%, no ischemia  . Allergy   . Hyperlipidemia    Current Outpatient Prescriptions  Medication Sig Dispense Refill  . albuterol (PROVENTIL HFA;VENTOLIN HFA) 108 (90 BASE) MCG/ACT inhaler Inhale 2 puffs into the lungs every 6 (six) hours as needed for wheezing or shortness of breath.  1 Inhaler  4  . beclomethasone (QVAR) 80 MCG/ACT inhaler Inhale 2 puffs into the lungs 2 (two) times daily.  1 Inhaler  2  . lovastatin (MEVACOR) 20 MG tablet Take 1 tablet (20 mg total) by mouth daily.  30 tablet  11   No current facility-administered medications for this visit.   Family History  Problem Relation Age of Onset  . Hypertension Other   . Diabetes Other   . Heart attack Other   . Diabetes Mother   . Hypertension Mother   . Hyperlipidemia Neg Hx   . Sudden death Neg Hx   . Hypertension Sister   . Stroke Sister   . Hypertension Brother   . Asthma Daughter    History   Social History  . Marital Status: Divorced     Spouse Name: N/A    Number of Children: N/A  . Years of Education: N/A   Social History Main Topics  . Smoking status: Never Smoker   . Smokeless tobacco: Never Used  . Alcohol Use: Yes     Comment: 1 beer/night  . Drug Use: Yes    Special: Marijuana     Comment: daily MJ use for pain control.  . Sexual Activity: Yes    Birth Control/ Protection: Condom   Other Topics Concern  . None   Social History Narrative   [place] in [place].   Review of Systems: Review of Systems  Constitutional: Negative for fever, chills, weight loss, malaise/fatigue and diaphoresis.  HENT: Negative for sore throat.   Respiratory: Positive for cough and wheezing. Negative for shortness of breath.   Cardiovascular: Negative for chest pain, palpitations, orthopnea, leg swelling and PND.  Gastrointestinal: Negative for heartburn, nausea, vomiting, abdominal pain, diarrhea and constipation.  Musculoskeletal: Positive for back pain, joint pain, myalgias and neck pain. Negative for falls.  Neurological: Negative for weakness and headaches.    Objective:  Physical Exam: Filed Vitals:   12/28/13 1012  BP: 128/76  Pulse: 64  Temp: 97.9 F (36.6 C)  TempSrc: Oral  Height: 6\' 1"  (1.854 m)  Weight: 201 lb 3.2 oz (91.264 kg)  SpO2: 97%   Physical Exam  Constitutional: He is oriented to person, place, and time. He appears well-developed and well-nourished. No distress.  HENT:  Head: Normocephalic and atraumatic.  Nose: Nose normal.  Cardiovascular: Normal rate, regular rhythm, normal heart sounds and intact distal pulses.  Exam reveals no friction rub.   No murmur heard. Pulmonary/Chest: Effort normal and breath sounds normal. No respiratory distress. He has no wheezes. He has no rales.  Abdominal: Soft. Bowel sounds are normal. He exhibits no distension. There is no tenderness.  Musculoskeletal:       Left shoulder: He exhibits tenderness and bony tenderness. He exhibits normal  range of motion, no swelling and no effusion.       Arms: Patient is tender to palpation of the [facility] joint. He has trouble with pressing posteriorly with his left hand behind his back. He is able to elevate the arm.  Neurological: He is alert and oriented to person, place, and time.  Skin: He is not diaphoretic.  Psychiatric: He has a normal mood and affect. His behavior is normal.    Assessment & Plan:

## 2013-12-28 NOTE — Assessment & Plan Note (Signed)
Patients asthma is similar to last visit. He uses albuterol prn chest tightness. He uses it 3x daily. He wakes 2-3 weekly to use the inhaler when his asthma is bad.  I rec adding a steroid and LABA. Patient agrees but states he cannot afford. I gave him a sample of Advair and instructed the patient to contact [facility] to find out if he can get his meds at reduced cost. He agreed with plan. i provided him with a printout for [facility].

## 2013-12-28 NOTE — Patient Instructions (Addendum)
I am referring you to sports medicine for your shoulder pain. Please take Ibuprofen 400 mg three times daily for the next two weeks.  I have given you a sample of Advair. Please complete the [facility] paperwork so that you can take this medicine long term.  I am checking labs and will call you with the results.  Acromioclavicular Injuries The AC (acromioclavicular) joint is the joint in the shoulder where the collarbone (clavicle) meets the shoulder blade (scapula). The part of the shoulder blade connected to the collarbone is called the acromion. Common problems with and treatments for the [facility] joint are detailed below. ARTHRITIS Arthritis occurs when the joint has been injured and the smooth padding between the joints (cartilage) is lost. This is the wear and tear seen in most joints of the body if they have been overused. This causes the joint to produce pain and swelling which is worse with activity.  AC JOINT SEPARATION AC joint separation means that the ligaments connecting the acromion of the shoulder blade and collarbone have been damaged, and the two bones no longer line up. AC separations can be anywhere from mild to severe, and are "graded" depending upon which ligaments are torn and how badly they are torn.  Grade I Injury: the least damage is done, and the [facility] joint still lines up.  Grade II Injury: damage to the ligaments which reinforce the [facility] joint. In a Grade II injury, these ligaments are stretched but not entirely torn. When stressed, the [facility] joint becomes painful and unstable.  Grade III Injury: AC and secondary ligaments are completely torn, and the collarbone is no longer attached to the shoulder blade. This results in deformity; a prominence of the end of the clavicle. AC JOINT FRACTURE AC joint fracture means that there has been a break in the bones of the [facility] joint, usually the end of the clavicle. TREATMENT TREATMENT OF AC ARTHRITIS  There is currently no way to replace the  cartilage damaged by arthritis. The best way to improve the condition is to decrease the activities which aggravate the problem. Application of ice to the joint helps decrease pain and soreness (inflammation). The use of non-steroidal anti-inflammatory medication is helpful.  If less conservative measures do not work, then cortisone shots (injections) may be used. These are anti-inflammatories; they decrease the soreness in the joint and swelling.  If non-surgical measures fail, surgery may be recommended. The procedure is generally removal of a portion of the end of the clavicle. This is the part of the collarbone closest to your acromion which is stabilized with ligaments to the acromion of the shoulder blade. This surgery may be performed using a tube-like instrument with a light (arthroscope) for looking into a joint. It may also be performed as an open surgery through a small incision by the surgeon. Most patients will have good range of motion within 6 weeks and may return to all activity including sports by 8-12 weeks, barring complications. TREATMENT OF AN AC SEPARATION  The initial treatment is to decrease pain. This is best accomplished by immobilizing the arm in a sling and placing an ice pack to the shoulder for 20 to 30 minutes every 2 hours as needed. As the pain starts to subside, it is important to begin moving the fingers, wrist, elbow and eventually the shoulder in order to prevent a stiff or "frozen" shoulder. Instruction on when and how much to move the shoulder will be provided by your caregiver. The length of  time needed to regain full motion and function depends on the amount or grade of the injury. Recovery from a Grade I AC separation usually takes 10 to 14 days, whereas a Grade III may take 6 to 8 weeks.  Grade I and II separations usually do not require surgery. Even Grade III injuries usually allow return to full activity with few restrictions. Treatment is also based on the  activity demands of the injured shoulder. For example, a high level quarterback with an injured throwing arm will receive more aggressive treatment than someone with a desk job who rarely uses his/her arm for strenuous activities. In some cases, a painful lump may persist which could require a later surgery. Surgery can be very successful, but the benefits must be weighed against the potential risks. TREATMENT OF AN AC JOINT FRACTURE Fracture treatment depends on the type of fracture. Sometimes a splint or sling may be all that is required. Other times surgery may be required for repair. This is more frequently the case when the ligaments supporting the clavicle are completely torn. Your caregiver will help you with these decisions and together you can decide what will be the best treatment. HOME CARE INSTRUCTIONS   Apply ice to the injury for 15-20 minutes each hour while awake for 2 days. Put the ice in a plastic bag and place a towel between the bag of ice and skin.  If a sling has been applied, wear it constantly for as long as directed by your caregiver, even at night. The sling or splint can be removed for bathing or showering or as directed. Be sure to keep the shoulder in the same place as when the sling is on. Do not lift the arm.  If a figure-of-eight splint has been applied it should be tightened gently by another person every day. Tighten it enough to keep the shoulders held back. Allow enough room to place the index finger between the body and strap. Loosen the splint immediately if there is numbness or tingling in the hands.  Take over-the-counter or prescription medicines for pain, discomfort or fever as directed by your caregiver.  If you or your child has received a follow up appointment, it is very important to keep that appointment in order to avoid long term complications, chronic pain or disability. SEEK MEDICAL CARE IF:   The pain is not relieved with medications.  There is  increased swelling or discoloration that continues to get worse rather than better.  You or your child has been unable to follow up as instructed.  There is progressive numbness and tingling in the arm, forearm or hand. SEEK IMMEDIATE MEDICAL CARE IF:   The arm is numb, cold or pale.  There is increasing pain in the hand, forearm or fingers. MAKE SURE YOU:   Understand these instructions.  Will watch your condition.  Will get help right away if you are not doing well or get worse.  Document Released: 06/13/2005 Document Revised: 11/26/2011 Document Reviewed: 12/06/2008 [facility] Patient Information 2014 [place], [place].  Rotator Cuff Injury Rotator cuff injury is any type of injury to the set of muscles and tendons that make up the stabilizing unit of your shoulder. This unit holds the ball of your upper arm bone (humerus) in the socket of your shoulder blade (scapula).  CAUSES Injuries to your rotator cuff most commonly come from sports or activities that cause your arm to be moved repeatedly over your head. Examples of this include throwing, weight lifting,  swimming, or racquet sports. Long lasting (chronic) irritation of your rotator cuff can cause soreness and swelling (inflammation), bursitis, and eventual damage to your tendons, such as a tear (rupture). SIGNS AND SYMPTOMS Acute rotator cuff tear:  Sudden tearing sensation followed by severe pain shooting from your upper shoulder down your arm toward your elbow.  Decreased range of motion of your shoulder because of pain and muscle spasm.  Severe pain.  Inability to raise your arm out to the side because of pain and loss of muscle power (large tears). Chronic rotator cuff tear:  Pain that usually is worse at night and may interfere with sleep.  Gradual weakness and decreased shoulder motion as the pain worsens.  Decreased range of motion. Rotator cuff tendinitis:  Deep ache in your shoulder and the outside upper arm  over your shoulder.  Pain that comes on gradually and becomes worse when lifting your arm to the side or turning it inward. DIAGNOSIS Rotator cuff injury is diagnosed through a medical history, physical exam, and imaging exam. The medical history helps determine the type of rotator cuff injury. Your health care provider will look at your injured shoulder, feel the injured area, and ask you to move your shoulder in different positions. X-ray exams typically are done to rule out other causes of shoulder pain, such as fractures. MRI is the exam of choice for the most severe shoulder injuries because the images show muscles and tendons.  TREATMENT  Chronic tear:  Medicine for pain, such as acetaminophen or ibuprofen.  Physical therapy and range-of-motion exercises may be helpful in maintaining shoulder function and strength.  Steroid injections into your shoulder joint.  Surgical repair of the rotator cuff if the injury does not heal with noninvasive treatment. Acute tear:  Anti-inflammatory medicines such as ibuprofen and naproxen to help reduce pain and swelling.  A sling to help support your arm and rest your rotator cuff muscles. Long-term use of a sling is not advised. It may cause significant stiffening of the shoulder joint.  Surgery may be considered within a few weeks, especially in younger, active people, to return the shoulder to full function.  Indications for surgical treatment include the following:  Age younger than 60 years.  Rotator cuff tears that are complete.  Physical therapy, rest, and anti-inflammatory medicines have been used for 6 8 weeks, with no improvement.  Employment or sporting activity that requires constant shoulder use. Tendinitis:  Anti-inflammatory medicines such as ibuprofen and naproxen to help reduce pain and swelling.  A sling to help support your arm and rest your rotator cuff muscles. Long-term use of a sling is not advised. It may cause  significant stiffening of the shoulder joint.  Severe tendinitis may require:  Steroid injections into your shoulder joint.  Physical therapy.  Surgery. HOME CARE INSTRUCTIONS   Apply ice to your injury:  Put ice in a plastic bag.  Place a towel between your skin and the bag.  Leave the ice on for 20 minutes, 2 3 times a day.  If you have a shoulder immobilizer (sling and straps), wear it until told otherwise by your health care provider.  You may want to sleep on several pillows or in a recliner at night to lessen swelling and pain.  Only take over-the-counter or prescription medicines for pain, discomfort, or fever as directed by your health care provider.  Do simple hand squeezing exercises with a soft rubber ball to decrease hand swelling. SEEK MEDICAL CARE IF:  Your shoulder pain increases, or new pain or numbness develops in your arm, hand, or fingers.  Your hand or fingers are colder than your other hand. SEEK IMMEDIATE MEDICAL CARE IF:   Your arm, hand, or fingers are numb or tingling.  Your arm, hand, or fingers are increasingly swollen and painful, or they turn Kirchner or blue. MAKE SURE YOU:  Understand these instructions.  Will watch your condition.  Will get help right away if you are not doing well or get worse. Document Released: 08/31/2000 Document Revised: 06/24/2013 Document Reviewed: 04/15/2013 [facility] Patient Information 2014 [place].

## 2013-12-28 NOTE — Assessment & Plan Note (Signed)
Appears stable. Checking BMP today.

## 2013-12-29 ENCOUNTER — Other Ambulatory Visit: Payer: Self-pay | Admitting: Dietician

## 2013-12-29 ENCOUNTER — Telehealth: Payer: Self-pay | Admitting: Internal Medicine

## 2013-12-29 DIAGNOSIS — E119 Type 2 diabetes mellitus without complications: Secondary | ICD-10-CM

## 2013-12-29 DIAGNOSIS — N189 Chronic kidney disease, unspecified: Secondary | ICD-10-CM

## 2013-12-29 DIAGNOSIS — R739 Hyperglycemia, unspecified: Secondary | ICD-10-CM

## 2013-12-29 NOTE — Progress Notes (Signed)
Suggest referral (entered in this note for you to sign) and repeat A1C or fasting glucose for diabetes diagnosis confirmation.

## 2013-12-29 NOTE — Telephone Encounter (Signed)
I called the patient and informed hi of his lab results. I informed him that his A1C is elevated and indicates that he has DM II. I told him that with lifestyle modification that he can hopefully lower his A1C to the normal level. I rec meeting with Norm Parcelonna Plyler about health coaching to pursue this goal.  I also informed the patient of his elevated LDL. He wants to focus on DM for now and readdress this at future appt.

## 2013-12-30 NOTE — Progress Notes (Signed)
Case discussed with Dr. Komanski at the time of the visit.  We reviewed the resident's history and exam and pertinent patient test results.  I agree with the assessment, diagnosis, and plan of care documented in the resident's note.      

## 2014-01-04 ENCOUNTER — Encounter: Payer: No Typology Code available for payment source | Admitting: Dietician

## 2014-01-20 ENCOUNTER — Telehealth: Payer: Self-pay | Admitting: *Deleted

## 2014-01-20 ENCOUNTER — Other Ambulatory Visit: Payer: Self-pay | Admitting: Sports Medicine

## 2014-01-20 ENCOUNTER — Ambulatory Visit (INDEPENDENT_AMBULATORY_CARE_PROVIDER_SITE_OTHER): Payer: No Typology Code available for payment source | Admitting: Sports Medicine

## 2014-01-20 ENCOUNTER — Ambulatory Visit
Admission: RE | Admit: 2014-01-20 | Discharge: 2014-01-20 | Disposition: A | Discharge status: Home / Self Care | Payer: No Typology Code available for payment source | Source: Ambulatory Visit | Attending: Sports Medicine | Admitting: Sports Medicine

## 2014-01-20 ENCOUNTER — Ambulatory Visit (INDEPENDENT_AMBULATORY_CARE_PROVIDER_SITE_OTHER): Payer: No Typology Code available for payment source | Admitting: Internal Medicine

## 2014-01-20 ENCOUNTER — Encounter: Payer: Self-pay | Admitting: Internal Medicine

## 2014-01-20 VITALS — BP 143/84 | Ht 73.0 in | Wt 201.0 lb

## 2014-01-20 VITALS — BP 123/78 | HR 70 | Temp 97.4°F | Ht 73.0 in | Wt 201.2 lb

## 2014-01-20 DIAGNOSIS — R7309 Other abnormal glucose: Secondary | ICD-10-CM

## 2014-01-20 DIAGNOSIS — M25512 Pain in left shoulder: Secondary | ICD-10-CM

## 2014-01-20 DIAGNOSIS — R739 Hyperglycemia, unspecified: Secondary | ICD-10-CM

## 2014-01-20 DIAGNOSIS — M25519 Pain in unspecified shoulder: Secondary | ICD-10-CM

## 2014-01-20 DIAGNOSIS — J45909 Unspecified asthma, uncomplicated: Secondary | ICD-10-CM

## 2014-01-20 DIAGNOSIS — J45901 Unspecified asthma with (acute) exacerbation: Secondary | ICD-10-CM

## 2014-01-20 LAB — GLUCOSE, CAPILLARY: GLUCOSE-CAPILLARY: 196 mg/dL — AB (ref 70–99)

## 2014-01-20 MED ORDER — ALBUTEROL SULFATE (2.5 MG/3ML) 0.083% IN NEBU
2.5000 mg | INHALATION_SOLUTION | Freq: Once | RESPIRATORY_TRACT | Status: AC
  Administered 2014-01-20: 2.5 mg via RESPIRATORY_TRACT

## 2014-01-20 MED ORDER — PREDNISONE 20 MG PO TABS: ORAL_TABLET | ORAL | Status: DC

## 2014-01-20 MED ORDER — IPRATROPIUM BROMIDE 0.02 % IN SOLN
0.5000 mg | Freq: Once | RESPIRATORY_TRACT | Status: AC
  Administered 2014-01-20: 0.5 mg via RESPIRATORY_TRACT

## 2014-01-20 NOTE — Patient Instructions (Signed)
Take all the medications as advised. If your breathing gets worse or does not improve, please call us or seek medical help.

## 2014-01-20 NOTE — Assessment & Plan Note (Signed)
Clinical symptoms and signs consistent with mild exacerbation of bronchial asthma. Discussed with the attending regarding further plan.  Plans: Breathing treatment given in the clinic. Prednisone 40 mg x 5 days. Recommended to continue albuterol every 4-6 hours as needed. Recommended to seek medical help if SOB worsens.

## 2014-01-20 NOTE — Progress Notes (Signed)
Subjective:   [name]   DOB: 11/20/1965 48 y.o.   MRN: [id number]  HPI: Javier Beasley is a 48 y.o. gentleman with PMH significant for Asthma, HTN comes to the office as walk-in complaining of shortness of breath and asking for breathing treatment.  Patient reports that he is been feeling short of breath for the last 3 days and has been coughing with mucoid expectoration. He reports that he is allergic to pollen and had exposed to it recently when his symptoms started. He denies any fever, chills, body pains, runny nose, sore throat, headaches. He reports using albuterol inhaler 3-4 times daily with out any relief. He reports that he ran out of Qvar and Advair about a month ago and hasn't been taking them ever since.   Patient denies any other complaints during this office visit.    Past Medical History  Diagnosis Date  . Asthma   . Chronic back pain   . Bilateral chronic knee pain   . Chronic shoulder pain   . Hypertension   . Reflux   . Depression   . Hx of echocardiogram     a. Echo 10/24/12:  EF 60-65%, normal wall thickness, normal diastolic fxn  . Hx of cardiovascular stress test     a. Lexiscan Myoview 10/29/12:  EF 56%, no ischemia  . Allergy   . Hyperlipidemia    Current Outpatient Prescriptions  Medication Sig Dispense Refill  . albuterol (PROVENTIL HFA;VENTOLIN HFA) 108 (90 BASE) MCG/ACT inhaler Inhale 2 puffs into the lungs every 6 (six) hours as needed for wheezing or shortness of breath.  1 Inhaler  4  . beclomethasone (QVAR) 80 MCG/ACT inhaler Inhale 2 puffs into the lungs 2 (two) times daily.  1 Inhaler  2  . Fluticasone-Salmeterol (ADVAIR DISKUS) 100-50 MCG/DOSE AEPB Inhale 1 puff into the lungs 2 (two) times daily.  14 each  10  . ibuprofen (ADVIL) 200 MG tablet Take 2 tablets (400 mg total) by mouth 3 (three) times daily.  100 tablet  2  . lovastatin (MEVACOR) 20 MG tablet Take 1 tablet (20 mg total) by mouth daily.  30 tablet  11  .        No current facility-administered medications for this visit.   Family History  Problem Relation Age of Onset  . Hypertension Other   . Diabetes Other   . Heart attack Other   . Diabetes Mother   . Hypertension Mother   . Hyperlipidemia Neg Hx   . Sudden death Neg Hx   . Hypertension Sister   . Stroke Sister   . Hypertension Brother   . Asthma Daughter    History   Social History  . Marital Status: Divorced    Spouse Name: N/A    Number of Children: N/A  . Years of Education: N/A   Social History Main Topics  . Smoking status: Never Smoker   . Smokeless tobacco: Never Used  . Alcohol Use: Yes     Comment: 1 beer/night  . Drug Use: Yes    Special: Marijuana     Comment: daily MJ use for pain control.  . Sexual Activity: Yes    Birth Control/ Protection: Condom   Other Topics Concern  . None   Social History Narrative   [place].   Review of Systems: Pertinent items are noted in HPI. Objective:  Physical Exam: Filed Vitals:   01/20/14 0906  BP:  123/78  Pulse: 70  Temp: 97.4 F (36.3 C)  TempSrc: Oral  Height: 6\' 1"  (1.854 m)  Weight: 201 lb 3.2 oz (91.264 kg)  SpO2: 100%   Constitutional: Vital signs reviewed.   Patient is a well-developed and well-nourished and is in mild distress secondary to SOB and coughing. Patient is cooperative with exam. Alert and oriented x3.  Head: Normocephalic and atraumatic Nose: No erythema or drainage noted.  Mildly swollen inferior turbinates. Mouth: no erythema or exudates, MMM Cardiovascular: RRR, S1 normal, S2 normal. Pulmonary/Chest: Good air entry bilaterally. Deep inspirations and expirations producing coughing spells on exam. Bilateral mild wheezing noted diffusely.  Neurological: A&O x3, Non-focal.  Skin: Warm, dry and intact.  Psychiatric: Normal mood and affect.   Assessment & Plan:

## 2014-01-20 NOTE — Telephone Encounter (Signed)
Pt walked in to clinic and stated he wanted a breathing treatment. Past 2 days has had chest congestion and Abrams productive cough. Denies temp and head congestion. Has appt 10:30AM Sports Medicine. Appt made for 9:15AM with Dr Comer LocketBoggala. Stanton KidneyDebra Daylene Vandenbosch RN 01/20/14 9AM

## 2014-01-20 NOTE — Addendum Note (Signed)
Addended by: Angelina OkHERBIN, Iyania Denne F on: 01/20/2014 11:25 AM   Modules accepted: Orders

## 2014-01-20 NOTE — Progress Notes (Signed)
   Subjective:    Patient ID: Javier Beasley, male    DOB: 09/10/1966, 48 y.o.   MRN: [id number]  HPI chief complaint: Shoulder pain  Very pleasant 48 year old right-hand-dominant male comes in today complaining of 3 weeks of left shoulder pain. She acutely injured his shoulder when he was reaching into the back seat. Felt a pop and immediate pain. Since then he has had diffuse pain throughout the shoulder with occasional painful popping and catching. He has had great difficulty with reaching away from his body or around behind his back. Difficulty sleeping at night as well. No weakness. Denies any significant problems with this shoulder in the past. No prior shoulder surgeries. No numbness and tingling down the left arm. He has been taking over-the-counter Advil without much symptom relief.  Past medical history and current medications reviewed Medications reviewed Allergies reviewed    Review of Systems     Objective:   Physical Exam Well-developed, fit-appearing. No acute distress. Awake alert and oriented x3. Vital signs are reviewed.  Left shoulder: Patient demonstrates full active forward flexion but abduction only to 90 before pain limits any further motion. Internal and external rotation are full but painful. There is no tenderness to palpation along the clavicle nor over the a.c. joint. No tenderness at the bicipital groove. No Popeye deformity. Rotator cuff strength is 5/5 but reproducible of pain with some resisted internal rotation. Negative O'Brien but a positive clunk and a positive shift and load. Neurovascularly intact distally.       Assessment & Plan:  Left shoulder pain worrisome for labral tear versus possible rotator cuff tear  AP, lateral, and axillary x-rays of the left shoulder. If unremarkable, we will proceed with an MRI arthrogram to rule out both labral and rotator cuff pathology. Patient and his wife will followup with me in the office in the days following  that study to go over the results and delineate treatment. Of note, the patient was also complaining of some diffuse right arm numbness and tingling which began 2-3 days ago. No weakness. I did not examine his right upper extremity today but I will go ahead and get some x-rays of his cervical spine (AP and lateral views) and I will evaluate this further at his followup visit.

## 2014-01-20 NOTE — Assessment & Plan Note (Signed)
Newly diagnosed DM with A1C of 6.8 in April 2015. Patient preferred life style modifications and dietary changes. [facility]

## 2014-01-21 NOTE — Progress Notes (Signed)
Case discussed with Dr. Boggala at the time of the visit.  We reviewed the resident's history and exam and pertinent patient test results.  I agree with the assessment, diagnosis, and plan of care documented in the resident's note. 

## 2014-01-26 ENCOUNTER — Inpatient Hospital Stay: Admission: RE | Admit: 2014-01-26 | Payer: No Typology Code available for payment source | Source: Ambulatory Visit

## 2014-02-03 ENCOUNTER — Ambulatory Visit: Payer: No Typology Code available for payment source | Admitting: Sports Medicine

## 2014-02-04 ENCOUNTER — Ambulatory Visit
Admission: RE | Admit: 2014-02-04 | Discharge: 2014-02-04 | Disposition: A | Discharge status: Home / Self Care | Payer: No Typology Code available for payment source | Source: Ambulatory Visit | Attending: Sports Medicine | Admitting: Sports Medicine

## 2014-02-04 DIAGNOSIS — M25512 Pain in left shoulder: Secondary | ICD-10-CM

## 2014-02-04 MED ORDER — IOHEXOL 180 MG/ML  SOLN
15.0000 mL | Freq: Once | INTRAMUSCULAR | Status: AC | PRN
  Administered 2014-02-04: 15 mL via INTRA_ARTICULAR

## 2014-02-11 ENCOUNTER — Encounter: Payer: Self-pay | Admitting: Sports Medicine

## 2014-02-11 ENCOUNTER — Ambulatory Visit (INDEPENDENT_AMBULATORY_CARE_PROVIDER_SITE_OTHER): Payer: No Typology Code available for payment source | Admitting: Sports Medicine

## 2014-02-11 VITALS — BP 135/83 | Ht 73.0 in | Wt 202.0 lb

## 2014-02-11 DIAGNOSIS — M545 Low back pain, unspecified: Secondary | ICD-10-CM

## 2014-02-11 DIAGNOSIS — S46819A Strain of other muscles, fascia and tendons at shoulder and upper arm level, unspecified arm, initial encounter: Secondary | ICD-10-CM

## 2014-02-11 DIAGNOSIS — S43499A Other sprain of unspecified shoulder joint, initial encounter: Secondary | ICD-10-CM

## 2014-02-11 DIAGNOSIS — M542 Cervicalgia: Secondary | ICD-10-CM

## 2014-02-11 DIAGNOSIS — G8929 Other chronic pain: Secondary | ICD-10-CM

## 2014-02-11 DIAGNOSIS — S43439A Superior glenoid labrum lesion of unspecified shoulder, initial encounter: Secondary | ICD-10-CM

## 2014-02-11 NOTE — Progress Notes (Signed)
   Subjective:    Patient ID: Javier Beasley, male    DOB: May 11, 1966, 48 y.o.   MRN: [id number]  HPI Patient comes in today to discuss MRI arthrogram findings of the left shoulder. Arthrogram does in fact confirm a SLAP tear through the labrum. Patient continues to get intermittent mechanical symptoms along with pain in the left shoulder. He is also complaining of intermittent numbness and tingling down the right arm into the thumb and index fingers of the right hand. No weakness. He has a history of lumbar degenerative disc disease and his current symptoms in his arm are similar to what he's experienced in the past with his legs. He's not tried any real medication for this pain. Intermittent neck pain as well. X-rays of his cervical spine done a few weeks ago showed moderately advanced cervical degenerative disc disease at C5-C6 with disc space narrowing and osteophyte formation.    Review of Systems     Objective:   Physical Exam Well-developed, well-nourished. No acute distress. Awake alert oriented x3. Vital signs reviewed.  Left shoulder: Full range of motion. Positive O'Brien's. Positive clunk. Rotator cuff strength 5/5. Neurovascular intact distally.  Cervical spine shows full painless range of motion. No tenderness along cervical midline. No paraspinal musculature spasm. Neurological exam shows strength to be 5/5 both upper extremities. Reflexes are trace but equal at the biceps, triceps, and brachial radialis tendons bilaterally. No atrophy. Sensation is intact to light touch grossly. Good radial and ulnar pulses.  X-rays and MRI arthrogram are as above       Assessment & Plan:  Left shoulder pain secondary to SLAP tear Right upper extremity radiculopathy secondary to C5-C6 degenerative disc disease History of lumbar degenerative disc disease  Definitive treatment for his left shoulder is surgery. Unfortunately the patient has no insurance. I'll give him the number for financial  assistance at [facility] and hopefully he can arrange to have his surgery done there. For his cervical spine and his low back I have referred him to physical therapy. He has a history of esophagitis and chronic kidney disease so I think we should avoid oral NSAIDs. Patient will followup with me in 4 weeks. If right arm radiculopathy continues I would consider further diagnostic imaging in the form of an MRI of the cervical spine in anticipation of referring him for cervical ESIs.

## 2014-02-23 ENCOUNTER — Ambulatory Visit: Payer: No Typology Code available for payment source | Attending: Sports Medicine

## 2014-02-23 DIAGNOSIS — M2550 Pain in unspecified joint: Secondary | ICD-10-CM | POA: Insufficient documentation

## 2014-02-23 DIAGNOSIS — IMO0001 Reserved for inherently not codable concepts without codable children: Secondary | ICD-10-CM | POA: Insufficient documentation

## 2014-02-25 ENCOUNTER — Ambulatory Visit: Payer: No Typology Code available for payment source | Admitting: Rehabilitation

## 2014-03-08 ENCOUNTER — Encounter: Payer: Self-pay | Admitting: Rehabilitation

## 2014-03-09 ENCOUNTER — Ambulatory Visit: Payer: No Typology Code available for payment source | Admitting: Rehabilitation

## 2014-03-11 ENCOUNTER — Ambulatory Visit: Payer: No Typology Code available for payment source | Admitting: Rehabilitation

## 2014-03-15 ENCOUNTER — Ambulatory Visit: Payer: No Typology Code available for payment source | Admitting: Sports Medicine

## 2014-03-22 ENCOUNTER — Ambulatory Visit: Payer: No Typology Code available for payment source

## 2014-03-24 ENCOUNTER — Ambulatory Visit: Payer: No Typology Code available for payment source | Attending: Sports Medicine

## 2014-03-24 DIAGNOSIS — IMO0001 Reserved for inherently not codable concepts without codable children: Secondary | ICD-10-CM | POA: Insufficient documentation

## 2014-03-24 DIAGNOSIS — M2550 Pain in unspecified joint: Secondary | ICD-10-CM | POA: Insufficient documentation

## 2014-03-29 ENCOUNTER — Encounter: Payer: Self-pay | Admitting: Sports Medicine

## 2014-03-29 ENCOUNTER — Ambulatory Visit (INDEPENDENT_AMBULATORY_CARE_PROVIDER_SITE_OTHER): Payer: No Typology Code available for payment source | Admitting: Sports Medicine

## 2014-03-29 ENCOUNTER — Ambulatory Visit: Payer: No Typology Code available for payment source

## 2014-03-29 VITALS — BP 122/87 | HR 76 | Ht 73.0 in | Wt 205.0 lb

## 2014-03-29 DIAGNOSIS — M5010 Cervical disc disorder with radiculopathy, unspecified cervical region: Secondary | ICD-10-CM

## 2014-03-29 DIAGNOSIS — M5412 Radiculopathy, cervical region: Secondary | ICD-10-CM

## 2014-03-29 NOTE — Progress Notes (Signed)
   Subjective:    Patient ID: Javier Beasley, male    DOB: 12/11/1965, 48 y.o.   MRN: [id number]  HPI Patient comes in today for followup on cervical radiculopathy. He has attended several physical therapy sessions but has not noticed much improvement in his pain. He continues to get numbness and tingling down the right arm into the middle finger of his right hand. He does note that traction does temporarily help while he is in physical therapy. Previous x-rays show multilevel degenerative disc disease of the cervical spine.    Review of Systems     Objective:   Physical Exam Well-developed, well-nourished. No acute distress. Sitting comfortably in the exam room  Cervical spine: Good range of motion. No spasm. No tenderness to palpation along the cervical midline. Neurological exam does show some weakness in the right hand intrinsic muscles but the remainder of his strength is 5/5 in both upper extremities. No atrophy. Reflexes are equal at the biceps, triceps, and brachial radialis tendons. Sensation is intact to light-touch grossly. Good radial ulnar pulses.       Assessment & Plan:  Right upper extremity cervical radiculopathy  Patient has had a total of 6 physical therapy visits. Not much improvement in his symptoms has been noted. Therefore, I'm going to proceed with an MRI of the cervical spine in anticipation of referring him for cervical ESI. His "orange card" has expired so I will be unable to order the MRI at this time. I've asked the patient to contact my office once he has reestablished his "orange card".

## 2014-04-05 ENCOUNTER — Ambulatory Visit: Payer: No Typology Code available for payment source

## 2014-04-08 ENCOUNTER — Ambulatory Visit: Payer: No Typology Code available for payment source

## 2014-06-28 ENCOUNTER — Ambulatory Visit: Payer: No Typology Code available for payment source

## 2014-06-30 ENCOUNTER — Ambulatory Visit: Payer: Self-pay | Admitting: Sports Medicine

## 2014-07-05 ENCOUNTER — Ambulatory Visit (INDEPENDENT_AMBULATORY_CARE_PROVIDER_SITE_OTHER): Payer: Self-pay | Admitting: Internal Medicine

## 2014-07-05 ENCOUNTER — Encounter: Payer: Self-pay | Admitting: Internal Medicine

## 2014-07-05 VITALS — BP 149/79 | HR 55 | Temp 97.9°F | Ht 73.0 in | Wt 206.7 lb

## 2014-07-05 DIAGNOSIS — E119 Type 2 diabetes mellitus without complications: Secondary | ICD-10-CM

## 2014-07-05 DIAGNOSIS — E1122 Type 2 diabetes mellitus with diabetic chronic kidney disease: Secondary | ICD-10-CM | POA: Insufficient documentation

## 2014-07-05 DIAGNOSIS — Z23 Encounter for immunization: Secondary | ICD-10-CM

## 2014-07-05 DIAGNOSIS — Z0000 Encounter for general adult medical examination without abnormal findings: Secondary | ICD-10-CM | POA: Insufficient documentation

## 2014-07-05 DIAGNOSIS — N182 Chronic kidney disease, stage 2 (mild): Secondary | ICD-10-CM

## 2014-07-05 LAB — GLUCOSE, CAPILLARY: GLUCOSE-CAPILLARY: 107 mg/dL — AB (ref 70–99)

## 2014-07-05 LAB — POCT GLYCOSYLATED HEMOGLOBIN (HGB A1C): HEMOGLOBIN A1C: 6.6

## 2014-07-05 LAB — HM DIABETES EYE EXAM

## 2014-07-05 NOTE — Assessment & Plan Note (Addendum)
HbA1C 6.6 today.   -lifestyle modifications for now, counseled on diet and exercise -foot and eye exam done today -urine microalbumin

## 2014-07-05 NOTE — Progress Notes (Signed)
Subjective:   [name]   DOB: 02/20/1966 48 y.o.   MRN: [id number]  HPI: Javier Beasley is a 48 y.o. male with chronic knee and shoulder pain presenting to opc today for diabetes visit.  His last A1C was 6.8 in April 2015 but he was not aware it was that high. Today on recheck it is down to 6.6.  He endorses significant family history of diabetes and hypertension with almost all his family members.  He would like to try to avoid medications as much as possible and so we discussed lifestyle modifications including diet changes, less starches and sugary foods, increase exercise as tolerated as well.   Past Medical History  Diagnosis Date  . Asthma   . Chronic back pain   . Bilateral chronic knee pain   . Chronic shoulder pain   . Hypertension   . Reflux   . Depression   . Hx of echocardiogram     a. Echo 10/24/12:  EF 60-65%, normal wall thickness, normal diastolic fxn  . Hx of cardiovascular stress test     a. Lexiscan Myoview 10/29/12:  EF 56%, no ischemia  . Allergy   . Hyperlipidemia    Current Outpatient Prescriptions  Medication Sig Dispense Refill  . albuterol (PROVENTIL HFA;VENTOLIN HFA) 108 (90 BASE) MCG/ACT inhaler Inhale 2 puffs into the lungs every 6 (six) hours as needed for wheezing or shortness of breath.  1 Inhaler  4  . Fluticasone-Salmeterol (ADVAIR DISKUS) 100-50 MCG/DOSE AEPB Inhale 1 puff into the lungs 2 (two) times daily.  14 each  10  . ibuprofen (ADVIL) 200 MG tablet Take 2 tablets (400 mg total) by mouth 3 (three) times daily.  100 tablet  2  . lovastatin (MEVACOR) 20 MG tablet Take 1 tablet (20 mg total) by mouth daily.  30 tablet  11  . beclomethasone (QVAR) 80 MCG/ACT inhaler Inhale 2 puffs into the lungs 2 (two) times daily.  1 Inhaler  2   No current facility-administered medications for this visit.   Family History  Problem Relation Age of Onset  . Hypertension Other   . Diabetes Other   . Heart attack Other   . Diabetes  Mother   . Hypertension Mother   . Hyperlipidemia Neg Hx   . Sudden death Neg Hx   . Hypertension Sister   . Stroke Sister   . Hypertension Brother   . Asthma Daughter    History   Social History  . Marital Status: Divorced    Spouse Name: N/A    Number of Children: N/A  . Years of Education: N/A   Social History Main Topics  . Smoking status: Never Smoker   . Smokeless tobacco: Never Used  . Alcohol Use: Yes     Comment: 1 beer/night  . Drug Use: Yes    Special: Marijuana     Comment: daily MJ use for pain control.  . Sexual Activity: Yes    Birth Control/ Protection: Condom   Other Topics Concern  . None   Social History Narrative   [place]   Review of Systems:  Constitutional:  Denies fever, chills. Gets thirsty at times  HEENT:  Denies congestion  Respiratory:  Denies SOB  Cardiovascular:  Denies chest pain   Gastrointestinal:  Denies nausea, vomiting, abdominal pain  Genitourinary:  Denies dysuria.   Musculoskeletal:  Cramping in extremities, chronic shoulder pain  Skin:  Denies pallor, rash  and wound.   Neurological:  Denies syncope   Objective:  Physical Exam: Filed Vitals:   07/05/14 1036  BP: 149/79  Pulse: 55  Temp: 97.9 F (36.6 C)  TempSrc: Oral  Height: 6\' 1"  (1.854 m)  Weight: 206 lb 11.2 oz (93.759 kg)  SpO2: 100%   Vitals reviewed. General: sitting in chair, NAD HEENT: PERRL, EOMI Cardiac: RRR Pulm: clear to auscultation bilaterally Abd: soft, nontender, BS present Ext: warm and well perfused, no pedal edema, +2DP B/L, moving all 4 extremities Neuro: alert and oriented X3, strength and sensation to light touch equal in bilateral upper and lower extremities  Assessment & Plan:  Discussed with Dr. Josem KaufmannKlima

## 2014-07-05 NOTE — Patient Instructions (Addendum)
General Instructions: Please cut back on your starches and sugar intake  Cut back on salt intake as well  Increase your walking  Follow up with sports medicine this week as planned  Follow up with Dr. Isabella BowensKrall next week  Please bring your medicines with you each time you come to clinic.  Medicines may include prescription medications, over-the-counter medications, herbal remedies, eye drops, vitamins, or other pills.   Progress Toward Treatment Goals:  No flowsheet data found.  Self Care Goals & Plans:  Self Care Goal 01/20/2014  Manage my medications take my medicines as prescribed; bring my medications to every visit; refill my medications on time  Eat healthy foods drink diet soda or water instead of juice or soda; eat more vegetables; eat foods that are low in salt; eat baked foods instead of fried foods    No flowsheet data found.   Care Management & Community Referrals:  No flowsheet data found.

## 2014-07-05 NOTE — Assessment & Plan Note (Signed)
Flu vaccine today Foot exam Eye exam Urine microalbumin Consider pneumococcal vaccine next visit

## 2014-07-06 ENCOUNTER — Encounter: Payer: Self-pay | Admitting: *Deleted

## 2014-07-06 LAB — MICROALBUMIN / CREATININE URINE RATIO
Creatinine, Urine: 163.2 mg/dL
MICROALB UR: 1.9 mg/dL (ref <2.0)
Microalb Creat Ratio: 11.6 mg/g (ref 0.0–30.0)

## 2014-07-06 NOTE — Progress Notes (Signed)
Case discussed with Dr. Qureshi soon after the resident saw the patient.  We reviewed the resident's history and exam and pertinent patient test results.  I agree with the assessment, diagnosis, and plan of care documented in the resident's note. 

## 2014-07-19 ENCOUNTER — Ambulatory Visit (INDEPENDENT_AMBULATORY_CARE_PROVIDER_SITE_OTHER): Payer: No Typology Code available for payment source | Admitting: Sports Medicine

## 2014-07-19 ENCOUNTER — Encounter: Payer: Self-pay | Admitting: Sports Medicine

## 2014-07-19 VITALS — BP 130/89 | HR 72 | Ht 73.0 in | Wt 206.0 lb

## 2014-07-19 DIAGNOSIS — M5010 Cervical disc disorder with radiculopathy, unspecified cervical region: Secondary | ICD-10-CM

## 2014-07-19 NOTE — Progress Notes (Signed)
   Subjective:    Patient ID: Javier Beasley, male    DOB: 04/06/1966, 48 y.o.   MRN: [id number]  HPI: Patient comes in today for followup on cervical radiculopathy. He continues to have slowly worsening soreness in his neck with pain radiating down into his shoulders, with occasional numbness / weakness in his right hand (thumb, index, and middle fingers). He has gone to physical therapy sessions in the past, which did not help much. He did have some improvement in symptoms transiently with traction at PT. Tylenol Arthritis has not helped his symptoms much. Previous films showed multilevel degenerative disc disease of the cervical spine and pt reports he also has several bulging disks lower in his back. He reports that he has tenderness in his neck and that he can feel a "crackling, egg-shell sensation" in his neck when he massages it.  Of note, plan at last visit was to obtain MRI, but pt has the orange card and it was expired at that time. Pt now has gotten his orange card renewed. Also of note, he recently was diagnosed with DM type 2 (A1c was 6.6) and he has been prescribed a statin medication, but he has not yet picked it up.  Review of Systems: As above. He describes chronic low back pain and some bilateral shoulder pain, as well, but these are "no different than they have been."     Objective:   Physical Exam BP 130/89 mmHg  Pulse 72  Ht 6\' 1"  (1.854 m)  Wt 206 lb (93.441 kg)  BMI 27.18 kg/m2 Gen: well-appearing adult male in NAD MSK: neck and shoulders normal to inspection bilaterally  Mild point tenderness midline in C-spine around C6-C8 levels without paraspinal tenderness  No specific tenderness in musculature or bony prominences of arms bilaterally   Neck ROM full but with increased pain with far rotation and lateral bending bilaterally  Positive Spurling test for pain bilaterally, right worse than left, with some radicular symptoms on the right as well Neurovascular: alert,  oriented, no gross focal deficits noted with the exception of the exam below  4+/5 strength in right hand grip and resisted extension / flexion at elbow on the right  Strength 5/5 in left UE otherwise  Normal PAD / DAB strength in bilateral hands as well as thumb / 5th digit opposition  Normal capillary refill in hands / fingers bilaterally      Assessment & Plan:  48yo with likely disc-related cervical radiculopathy, especially in C6 distribution clinically - pt traveling in the next several weeks, so will order for MRI and contact pt at [phone] with results - depending on MRI, will need to consider referral to IR for diagnostic / therapeutic epidural injections - may benefit from further PT especially if injections are indicated - regardless, recommended compliance with diet / lifestyle modifications for recent diagnosis of DM - encouraged regular f/u with PCP - f/u with sports medicine depending on MRI and further eval / therapies  Javier Mortonhristopher M Jazzalynn Rhudy, MD PGY-3, [facility] 07/19/2014, 12:16 PM

## 2014-07-21 ENCOUNTER — Encounter: Payer: Self-pay | Admitting: Sports Medicine

## 2014-08-23 ENCOUNTER — Ambulatory Visit
Admission: RE | Admit: 2014-08-23 | Discharge: 2014-08-23 | Disposition: A | Discharge status: Home / Self Care | Payer: No Typology Code available for payment source | Source: Ambulatory Visit | Attending: Sports Medicine | Admitting: Sports Medicine

## 2014-08-23 DIAGNOSIS — M5010 Cervical disc disorder with radiculopathy, unspecified cervical region: Secondary | ICD-10-CM

## 2014-08-30 ENCOUNTER — Other Ambulatory Visit: Payer: Self-pay | Admitting: *Deleted

## 2014-08-30 ENCOUNTER — Telehealth: Payer: Self-pay | Admitting: Sports Medicine

## 2014-08-30 DIAGNOSIS — M5010 Cervical disc disorder with radiculopathy, unspecified cervical region: Secondary | ICD-10-CM

## 2014-08-30 NOTE — Telephone Encounter (Signed)
-----   Message from Linward Headlandhea N Straughn, RN sent at 08/30/2014  1:35 PM EST ----- Regarding: FW: mri results Contact: [phone]: [phone] Let me know what I need to tell the patient   ----- Message -----    From: Lizbeth BarkMelanie L Ceresi    Sent: 08/30/2014   1:24 PM      To: Linward Headlandhea N Straughn, RN Subject: mri results                                    Pt called looking for Neck and shoulder mri results

## 2014-08-30 NOTE — Telephone Encounter (Signed)
I spoke with the patient on the phone today after reviewing the MRI of his cervical spine. He has a broad-based posterior disc osteophyte complex at C5-C6 resulting in moderate bilateral neural foraminal stenosis. He also has uncovertebral hypertrophy at C6-C7 which results in moderate bilateral foraminal stenosis at this level. Patient is experiencing neuropathic symptoms into both arms. My recommendation is to try a cervical ESI. Patient will follow-up with me one week after the injection for check on his progress. Of note, I have received correspondence from his Social Security disability lawyer asking about possibility of light work or sedentary work. My recommendation is that for the patient to undergo a formal FCE to have this thoroughly and accurately documented. I will discuss this with the patient further at his next follow-up visit.

## 2014-09-07 ENCOUNTER — Other Ambulatory Visit: Payer: Self-pay | Admitting: Sports Medicine

## 2014-09-07 DIAGNOSIS — M5412 Radiculopathy, cervical region: Secondary | ICD-10-CM

## 2014-09-09 ENCOUNTER — Encounter: Payer: Self-pay | Admitting: Pulmonary Disease

## 2014-09-13 ENCOUNTER — Other Ambulatory Visit: Payer: Self-pay | Admitting: *Deleted

## 2014-09-14 ENCOUNTER — Ambulatory Visit
Admission: RE | Admit: 2014-09-14 | Discharge: 2014-09-14 | Disposition: A | Discharge status: Home / Self Care | Payer: No Typology Code available for payment source | Source: Ambulatory Visit | Attending: Sports Medicine | Admitting: Sports Medicine

## 2014-09-14 DIAGNOSIS — M545 Low back pain: Principal | ICD-10-CM

## 2014-09-14 DIAGNOSIS — G8929 Other chronic pain: Secondary | ICD-10-CM

## 2014-09-14 DIAGNOSIS — M5412 Radiculopathy, cervical region: Secondary | ICD-10-CM

## 2014-09-14 MED ORDER — IOHEXOL 300 MG/ML  SOLN
1.0000 mL | Freq: Once | INTRAMUSCULAR | Status: AC | PRN
  Administered 2014-09-14: 1 mL via EPIDURAL

## 2014-09-14 MED ORDER — TRIAMCINOLONE ACETONIDE 40 MG/ML IJ SUSP (RADIOLOGY)
60.0000 mg | Freq: Once | INTRAMUSCULAR | Status: AC
  Administered 2014-09-14: 60 mg via EPIDURAL

## 2014-09-14 NOTE — Discharge Instructions (Signed)

## 2014-09-21 ENCOUNTER — Encounter: Payer: Self-pay | Admitting: Internal Medicine

## 2014-09-21 ENCOUNTER — Encounter: Payer: Self-pay | Admitting: Sports Medicine

## 2014-09-21 ENCOUNTER — Ambulatory Visit (INDEPENDENT_AMBULATORY_CARE_PROVIDER_SITE_OTHER): Payer: Self-pay | Admitting: Internal Medicine

## 2014-09-21 ENCOUNTER — Ambulatory Visit (INDEPENDENT_AMBULATORY_CARE_PROVIDER_SITE_OTHER): Payer: No Typology Code available for payment source | Admitting: Sports Medicine

## 2014-09-21 VITALS — BP 154/92 | Ht 73.0 in | Wt 209.0 lb

## 2014-09-21 VITALS — BP 159/92 | HR 76 | Temp 98.2°F | Wt 206.3 lb

## 2014-09-21 DIAGNOSIS — E119 Type 2 diabetes mellitus without complications: Secondary | ICD-10-CM

## 2014-09-21 DIAGNOSIS — M25562 Pain in left knee: Secondary | ICD-10-CM

## 2014-09-21 DIAGNOSIS — M545 Low back pain, unspecified: Secondary | ICD-10-CM

## 2014-09-21 DIAGNOSIS — E1322 Other specified diabetes mellitus with diabetic chronic kidney disease: Secondary | ICD-10-CM

## 2014-09-21 DIAGNOSIS — N182 Chronic kidney disease, stage 2 (mild): Secondary | ICD-10-CM

## 2014-09-21 DIAGNOSIS — J4540 Moderate persistent asthma, uncomplicated: Secondary | ICD-10-CM

## 2014-09-21 DIAGNOSIS — M25561 Pain in right knee: Secondary | ICD-10-CM

## 2014-09-21 DIAGNOSIS — IMO0002 Reserved for concepts with insufficient information to code with codable children: Secondary | ICD-10-CM

## 2014-09-21 DIAGNOSIS — I10 Essential (primary) hypertension: Secondary | ICD-10-CM | POA: Insufficient documentation

## 2014-09-21 DIAGNOSIS — E663 Overweight: Secondary | ICD-10-CM

## 2014-09-21 DIAGNOSIS — E1122 Type 2 diabetes mellitus with diabetic chronic kidney disease: Secondary | ICD-10-CM

## 2014-09-21 DIAGNOSIS — G8929 Other chronic pain: Secondary | ICD-10-CM

## 2014-09-21 DIAGNOSIS — J45909 Unspecified asthma, uncomplicated: Secondary | ICD-10-CM

## 2014-09-21 DIAGNOSIS — E1165 Type 2 diabetes mellitus with hyperglycemia: Secondary | ICD-10-CM

## 2014-09-21 DIAGNOSIS — M5010 Cervical disc disorder with radiculopathy, unspecified cervical region: Secondary | ICD-10-CM

## 2014-09-21 DIAGNOSIS — E1365 Other specified diabetes mellitus with hyperglycemia: Principal | ICD-10-CM

## 2014-09-21 DIAGNOSIS — I129 Hypertensive chronic kidney disease with stage 1 through stage 4 chronic kidney disease, or unspecified chronic kidney disease: Secondary | ICD-10-CM

## 2014-09-21 LAB — POCT GLYCOSYLATED HEMOGLOBIN (HGB A1C): Hemoglobin A1C: 7.2

## 2014-09-21 LAB — GLUCOSE, CAPILLARY: GLUCOSE-CAPILLARY: 125 mg/dL — AB (ref 70–99)

## 2014-09-21 MED ORDER — LISINOPRIL 5 MG PO TABS: 5.0000 mg | ORAL_TABLET | Freq: Every day | ORAL | Status: DC

## 2014-09-21 MED ORDER — CYCLOBENZAPRINE HCL 10 MG PO TABS: 10.0000 mg | ORAL_TABLET | Freq: Three times a day (TID) | ORAL | Status: DC | PRN

## 2014-09-21 MED ORDER — METFORMIN HCL ER 500 MG PO TB24: 500.0000 mg | ORAL_TABLET | Freq: Every day | ORAL | Status: DC

## 2014-09-21 MED ORDER — ACETAMINOPHEN 500 MG PO TABS: 500.0000 mg | ORAL_TABLET | Freq: Four times a day (QID) | ORAL | Status: DC | PRN

## 2014-09-21 NOTE — Assessment & Plan Note (Signed)
Patient meets criteria for Stage I hypertension.  I discussed possible treatment with starting HCTZ or Lisinopril  (due to DM however patietn does not have proteinuria).  He is fearful of hypokalemia (muscle cramps) and wants to try lisinopril. Will start him on Lisinopril 5mg  daily. - Recheck BMP in 1 month (order placed)

## 2014-09-21 NOTE — Assessment & Plan Note (Signed)
-  Patient continues to have some bilateral knee pain. -Reports tylenol has been effective.

## 2014-09-21 NOTE — Progress Notes (Signed)
[facility] Subjective:   [name]   DOB: 04/03/1966 49 y.o.   MRN: [id number]  HPI: Mr.Javier Rueben BashM Beasley is a 49 y.o. male with a PMH below who presents for his chronic back and neck pain.  He reports he continues to have low back pain and neck pain, he did see his sports medicine physician dr. Margaretha Sheffieldraper earlier today,  Dr. Margaretha Sheffieldraper has been giving him some corticosteroid injects which he has gotten some relief. He does also reports that he has been getting relief from tylenol and occasional use of flexeril.  He would like a refill of flexeril.  DM2: He has so far been diet controlled but did receive the recent cortiosteroid injections.  He has a very strong family history (also has a BMI of 26-27).  Elevated blood pressure: he has had elevated blood pressure on 3 of his last 4 visits.  Past Medical History  Diagnosis Date  . Asthma   . Chronic back pain   . Bilateral chronic knee pain   . Chronic shoulder pain   . Hypertension   . Reflux   . Depression   . Hx of echocardiogram     a. Echo 10/24/12:  EF 60-65%, normal wall thickness, normal diastolic fxn  . Hx of cardiovascular stress test     a. Lexiscan Myoview 10/29/12:  EF 56%, no ischemia  . Allergy   . Hyperlipidemia    Current Outpatient Prescriptions  Medication Sig Dispense Refill  . albuterol (PROVENTIL HFA;VENTOLIN HFA) 108 (90 BASE) MCG/ACT inhaler Inhale 2 puffs into the lungs every 6 (six) hours as needed for wheezing or shortness of breath. 1 Inhaler 4  . beclomethasone (QVAR) 80 MCG/ACT inhaler Inhale 2 puffs into the lungs 2 (two) times daily. 1 Inhaler 2  . Fluticasone-Salmeterol (ADVAIR DISKUS) 100-50 MCG/DOSE AEPB Inhale 1 puff into the lungs 2 (two) times daily. 14 each 10  . ibuprofen (ADVIL) 200 MG tablet Take 2 tablets (400 mg total) by mouth 3 (three) times daily. 100 tablet 2  . lovastatin (MEVACOR) 20 MG tablet Take 1 tablet (20 mg total) by mouth daily. 30 tablet 11    No current facility-administered medications for this visit.   Family History  Problem Relation Age of Onset  . Hypertension Other   . Diabetes Other   . Heart attack Other   . Diabetes Mother   . Hypertension Mother   . Hyperlipidemia Neg Hx   . Sudden death Neg Hx   . Hypertension Sister   . Stroke Sister   . Hypertension Brother   . Asthma Daughter    History   Social History  . Marital Status: Divorced    Spouse Name: N/A    Number of Children: N/A  . Years of Education: N/A   Social History Main Topics  . Smoking status: Never Smoker   . Smokeless tobacco: Never Used  . Alcohol Use: Yes     Comment: 1 beer/night  . Drug Use: Yes    Special: Marijuana     Comment: daily MJ use for pain control.  . Sexual Activity: Yes    Birth Control/ Protection: Condom   Other Topics Concern  . None   Social History Narrative   [place]   Review of Systems: Review of Systems  Constitutional: Negative for fever, chills, weight loss and malaise/fatigue.  Respiratory: Negative for cough.   Cardiovascular: Negative for chest pain and  leg swelling.  Gastrointestinal: Negative for heartburn, diarrhea and constipation.  Genitourinary: Negative for dysuria.  Musculoskeletal: Positive for back pain and neck pain. Negative for falls.  Neurological: Negative for weakness.    Objective:  Physical Exam: Filed Vitals:   09/21/14 1431  BP: 159/92  Pulse: 76  Temp: 98.2 F (36.8 C)  TempSrc: Oral  Weight: 206 lb 4.8 oz (93.577 kg)  SpO2: 100%  Physical Exam  Constitutional: He appears well-developed and well-nourished. No distress.  Cardiovascular: Normal rate, regular rhythm and normal heart sounds.   Pulmonary/Chest: Effort normal and breath sounds normal.  Musculoskeletal:  Normal pROM of cervical spine, bilateral paraspinal tenderness. Bilateral paraspinal tenderness of lumbar region, straight leg raise negative.  No spinous process  tenderness. Bilateral knees have mild tenderness of tibial plateau and joint line, no effusion appreciated.  No ligamentous laxity appreciated.  Nursing note and vitals reviewed.   Assessment & Plan:  Case discussed with Dr. Josem Kaufmann  Asthma Using the inhaler more frequently. Given sample inhaler today Lot #  [id number] Exp. Date Mar 2017 Patient has been instructed regarding the correct time, dose, and frequency of taking this medication, including its desired effects and most common side effects.   Essential hypertension Patient meets criteria for Stage I hypertension.  I discussed possible treatment with starting HCTZ or Lisinopril  (due to DM however patietn does not have proteinuria).  He is fearful of hypokalemia (muscle cramps) and wants to try lisinopril. Will start him on Lisinopril  daily. 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) Lab Results  Component Value Date   HGBA1C 7.2 09/21/2014   HGBA1C 6.6 07/05/2014   HGBA1C 6.8* 12/28/2013     Assessment: Diabetes control: fair control Progress toward A1C goal:  deteriorated Comments: had recent cortiosteroid injections  Plan: Medications:  Will start MetforminER   daily Home glucose monitoring: Frequency: no home glucose monitoring Timing:   Instruction/counseling given: discussed the need for weight loss and discussed diet Educational resources provided:   Self management tools provided:   Other plans: Patient does have a near normal BMI, however was diagnosed with DM in his late 54s and is overweight.  I do believe this is consistent with T2DM especially in the absence of ever having DKA.  He is willing to try medication and given his age and lack of many other health problems it seems reasonable to shoot for a target A1c of <6.5.  Will start low dose ER metformin, this may also help him lose weight and can always be discontinued in the future.   Probably the bigger goal at this time will be BP control.     Chronic low back pain - Continues to have low back pain and has ongoing follow up with Sports medicine. - Will give refill for flexeril, advised this is only short term medications to be taken for less than 3 weeks.  Bilateral knee pain -Patient continues to have some bilateral knee pain. -Reports tylenol has been effective.    Medications Ordered Meds ordered this encounter  Medications  . metFORMIN (GLUCOPHAGE XR) 500 MG 24 hr tablet    Sig: Take 1 tablet (500 mg total) by mouth daily with breakfast.    Dispense:  90 tablet    Refill:  3  . cyclobenzaprine (FLEXERIL) 10 MG tablet    Sig: Take 1 tablet (10 mg total) by mouth 3 (three) times daily as needed for  muscle spasms.    Dispense:  60 tablet    Refill:  1  . acetaminophen (TYLENOL) 500 MG tablet    Sig: Take 1 tablet (500 mg total) by mouth every 6 (six) hours as needed.    Dispense:  30 tablet    Refill:  0  . lisinopril (PRINIVIL,ZESTRIL) 5 MG tablet    Sig: Take 1 tablet (5 mg total) by mouth daily.    Dispense:  90 tablet    Refill:  3   Other Orders Orders Placed This Encounter  Procedures  . Glucose, capillary  . BMP with Estimated GFR ([id number])    Standing Status: Future     Number of Occurrences:      Standing Expiration Date: 11/20/2014  . POCT HgB A1C

## 2014-09-21 NOTE — Assessment & Plan Note (Signed)
-   Continues to have low back pain and has ongoing follow up with Sports medicine. - Will give refill for flexeril, advised this is only short term medications to be taken for less than 3 weeks.

## 2014-09-21 NOTE — Assessment & Plan Note (Signed)
Using the inhaler more frequently. Given sample inhaler today Lot #  [id number] Exp. Date Mar 2017 Patient has been instructed regarding the correct time, dose, and frequency of taking this medication, including its desired effects and most common side effects.

## 2014-09-21 NOTE — Progress Notes (Signed)
Patient ID: Javier Beasley, male   DOB: 01/23/1966, 49 y.o.   MRN: [id number]  Patient comes in today for follow-up on cervical radiculopathy. He is status post cervical ESI at the C7-T1 level on December 29. He did note some symptom improvement with this but denies complete symptom resolution. Physical exam was not repeated today. We simply discussed his ongoing neck and low back issues. I've contacted the patient's attorney to let her know that I think that it is necessary to proceed with an FCE before determining any level of disability for this patient. I've asked the patient to contact his attorney directly to discuss this further. Otherwise, patient understands that we can repeat the cervical ESI if his symptoms warrant. Follow-up when necessary.

## 2014-09-21 NOTE — Assessment & Plan Note (Signed)
Lab Results  Component Value Date   HGBA1C 7.2 09/21/2014   HGBA1C 6.6 07/05/2014   HGBA1C 6.8* 12/28/2013     Assessment: Diabetes control: fair control Progress toward A1C goal:  deteriorated Comments: had recent cortiosteroid injections  Plan: Medications:  Will start MetforminER  500mg  daily Home glucose monitoring: Frequency: no home glucose monitoring Timing:   Instruction/counseling given: discussed the need for weight loss and discussed diet Educational resources provided:   Self management tools provided:   Other plans: Patient does have a near normal BMI, however was diagnosed with DM in his late 4640s and is overweight.  I do believe this is consistent with T2DM especially in the absence of ever having DKA.  He is willing to try medication and given his age and lack of many other health problems it seems reasonable to shoot for a target A1c of <6.5.  Will start low dose ER metformin, this may also help him lose weight and can always be discontinued in the future.  Probably the bigger goal at this time will be BP control.

## 2014-09-21 NOTE — Patient Instructions (Addendum)
General Instructions:   Thank you for bringing your medicines today. This helps [place] keep you safe from mistakes.   Progress Toward Treatment Goals:  Treatment Goal 09/21/2014  Hemoglobin A1C deteriorated    Self Care Goals & Plans:  Self Care Goal 09/21/2014  Manage my medications take my medicines as prescribed; bring my medications to every visit; refill my medications on time  Eat healthy foods drink diet soda or water instead of juice or soda; eat more vegetables; eat foods that are low in salt; eat baked foods instead of fried foods    Home Blood Glucose Monitoring 09/21/2014  Check my blood sugar no home glucose monitoring     Care Management & Community Referrals:  Referral 09/21/2014  Referrals made for care management support none needed       Metformin extended-release tablets What is this medicine? METFORMIN (met FOR min) is used to treat type 2 diabetes. It helps to control blood sugar. Treatment is combined with diet and exercise. This medicine can be used alone or with other medicines for diabetes. This medicine may be used for other purposes; ask your health care provider or pharmacist if you have questions. COMMON BRAND NAME(S): Fortamet, Glucophage XR, Glumetza What should I tell my health care provider before I take this medicine? They need to know if you have any of these conditions: -anemia -frequently drink alcohol-containing beverages -become easily dehydrated -heart attack -heart failure -kidney disease -liver disease -polycystic ovary syndrome -serious infection or injury -vomiting -an unusual or allergic reaction to metformin, other medicines, foods, dyes, or preservatives -pregnant or trying to get pregnant -breast-feeding How should I use this medicine? Take this medicine by mouth with a glass of water. Take it with meals. Swallow whole, do not crush or chew. Follow the directions on the prescription label. Take your medicine at regular intervals.  Do not take your medicine more often than directed. Talk to your pediatrician regarding the use of this medicine in children. Special care may be needed. Overdosage: If you think you have taken too much of this medicine contact a poison control center or emergency room at once. NOTE: This medicine is only for you. Do not share this medicine with others. What if I miss a dose? If you miss a dose, take it as soon as you can. If it is almost time for your next dose, take only that dose. Do not take double or extra doses. What may interact with this medicine? Do not take this medicine with any of the following medications: -dofetilide -gatifloxacin -certain contrast medicines given before X-rays, CT scans, MRI, or other procedures This medicine may also interact with the following medications: -digoxin -diuretics -male hormones, like estrogens or progestins and birth control pills -isoniazid -medicines for blood pressure, heart disease, irregular heart beat -morphine -nicotinic acid -phenothiazines like chlorpromazine, mesoridazine, prochlorperazine, thioridazine -phenytoin -procainamide -quinidine -quinine -ranitidine -steroid medicines like prednisone or cortisone -stimulant medicines for attention disorders, weight loss, or to stay awake -thyroid medicines -trimethoprim -vancomycin This list may not describe all possible interactions. Give your health care provider a list of all the medicines, herbs, non-prescription drugs, or dietary supplements you use. Also tell them if you smoke, drink alcohol, or use illegal drugs. Some items may interact with your medicine. What should I watch for while using this medicine? Visit your doctor or health care professional for regular checks on your progress. A test called the HbA1C (A1C) will be monitored. This is a simple blood test. It  measures your blood sugar control over the last 2 to 3 months. You will receive this test every 3 to 6  months. Learn how to check your blood sugar. Learn the symptoms of low and high blood sugar and how to manage them. Always carry a quick-source of sugar with you in case you have symptoms of low blood sugar. Examples include hard sugar candy or glucose tablets. Make sure others know that you can choke if you eat or drink when you develop serious symptoms of low blood sugar, such as seizures or unconsciousness. They must get medical help at once. Tell your doctor or health care professional if you have high blood sugar. You might need to change the dose of your medicine. If you are sick or exercising more than usual, you might need to change the dose of your medicine. Do not skip meals. Ask your doctor or health care professional if you should avoid alcohol. Many nonprescription cough and cold products contain sugar or alcohol. These can affect blood sugar. This medicine may cause ovulation in premenopausal women who do not have regular monthly periods. This may increase your chances of becoming pregnant. You should not take this medicine if you become pregnant or think you may be pregnant. Talk with your doctor or health care professional about your birth control options while taking this medicine. Contact your doctor or health care professional right away if think you are pregnant. The tablet shell for some brands of this medicine does not dissolve. This is normal. The tablet shell may appear whole in the stool. This is not a cause for concern. If you are going to need surgery, a MRI, CT scan, or other procedure, tell your doctor that you are taking this medicine. You may need to stop taking this medicine before the procedure. Wear a medical ID bracelet or chain, and carry a card that describes your disease and details of your medicine and dosage times. What side effects may I notice from receiving this medicine? Side effects that you should report to your doctor or health care professional as soon as  possible: -allergic reactions like skin rash, itching or hives, swelling of the face, lips, or tongue -breathing problems -feeling faint or lightheaded, falls -muscle aches or pains -signs and symptoms of low blood sugar such as feeling anxious, confusion, dizziness, increased hunger, unusually weak or tired, sweating, shakiness, cold, irritable, headache, blurred vision, fast heartbeat, loss of consciousness -slow or irregular heartbeat -unusual stomach pain or discomfort -unusually tired or weak Side effects that usually do not require medical attention (report to your doctor or health care professional if they continue or are bothersome): -diarrhea -headache -heartburn -metallic taste in mouth -nausea -stomach gas, upset This list may not describe all possible side effects. Call your doctor for medical advice about side effects. You may report side effects to FDA at 1-800-FDA-1088. Where should I keep my medicine? Keep out of the reach of children. Store at room temperature between 15 and 30 degrees C (59 and 86 degrees F). Protect from light. Throw away any unused medicine after the expiration date. NOTE: This sheet is a summary. It may not cover all possible information. If you have questions about this medicine, talk to your doctor, pharmacist, or health care provider.  2015, Elsevier/Gold Standard. (2012-12-16 15:57:50)  Lisinopril tablets What is this medicine? LISINOPRIL (lyse IN [place] pril) is an ACE inhibitor. This medicine is used to treat high blood pressure and heart failure. It is also used  to protect the heart immediately after a heart attack. This medicine may be used for other purposes; ask your health care provider or pharmacist if you have questions. COMMON BRAND NAME(S): Prinivil, Zestril What should I tell my health care provider before I take this medicine? They need to know if you have any of these conditions: -diabetes -heart or blood vessel disease -immune  system disease like lupus or scleroderma -kidney disease -low blood pressure -previous swelling of the tongue, face, or lips with difficulty breathing, difficulty swallowing, hoarseness, or tightening of the throat -an unusual or allergic reaction to lisinopril, other ACE inhibitors, insect venom, foods, dyes, or preservatives -pregnant or trying to get pregnant -breast-feeding How should I use this medicine? Take this medicine by mouth with a glass of water. Follow the directions on your prescription label. You may take this medicine with or without food. Take your medicine at regular intervals. Do not stop taking this medicine except on the advice of your doctor or health care professional. Talk to your pediatrician regarding the use of this medicine in children. Special care may be needed. While this drug may be prescribed for children as young as 100 years of age for selected conditions, precautions do apply. Overdosage: If you think you have taken too much of this medicine contact a poison control center or emergency room at once. NOTE: This medicine is only for you. Do not share this medicine with others. What if I miss a dose? If you miss a dose, take it as soon as you can. If it is almost time for your next dose, take only that dose. Do not take double or extra doses. What may interact with this medicine? -diuretics -lithium -NSAIDs, medicines for pain and inflammation, like ibuprofen or naproxen -over-the-counter herbal supplements like hawthorn -potassium salts or potassium supplements -salt substitutes This list may not describe all possible interactions. Give your health care provider a list of all the medicines, herbs, non-prescription drugs, or dietary supplements you use. Also tell them if you smoke, drink alcohol, or use illegal drugs. Some items may interact with your medicine. What should I watch for while using this medicine? Visit your doctor or health care professional for  regular check ups. Check your blood pressure as directed. Ask your doctor what your blood pressure should be, and when you should contact him or her. Call your doctor or health care professional if you notice an irregular or fast heart beat. Women should inform their doctor if they wish to become pregnant or think they might be pregnant. There is a potential for serious side effects to an unborn child. Talk to your health care professional or pharmacist for more information. Check with your doctor or health care professional if you get an attack of severe diarrhea, nausea and vomiting, or if you sweat a lot. The loss of too much body fluid can make it dangerous for you to take this medicine. You may get drowsy or dizzy. Do not drive, use machinery, or do anything that needs mental alertness until you know how this drug affects you. Do not stand or sit up quickly, especially if you are an older patient. This reduces the risk of dizzy or fainting spells. Alcohol can make you more drowsy and dizzy. Avoid alcoholic drinks. Avoid salt substitutes unless you are told otherwise by your doctor or health care professional. Do not treat yourself for coughs, colds, or pain while you are taking this medicine without asking your doctor or health care  professional for advice. Some ingredients may increase your blood pressure. What side effects may I notice from receiving this medicine? Side effects that you should report to your doctor or health care professional as soon as possible: -abdominal pain with or without nausea or vomiting -allergic reactions like skin rash or hives, swelling of the hands, feet, face, lips, throat, or tongue -dark urine -difficulty breathing -dizzy, lightheaded or fainting spell -fever or sore throat -irregular heart beat, chest pain -pain or difficulty passing urine -redness, blistering, peeling or loosening of the skin, including inside the mouth -unusually weak -yellowing of the  eyes or skin Side effects that usually do not require medical attention (report to your doctor or health care professional if they continue or are bothersome): -change in taste -cough -decreased sexual function or desire -headache -sun sensitivity -tiredness This list may not describe all possible side effects. Call your doctor for medical advice about side effects. You may report side effects to FDA at 1-800-FDA-1088. Where should I keep my medicine? Keep out of the reach of children. Store at room temperature between 15 and 30 degrees C (59 and 86 degrees F). Protect from moisture. Keep container tightly closed. Throw away any unused medicine after the expiration date. NOTE: This sheet is a summary. It may not cover all possible information. If you have questions about this medicine, talk to your doctor, pharmacist, or health care provider.  2015, Elsevier/Gold Standard. (2008-03-08 17:36:32)

## 2014-09-23 NOTE — Progress Notes (Signed)
Case discussed with Dr. Hoffman soon after the resident saw the patient.  We reviewed the resident's history and exam and pertinent patient test results.  I agree with the assessment, diagnosis, and plan of care documented in the resident's note. 

## 2014-09-27 NOTE — Telephone Encounter (Signed)
closed

## 2014-11-16 ENCOUNTER — Encounter: Payer: Self-pay | Admitting: Pulmonary Disease

## 2014-11-18 NOTE — Addendum Note (Signed)
Addended by: Remus BlakeBARROW, Atia Haupt K on: 11/18/2014 03:43 PM   Modules accepted: Orders

## 2014-12-28 ENCOUNTER — Ambulatory Visit (INDEPENDENT_AMBULATORY_CARE_PROVIDER_SITE_OTHER): Payer: Self-pay | Admitting: Internal Medicine

## 2014-12-28 ENCOUNTER — Encounter: Payer: Self-pay | Admitting: Internal Medicine

## 2014-12-28 VITALS — BP 127/82 | HR 57 | Temp 98.0°F | Ht 73.0 in | Wt 198.7 lb

## 2014-12-28 DIAGNOSIS — E1165 Type 2 diabetes mellitus with hyperglycemia: Secondary | ICD-10-CM

## 2014-12-28 DIAGNOSIS — IMO0002 Reserved for concepts with insufficient information to code with codable children: Secondary | ICD-10-CM

## 2014-12-28 DIAGNOSIS — I10 Essential (primary) hypertension: Secondary | ICD-10-CM

## 2014-12-28 DIAGNOSIS — E1122 Type 2 diabetes mellitus with diabetic chronic kidney disease: Secondary | ICD-10-CM

## 2014-12-28 DIAGNOSIS — N182 Chronic kidney disease, stage 2 (mild): Secondary | ICD-10-CM

## 2014-12-28 DIAGNOSIS — K649 Unspecified hemorrhoids: Secondary | ICD-10-CM

## 2014-12-28 DIAGNOSIS — E1322 Other specified diabetes mellitus with diabetic chronic kidney disease: Secondary | ICD-10-CM

## 2014-12-28 DIAGNOSIS — E1365 Other specified diabetes mellitus with hyperglycemia: Secondary | ICD-10-CM

## 2014-12-28 DIAGNOSIS — I129 Hypertensive chronic kidney disease with stage 1 through stage 4 chronic kidney disease, or unspecified chronic kidney disease: Secondary | ICD-10-CM

## 2014-12-28 LAB — POCT GLYCOSYLATED HEMOGLOBIN (HGB A1C): Hemoglobin A1C: 6.4

## 2014-12-28 LAB — BASIC METABOLIC PANEL WITH GFR
BUN: 12 mg/dL (ref 6–23)
CO2: 25 mEq/L (ref 19–32)
CREATININE: 1.2 mg/dL (ref 0.50–1.35)
Calcium: 9.1 mg/dL (ref 8.4–10.5)
Chloride: 104 mEq/L (ref 96–112)
GFR, EST AFRICAN AMERICAN: 82 mL/min
GFR, Est Non African American: 71 mL/min
Glucose, Bld: 86 mg/dL (ref 70–99)
Potassium: 4.2 mEq/L (ref 3.5–5.3)
SODIUM: 139 meq/L (ref 135–145)

## 2014-12-28 LAB — GLUCOSE, CAPILLARY: Glucose-Capillary: 95 mg/dL (ref 70–99)

## 2014-12-28 MED ORDER — HYDROCORTISONE ACETATE 1 % EX OINT: 1.0000 [IU] | TOPICAL_OINTMENT | CUTANEOUS | Status: DC | PRN

## 2014-12-28 MED ORDER — DOCUSATE SODIUM 100 MG PO CAPS: 100.0000 mg | ORAL_CAPSULE | Freq: Every day | ORAL | Status: DC | PRN

## 2014-12-28 NOTE — Assessment & Plan Note (Signed)
Lab Results  Component Value Date   HGBA1C 6.4 12/28/2014   HGBA1C 7.2 09/21/2014   HGBA1C 6.6 07/05/2014     Assessment: Diabetes control: good control (HgbA1C at goal) Progress toward A1C goal:  at goal Comments:   Plan: Medications:  Metformin XR 500mg  daily Home glucose monitoring: Frequency:   Timing:   Instruction/counseling given: discussed diet Educational resources provided: brochure, handout Self management tools provided:   Other plans:

## 2014-12-28 NOTE — Patient Instructions (Addendum)
General Instructions:  You A1c was 6.4, you are doing great.  Continue your medications.  Please bring your medicines with you each time you come to clinic.  Medicines may include prescription medications, over-the-counter medications, herbal remedies, eye drops, vitamins, or other pills.   Progress Toward Treatment Goals:  Treatment Goal 12/28/2014  Hemoglobin A1C at goal  Blood pressure at goal    Self Care Goals & Plans:  Self Care Goal 12/28/2014  Manage my medications take my medicines as prescribed; bring my medications to every visit; refill my medications on time; follow the sick day instructions if I am sick  Monitor my health keep track of my weight; check my feet daily  Eat healthy foods eat more vegetables; eat fruit for snacks and desserts; eat baked foods instead of fried foods; eat foods that are low in salt; eat smaller portions; drink diet soda or water instead of juice or soda  Be physically active find an activity I enjoy    Home Blood Glucose Monitoring 09/21/2014  Check my blood sugar no home glucose monitoring     Care Management & Community Referrals:  Referral 09/21/2014  Referrals made for care management support none needed

## 2014-12-28 NOTE — Assessment & Plan Note (Addendum)
BP Readings from Last 3 Encounters:  12/28/14 127/82  09/21/14 159/92  09/21/14 154/92    Lab Results  Component Value Date   NA 137 12/28/2013   K 4.1 12/28/2013   CREATININE 1.19 12/28/2013    Assessment: Blood pressure control: controlled Progress toward BP goal:  at goal Comments: Reports breaking up with GF helped too.  Plan: Medications:  Lisinopril 5mg  daily Educational resources provided: brochure, handout, video Self management tools provided:   Other plans: recheck BMP today.

## 2014-12-28 NOTE — Progress Notes (Signed)
[facility] Subjective:   [name]   DOB: 02/28/1966 49 y.o.   MRN: [id number]  HPI: [name] is a 49 y.o. male with a PMH detailed below who presents for 3 month follow up of HTN and DM  HTN: At last visit was started on 5mg  of Lisinopril daily.  He denies any side effects.  DM2: At last visit was started on Metformin 500mg  XR.  He reports he is taking this and has no issues.  CKD: He has been looking on mychart and has noted CKD and for some reason has the belief that there is something wrong with his left kidney.  I reassured him about the diagnosis of CKD (his is very mild and needs to be monitored and have BP and DM control), and that we have no evidence of a problem with specifically his left kidney.  Past Medical History  Diagnosis Date  . Asthma   . Chronic back pain   . Bilateral chronic knee pain   . Chronic shoulder pain   . Hypertension   . Reflux   . Depression   . Hx of echocardiogram     a. Echo 10/24/12:  EF 60-65%, normal wall thickness, normal diastolic fxn  . Hx of cardiovascular stress test     a. Lexiscan Myoview 10/29/12:  EF 56%, no ischemia  . Allergy   . Hyperlipidemia    Current Outpatient Prescriptions  Medication Sig Dispense Refill  . acetaminophen (TYLENOL) 500 MG tablet Take 1 tablet (500 mg total) by mouth every 6 (six) hours as needed. 30 tablet 0  . albuterol (PROVENTIL HFA;VENTOLIN HFA) 108 (90 BASE) MCG/ACT inhaler Inhale 2 puffs into the lungs every 6 (six) hours as needed for wheezing or shortness of breath. 1 Inhaler 4  . beclomethasone (QVAR) 80 MCG/ACT inhaler Inhale 2 puffs into the lungs 2 (two) times daily. 1 Inhaler 2  . cyclobenzaprine (FLEXERIL) 10 MG tablet Take 1 tablet (10 mg total) by mouth 3 (three) times daily as needed for muscle spasms. 60 tablet 1  . docusate sodium (COLACE) 100 MG capsule Take 1-2 capsules (100-200 mg total) by mouth daily as needed. 30 capsule 1  .  Fluticasone-Salmeterol (ADVAIR DISKUS) 100-50 MCG/DOSE AEPB Inhale 1 puff into the lungs 2 (two) times daily. 14 each 10  . Hydrocortisone Acetate 1 % OINT Apply 1 Units topically as needed.  0  . lisinopril (PRINIVIL,ZESTRIL) 5 MG tablet Take 1 tablet (5 mg total) by mouth daily. 90 tablet 3  . lovastatin (MEVACOR) 20 MG tablet Take 1 tablet (20 mg total) by mouth daily. 30 tablet 11  . metFORMIN (GLUCOPHAGE XR) 500 MG 24 hr tablet Take 1 tablet (500 mg total) by mouth daily with breakfast. 90 tablet 3   No current facility-administered medications for this visit.   Family History  Problem Relation Age of Onset  . Hypertension Other   . Diabetes Other   . Heart attack Other   . Diabetes Mother   . Hypertension Mother   . Hyperlipidemia Neg Hx   . Sudden death Neg Hx   . Hypertension Sister   . Stroke Sister   . Hypertension Brother   . Asthma Daughter    History   Social History  . Marital Status: Divorced    Spouse Name: N/A  . Number of Children: N/A  . Years of Education: N/A   Social History Main Topics  . Smoking status: Never  Smoker   . Smokeless tobacco: Never Used  . Alcohol Use: Yes     Comment: 1 beer/night  . Drug Use: Yes    Special: Marijuana     Comment: daily MJ use for pain control.  . Sexual Activity: Yes    Birth Control/ Protection: Condom   Other Topics Concern  . None   Social History Narrative   Lives in [place] with significant other.   Review of Systems: Review of Systems  Constitutional: Negative for fever, chills, weight loss and malaise/fatigue.  Eyes: Negative for blurred vision.  Respiratory: Negative for cough and shortness of breath.   Cardiovascular: Negative for chest pain and leg swelling.  Gastrointestinal: Negative for heartburn and abdominal pain.  Genitourinary: Negative for dysuria.  Musculoskeletal: Positive for back pain, joint pain and neck pain. Negative for myalgias.  Neurological: Negative for dizziness and headaches.   Endo/Heme/Allergies: Negative for polydipsia.  Psychiatric/Behavioral: Negative for substance abuse.    Objective:  Physical Exam: Filed Vitals:   12/28/14 1004  BP: 127/82  Pulse: 57  Temp: 98 F (36.7 C)  TempSrc: Oral  Height:  (1.854 m)  Weight: 198 lb 11.2 oz (90.13 kg)  SpO2: 100%  Physical Exam  Constitutional: He appears well-developed and well-nourished. No distress.  Cardiovascular: Normal rate, regular rhythm and normal heart sounds.   Pulmonary/Chest: Effort normal and breath sounds normal.  Nursing note and vitals reviewed.   Assessment & Plan:  Case discussed with Dr. Criselda Peaches  Hemorrhoids Patient reports some blood on toliet paper when wiping and a sensation of some rectal fullness.  He has already diagnosed himself with hemorrhoids and declines my exam today.   -recommend stool softener to avoid straining. - Can use OTC hydrocortisone. - If severe symptoms will return for evaluation and potentially referral to GI for possible treatment.   Essential hypertension BP Readings from Last 3 Encounters:  12/28/14 127/82  09/21/14 159/92  09/21/14 154/92    Lab Results  Component Value Date   NA 137 12/28/2013   K 4.1 12/28/2013   CREATININE 1.19 12/28/2013    Assessment: Blood pressure control: controlled Progress toward BP goal:  at goal Comments: Reports breaking up with GF helped too.  Plan: Medications:  Lisinopril  daily Educational resources provided: brochure, handout, video Self management tools provided:   Other plans: recheck BMP today.     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 Hydrocortisone Acetate 1 % OINT    Sig: Apply 1 Units topically as needed.    Refill:  0  . docusate sodium (COLACE) 100 MG capsule    Sig: Take 1-2 capsules (100-200 mg total) by mouth daily as needed.    Dispense:  30 capsule    Refill:  1   Other Orders Orders Placed This Encounter  Procedures  . BMP with Estimated GFR ([id number])  . Glucose, capillary  . POC Hbg A1C  .  Retinal/fundus photography    Standing Status: Future     Number of Occurrences:      Standing Expiration Date: 12/28/2015

## 2014-12-28 NOTE — Assessment & Plan Note (Signed)
Patient reports some blood on toliet paper when wiping and a sensation of some rectal fullness.  He has already diagnosed himself with hemorrhoids and declines my exam today.   -recommend stool softener to avoid straining. - Can use OTC hydrocortisone. - If severe symptoms will return for evaluation and potentially referral to GI for possible treatment.

## 2014-12-29 NOTE — Progress Notes (Signed)
Internal Medicine Clinic Attending  Case discussed with Dr. Hoffman soon after the resident saw the patient.  We reviewed the resident's history and exam and pertinent patient test results.  I agree with the assessment, diagnosis, and plan of care documented in the resident's note. 

## 2014-12-29 NOTE — Addendum Note (Signed)
Addended by: Neomia DearPOWERS, Rogelio Winbush E on: 12/29/2014 09:48 AM   Modules accepted: Orders

## 2015-01-06 ENCOUNTER — Ambulatory Visit: Payer: Self-pay

## 2015-01-13 ENCOUNTER — Other Ambulatory Visit: Payer: Self-pay | Admitting: *Deleted

## 2015-01-13 DIAGNOSIS — M5010 Cervical disc disorder with radiculopathy, unspecified cervical region: Secondary | ICD-10-CM

## 2015-01-18 ENCOUNTER — Other Ambulatory Visit: Payer: Self-pay | Admitting: Sports Medicine

## 2015-01-18 DIAGNOSIS — M5412 Radiculopathy, cervical region: Secondary | ICD-10-CM

## 2015-01-18 DIAGNOSIS — M5090 Cervical disc disorder, unspecified, unspecified cervical region: Secondary | ICD-10-CM

## 2015-01-20 ENCOUNTER — Encounter: Payer: Self-pay | Admitting: Pulmonary Disease

## 2015-01-20 ENCOUNTER — Ambulatory Visit (INDEPENDENT_AMBULATORY_CARE_PROVIDER_SITE_OTHER): Payer: Self-pay | Admitting: Pulmonary Disease

## 2015-01-20 VITALS — BP 117/78 | HR 75 | Temp 98.4°F | Ht 73.0 in | Wt 195.5 lb

## 2015-01-20 DIAGNOSIS — K209 Esophagitis, unspecified without bleeding: Secondary | ICD-10-CM

## 2015-01-20 DIAGNOSIS — Z0000 Encounter for general adult medical examination without abnormal findings: Secondary | ICD-10-CM

## 2015-01-20 DIAGNOSIS — I10 Essential (primary) hypertension: Secondary | ICD-10-CM

## 2015-01-20 DIAGNOSIS — K649 Unspecified hemorrhoids: Secondary | ICD-10-CM

## 2015-01-20 DIAGNOSIS — E785 Hyperlipidemia, unspecified: Secondary | ICD-10-CM

## 2015-01-20 DIAGNOSIS — M79672 Pain in left foot: Secondary | ICD-10-CM

## 2015-01-20 LAB — LIPID PANEL
CHOLESTEROL: 210 mg/dL — AB (ref 0–200)
HDL: 59 mg/dL (ref >=40)
LDL Cholesterol: 132 mg/dL — ABNORMAL HIGH (ref 0–99)
Total CHOL/HDL Ratio: 3.6 Ratio
Triglycerides: 93 mg/dL (ref <150)
VLDL: 19 mg/dL (ref 0–40)

## 2015-01-20 LAB — GLUCOSE, CAPILLARY: Glucose-Capillary: 95 mg/dL (ref 70–99)

## 2015-01-20 MED ORDER — DICLOFENAC SODIUM 1 % TD GEL: 2.0000 g | Freq: Four times a day (QID) | TRANSDERMAL | Status: DC

## 2015-01-20 MED ORDER — BECLOMETHASONE DIPROPIONATE 80 MCG/ACT IN AERS: 2.0000 | INHALATION_SPRAY | Freq: Two times a day (BID) | RESPIRATORY_TRACT | Status: DC

## 2015-01-20 MED ORDER — POLYETHYLENE GLYCOL 3350 17 G PO PACK: 17.0000 g | PACK | Freq: Every day | ORAL | Status: DC

## 2015-01-20 MED ORDER — PANTOPRAZOLE SODIUM 40 MG PO TBEC: 40.0000 mg | DELAYED_RELEASE_TABLET | Freq: Every day | ORAL | Status: DC

## 2015-01-20 NOTE — Patient Instructions (Signed)
Foot pain:  You may use the Voltaren gel on your feet. Rest and use ice. If your foot does not get better after a week or if the pain gets worse, please talk to your Sports Medicine doctor or come back to our clinic.

## 2015-01-20 NOTE — Assessment & Plan Note (Signed)
Assessment: Likely tendinopathy as patient is able to bear weight.   Plan:  -Conservative management with rest, ice, volteran gel, tylenol prn.  -If the pain fails to improve or worsen, will need further work up with imaging. -Podiatry referral placed.

## 2015-01-20 NOTE — Progress Notes (Signed)
Subjective:   Patient ID: Javier Beasley, male    DOB: 07/05/1966, 49 y.o.   MRN: [id number]  HPI Javier Beasley is a 49 year old man with history of asthma, HTN, HLD, chronic knee/shoulder/back pain presenting for follow up.  L foot pain x 5 days. Top lateral foot. No trauma. Worse in morning. Gets better throughout the day. Moving and aspercreme helps the pain. Has been limping. Can bear weight. No paresthesias.   Chronic R knee pain has been worse than usual. Aches. Has tried ice, epsom salt soak, stretching.  Chronic back pain has been worse as well. Following with Dr. Margaretha Sheffieldraper.  Stomach pains comes and goes. Crampy. Started 2 weeks ago. Has been coming less often - every other day. Stomach pain occurs with lying down. Feels like dysphagia symptoms are coming back - previously diagnosed with esophagitis in 10/24/2012. Has reflux.  +hemorrhoids. Last BM yesterday. Soft. Every other day. Denies hematochezia or melena.   Review of Systems Constitutional: no fevers/chills Eyes: no vision changes Ears, nose, mouth, throat, and face: +occasional cough Respiratory: no shortness of breath Cardiovascular: no chest pain Gastrointestinal: no nausea/vomiting, no abdominal pain, no constipation, no diarrhea Genitourinary: no dysuria, no hematuria Integument: no rash Hematologic/lymphatic: no bleeding/bruising, no edema Musculoskeletal: no arthralgias, no myalgias Neurological: no paresthesias, no weakness  Past Medical History  Diagnosis Date  . Asthma   . Chronic back pain   . Bilateral chronic knee pain   . Chronic shoulder pain   . Hypertension   . Reflux   . Depression   . Hx of echocardiogram     a. Echo 10/24/12:  EF 60-65%, normal wall thickness, normal diastolic fxn  . Hx of cardiovascular stress test     a. Lexiscan Myoview 10/29/12:  EF 56%, no ischemia  . Allergy   . Hyperlipidemia     Current Outpatient Prescriptions on File Prior to Visit  Medication Sig Dispense Refill   . acetaminophen (TYLENOL) 500 MG tablet Take 1 tablet (500 mg total) by mouth every 6 (six) hours as needed. 30 tablet 0  . albuterol (PROVENTIL HFA;VENTOLIN HFA) 108 (90 BASE) MCG/ACT inhaler Inhale 2 puffs into the lungs every 6 (six) hours as needed for wheezing or shortness of breath. 1 Inhaler 4  . beclomethasone (QVAR) 80 MCG/ACT inhaler Inhale 2 puffs into the lungs 2 (two) times daily. 1 Inhaler 2  . cyclobenzaprine (FLEXERIL) 10 MG tablet Take 1 tablet (10 mg total) by mouth 3 (three) times daily as needed for muscle spasms. 60 tablet 1  . docusate sodium (COLACE) 100 MG capsule Take 1-2 capsules (100-200 mg total) by mouth daily as needed. 30 capsule 1  . Fluticasone-Salmeterol (ADVAIR DISKUS) 100-50 MCG/DOSE AEPB Inhale 1 puff into the lungs 2 (two) times daily. 14 each 10  . Hydrocortisone Acetate 1 % OINT Apply 1 Units topically as needed.  0  . lisinopril (PRINIVIL,ZESTRIL) 5 MG tablet Take 1 tablet (5 mg total) by mouth daily. 90 tablet 3  . lovastatin (MEVACOR) 20 MG tablet Take 1 tablet (20 mg total) by mouth daily. 30 tablet 11  . metFORMIN (GLUCOPHAGE XR) 500 MG 24 hr tablet Take 1 tablet (500 mg total) by mouth daily with breakfast. 90 tablet 3   No current facility-administered medications on file prior to visit.    Today's Vitals   01/20/15 1003 01/20/15 1004  BP: 117/78   Pulse: 75   Temp: 98.4 F (36.9 C)   TempSrc: Oral  Height: 6\' 1"  (1.854 m)   Weight: 195 lb 8 oz (88.678 kg)   SpO2: 98%   PainSc:  8    Objective:  Physical Exam  Constitutional: He is oriented to person, place, and time. He appears well-developed and well-nourished. No distress.  HENT:  Head: Normocephalic and atraumatic.  Eyes: Conjunctivae are normal.  Neck: Neck supple.  Cardiovascular: Normal rate, regular rhythm and normal heart sounds.   Pulmonary/Chest: Effort normal and breath sounds normal. He has no wheezes. He has no rales.  Abdominal: Soft. He exhibits no distension.    Musculoskeletal: Normal range of motion.       Feet:  Neurological: He is alert and oriented to person, place, and time.  Skin: Skin is warm and dry.  Psychiatric: He has a normal mood and affect.   Assessment & Plan:  Please refer to problem based charting.

## 2015-01-21 MED ORDER — ATORVASTATIN CALCIUM 40 MG PO TABS: 80.0000 mg | ORAL_TABLET | Freq: Every day | ORAL | Status: DC

## 2015-01-21 NOTE — Assessment & Plan Note (Signed)
Rx for Miralax daily

## 2015-01-21 NOTE — Assessment & Plan Note (Addendum)
Lipid Panel     Component Value Date/Time   CHOL 210* 01/20/2015 1104   TRIG 93 01/20/2015 1104   HDL 59 01/20/2015 1104   CHOLHDL 3.6 01/20/2015 1104   VLDL 19 01/20/2015 1104   LDLCALC 132* 01/20/2015 1104    His 10 year risk of heart disease or stroke is 12.1%.   Plan: -Change to atorvastatin 80mg  daily. -Recheck lipid panel in 4-12 weeks.

## 2015-01-21 NOTE — Assessment & Plan Note (Addendum)
BP Readings from Last 3 Encounters:  01/20/15 117/78  12/28/14 127/82  09/21/14 159/92    Lab Results  Component Value Date   NA 139 12/28/2014   K 4.2 12/28/2014   CREATININE 1.20 12/28/2014    Assessment: Blood pressure control: controlled Progress toward BP goal:  at goal  Plan: Medications:  continue current medications including lisinopril 5mg  daily

## 2015-01-21 NOTE — Assessment & Plan Note (Signed)
Assessment: Likely recurrent. Has not been on PPI.  Plan:  -Restart pantoprazole 40mg  daily -Follow up in 2 months to evaluate -If worse or no improvement will need to be referred back to GI.

## 2015-01-23 NOTE — Progress Notes (Signed)
Medicine attending: Medical history, presenting problems, physical findings, and medications, reviewed with Dr Jennifer Krall on the day of the patient visit and I concur with her evaluation and management plan. 

## 2015-01-24 ENCOUNTER — Ambulatory Visit
Admission: RE | Admit: 2015-01-24 | Discharge: 2015-01-24 | Disposition: A | Discharge status: Home / Self Care | Payer: No Typology Code available for payment source | Source: Ambulatory Visit | Attending: Sports Medicine | Admitting: Sports Medicine

## 2015-01-24 DIAGNOSIS — M5412 Radiculopathy, cervical region: Secondary | ICD-10-CM

## 2015-01-24 DIAGNOSIS — M5090 Cervical disc disorder, unspecified, unspecified cervical region: Secondary | ICD-10-CM

## 2015-01-24 MED ORDER — TRIAMCINOLONE ACETONIDE 40 MG/ML IJ SUSP (RADIOLOGY)
60.0000 mg | Freq: Once | INTRAMUSCULAR | Status: AC
  Administered 2015-01-24: 60 mg via EPIDURAL

## 2015-01-24 MED ORDER — IOHEXOL 300 MG/ML  SOLN
1.0000 mL | Freq: Once | INTRAMUSCULAR | Status: AC | PRN
  Administered 2015-01-24: 1 mL via EPIDURAL

## 2015-01-24 NOTE — Discharge Instructions (Signed)

## 2015-01-31 ENCOUNTER — Encounter: Payer: Self-pay | Admitting: Sports Medicine

## 2015-01-31 ENCOUNTER — Ambulatory Visit (INDEPENDENT_AMBULATORY_CARE_PROVIDER_SITE_OTHER): Payer: No Typology Code available for payment source | Admitting: Sports Medicine

## 2015-01-31 ENCOUNTER — Other Ambulatory Visit (INDEPENDENT_AMBULATORY_CARE_PROVIDER_SITE_OTHER): Payer: No Typology Code available for payment source

## 2015-01-31 VITALS — BP 120/81 | HR 61 | Ht 73.0 in | Wt 195.0 lb

## 2015-01-31 DIAGNOSIS — M5030 Other cervical disc degeneration, unspecified cervical region: Secondary | ICD-10-CM

## 2015-01-31 DIAGNOSIS — M2550 Pain in unspecified joint: Secondary | ICD-10-CM

## 2015-01-31 DIAGNOSIS — M5136 Other intervertebral disc degeneration, lumbar region: Secondary | ICD-10-CM

## 2015-01-31 LAB — C-REACTIVE PROTEIN: CRP: <0.5 mg/dL (ref <0.60)

## 2015-01-31 LAB — RHEUMATOID FACTOR

## 2015-01-31 NOTE — Progress Notes (Signed)
   Subjective:    Patient ID: Javier Beasley, male    DOB: 01-16-1966, 49 y.o.   MRN: [id number]  HPI chief complaint: Bilateral arm and bilateral leg pain  Patient comes in today complaining of chronic bilateral arm, shoulder, and leg pain. An MRI of his cervical spine done in December 2015 showed moderate bilateral neural foraminal stenosis at C5-C6 and C6-C7. He underwent a cervical ESI which provided him with good symptom relief but unfortunately a second injection was not as helpful. He complains of neck pain that radiates into each shoulder and he is also getting radiculopathy down the right arm and into the right hand. Also complaining of bilateral lower extremity pain and weakness. An MRI of his lumbar spine done in 2013 showed mild multilevel degenerative changes. He is concerned that there may be a genetic reason for his discomfort. He states that he has several family members with similar problems. He also has a disability hearing coming up later this week.    Review of Systems     Objective:   Physical Exam Well-developed, well-nourished. No acute distress.  Neurological exam: There is no obvious atrophy. I do not appreciate any focal neurological deficit of either his upper or lower extremities. Reflexes are equal at the biceps, triceps, brachial radialis, patellar, and Achilles tendons bilaterally. Sensation is grossly intact to light touch.  No noticeable joint hypertrophy or effusion.       Assessment & Plan:  Cervical degenerative disc disease Lumbar degenerative disc disease  Since the patient is concerned about some sort of genetic predisposition to his problems I will check a rheumatological panel including a sedimentation rate, C-reactive protein, ANA, rheumatoid factor, anti-CCP antibody, and HLA-B27. I will call him with those results later this week once available. It is quite possible that his symptoms are originating from fibromyalgia and if his blood work is  unremarkable I would recommend that he follow-up with his PCP to discuss this further. He certainly has degenerative changes in both his cervical spine and lumbar spine but I do not see anything significant enough to warrant surgery. Since he did not receive a positive response to his most recent cervical ESI I do not think that it would benefit him to proceed with another injection at this time.

## 2015-02-01 ENCOUNTER — Emergency Department (HOSPITAL_COMMUNITY)
Admission: EM | Admit: 2015-02-01 | Discharge: 2015-02-01 | Disposition: A | Discharge status: Home / Self Care | Payer: No Typology Code available for payment source | Attending: Emergency Medicine | Admitting: Emergency Medicine

## 2015-02-01 ENCOUNTER — Encounter: Payer: Self-pay | Admitting: Pulmonary Disease

## 2015-02-01 ENCOUNTER — Encounter: Payer: Self-pay | Admitting: *Deleted

## 2015-02-01 ENCOUNTER — Encounter (HOSPITAL_COMMUNITY): Payer: Self-pay | Admitting: Emergency Medicine

## 2015-02-01 DIAGNOSIS — I129 Hypertensive chronic kidney disease with stage 1 through stage 4 chronic kidney disease, or unspecified chronic kidney disease: Secondary | ICD-10-CM | POA: Insufficient documentation

## 2015-02-01 DIAGNOSIS — N189 Chronic kidney disease, unspecified: Secondary | ICD-10-CM | POA: Insufficient documentation

## 2015-02-01 DIAGNOSIS — J45909 Unspecified asthma, uncomplicated: Secondary | ICD-10-CM | POA: Insufficient documentation

## 2015-02-01 DIAGNOSIS — Z7952 Long term (current) use of systemic steroids: Secondary | ICD-10-CM | POA: Insufficient documentation

## 2015-02-01 DIAGNOSIS — E119 Type 2 diabetes mellitus without complications: Secondary | ICD-10-CM | POA: Insufficient documentation

## 2015-02-01 DIAGNOSIS — Z79899 Other long term (current) drug therapy: Secondary | ICD-10-CM | POA: Insufficient documentation

## 2015-02-01 DIAGNOSIS — G8929 Other chronic pain: Secondary | ICD-10-CM | POA: Insufficient documentation

## 2015-02-01 DIAGNOSIS — M25561 Pain in right knee: Secondary | ICD-10-CM

## 2015-02-01 DIAGNOSIS — K219 Gastro-esophageal reflux disease without esophagitis: Secondary | ICD-10-CM | POA: Insufficient documentation

## 2015-02-01 DIAGNOSIS — Z7951 Long term (current) use of inhaled steroids: Secondary | ICD-10-CM | POA: Insufficient documentation

## 2015-02-01 DIAGNOSIS — M79642 Pain in left hand: Secondary | ICD-10-CM | POA: Insufficient documentation

## 2015-02-01 DIAGNOSIS — Z791 Long term (current) use of non-steroidal anti-inflammatories (NSAID): Secondary | ICD-10-CM | POA: Insufficient documentation

## 2015-02-01 DIAGNOSIS — E785 Hyperlipidemia, unspecified: Secondary | ICD-10-CM | POA: Insufficient documentation

## 2015-02-01 DIAGNOSIS — F329 Major depressive disorder, single episode, unspecified: Secondary | ICD-10-CM | POA: Insufficient documentation

## 2015-02-01 HISTORY — DX: Type 2 diabetes mellitus without complications: E11.9

## 2015-02-01 HISTORY — DX: Disorder of kidney and ureter, unspecified: N28.9

## 2015-02-01 HISTORY — DX: Personal history of other diseases of the musculoskeletal system and connective tissue: Z87.39

## 2015-02-01 LAB — SEDIMENTATION RATE: SED RATE: 4 mm/h (ref 0–15)

## 2015-02-01 LAB — CYCLIC CITRUL PEPTIDE ANTIBODY, IGG: Cyclic Citrullin Peptide Ab: <2 U/mL (ref 0.0–5.0)

## 2015-02-01 LAB — ANA: ANA: NEGATIVE

## 2015-02-01 MED ORDER — TRAMADOL HCL 50 MG PO TABS
50.0000 mg | ORAL_TABLET | Freq: Once | ORAL | Status: AC
  Administered 2015-02-01: 50 mg via ORAL
  Filled 2015-02-01: qty 1

## 2015-02-01 MED ORDER — TRAMADOL HCL 50 MG PO TABS: 50.0000 mg | ORAL_TABLET | Freq: Four times a day (QID) | ORAL | Status: DC | PRN

## 2015-02-01 NOTE — Discharge Instructions (Signed)
As discussed, your right knee pain is likely due to irritation of the lateral soft tissue, including the meniscus. Please apply ice 4 times daily, use the pain medications provided, and be sure to follow-up with your orthopedist for further evaluation and management.  Similarly, your left hand pain is likely due to soft tissue irritation, and you should apply ice, 4 times daily to facilitate relief.  Return here for concerning changes in your condition.

## 2015-02-01 NOTE — ED Notes (Signed)
Patient c/o right knee pain and left hand pain. Patient states he has a history of arthritis. Patient states he is unable to support weight on the right leg, and that both the right knee and the left hand are swollen. Patient took flexeril @ 1 hour ago, states he is not receiving any relief at this time. Patient used heat last night without relief, states the heat caused his knee to ache more.

## 2015-02-01 NOTE — ED Provider Notes (Signed)
CSN: [id number]     Arrival date & time 02/01/15  [id number] History   First MD Initiated Contact with Patient 02/01/15 [phone]     Chief Complaint  Patient presents with  . Knee Pain    right  . Hand Pain    left    HPI  Patient presents with concern of right knee, left hand pain. 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 He did not fall, was not twisting, jumping, rebounding. However, the end of running along the side when he felt pain in the right lateral knee. Since yesterday the pain is been persistent, unrelenting, worse with ambulation, weightbearing. No loss of sensation or weakness distally. Currently the patient also complains of pain about the dorsum of the left hand, no clear precipitant. Patient is sore, does not interfere with motion, activity. Neither area of pain is improved with Tylenol, rest, ice. Patient was at his orthopedist yesterday for ongoing pain in multiple other areas. He received cortisone shot in the neck, with no change in pain in his typical distribution.   Past Medical History  Diagnosis Date  . Asthma   . Chronic back pain   . Bilateral chronic knee pain   . Chronic shoulder pain   . Hypertension   . Reflux   . Depression   . Hx of echocardiogram     a. Echo 10/24/12:  EF 60-65%, normal wall thickness, normal diastolic fxn  . Hx of cardiovascular stress test     a. Lexiscan Myoview 10/29/12:  EF 56%, no ischemia  . Allergy   . Hyperlipidemia   . Diabetes mellitus without complication   . Renal disorder     chronic kidney disease  . History of herniated intervertebral disc    Past Surgical History  Procedure Laterality Date  . Esophagogastroduodenoscopy Left 10/24/2012    Procedure: ESOPHAGOGASTRODUODENOSCOPY (EGD);  Surgeon: Willis ModenaWilliam Outlaw, MD;  [facility];  Service: Endoscopy;  Laterality: Left;  possible balloon dilation  . Balloon dilation N/A 10/24/2012    Procedure: BALLOON DILATION;  Surgeon: Willis ModenaWilliam  Outlaw, MD;  [facility];  Service: Endoscopy;  Laterality: N/A;   Family History  Problem Relation Age of Onset  . Hypertension Other   . Diabetes Other   . Heart attack Other   . Diabetes Mother   . Hypertension Mother   . Hyperlipidemia Neg Hx   . Sudden death Neg Hx   . Hypertension Sister   . Stroke Sister   . Hypertension Brother   . Asthma Daughter    History  Substance Use Topics  . Smoking status: Never Smoker   . Smokeless tobacco: Never Used  . Alcohol Use: 1.8 oz/week    0 Standard drinks or equivalent, 3 Cans of beer per week     Comment: Beer.    Review of Systems  Constitutional: Negative for fever.  Gastrointestinal: Negative for nausea.  Musculoskeletal:       Negative aside from HPI  Skin:       Negative aside from HPI  Allergic/Immunologic: Negative for immunocompromised state.  Neurological: Negative for weakness.    Allergies  Yeast-related products and Pork-derived products  Home Medications   Prior to Admission medications   Medication Sig Start Date End Date Taking? Authorizing Provider  acetaminophen (TYLENOL) 500 MG tablet Take 1 tablet (500 mg total) by mouth every 6 (six) hours as needed. 09/21/14   Gust RungErik C Hoffman, DO  albuterol (PROVENTIL HFA;VENTOLIN HFA) 108 (  90 BASE) MCG/ACT inhaler Inhale 2 puffs into the lungs every 6 (six) hours as needed for wheezing or shortness of breath. 12/28/13   Bobbye Charlestonhristopher B Komanski, MD  atorvastatin (LIPITOR) 40 MG tablet Take 2 tablets (80 mg total) by mouth daily. 01/21/15   Lora PaulaJennifer T Krall, MD  beclomethasone (QVAR) 80 MCG/ACT inhaler Inhale 2 puffs into the lungs 2 (two) times daily. 01/20/15   Lora PaulaJennifer T Krall, MD  cyclobenzaprine (FLEXERIL) 10 MG tablet Take 1 tablet (10 mg total) by mouth 3 (three) times daily as needed for muscle spasms. 09/21/14 09/21/15  Gust RungErik C Hoffman, DO  diclofenac sodium (VOLTAREN) 1 % GEL Apply 2 g topically 4 (four) times daily. 01/20/15   Lora PaulaJennifer T Krall, MD  docusate  sodium (COLACE) 100 MG capsule Take 1-2 capsules (100-200 mg total) by mouth daily as needed. 12/28/14 12/28/15  Gust RungErik C Hoffman, DO  Fluticasone-Salmeterol (ADVAIR DISKUS) 100-50 MCG/DOSE AEPB Inhale 1 puff into the lungs 2 (two) times daily. 12/28/13 12/28/14  Bobbye Charlestonhristopher B Komanski, MD  Hydrocortisone Acetate 1 % OINT Apply 1 Units topically as needed. 12/28/14   Gust RungErik C Hoffman, DO  lisinopril (PRINIVIL,ZESTRIL) 5 MG tablet Take 1 tablet (5 mg total) by mouth daily. 09/21/14 09/21/15  Gust RungErik C Hoffman, DO  metFORMIN (GLUCOPHAGE XR) 500 MG 24 hr tablet Take 1 tablet (500 mg total) by mouth daily with breakfast. 09/21/14 09/21/15  Gust RungErik C Hoffman, DO  pantoprazole (PROTONIX) 40 MG tablet Take 1 tablet (40 mg total) by mouth daily. 01/20/15   Lora PaulaJennifer T Krall, MD  polyethylene glycol [facility](MIRALAX / Ethelene HalGLYCOLAX) packet Take 17 g by mouth daily. 01/20/15   Lora PaulaJennifer T Krall, MD  traMADol (ULTRAM) 50 MG tablet Take 1 tablet (50 mg total) by mouth every 6 (six) hours as needed. 02/01/15   Gerhard Munchobert Dalisa Forrer, MD   BP 118/79 mmHg  Pulse 74  Temp(Src) 98.6 F (37 C) (Oral)  Resp 18  Ht 6\' 1"  (1.854 m)  Wt 198 lb (89.812 kg)  BMI 26.13 kg/m2  SpO2 98% Physical Exam  Constitutional: He is oriented to person, place, and time. He appears well-developed. No distress.  HENT:  Head: Normocephalic and atraumatic.  Eyes: Conjunctivae and EOM are normal.  Cardiovascular: Normal rate and regular rhythm.   Pulmonary/Chest: Effort normal. No stridor. No respiratory distress.  Abdominal: He exhibits no distension.  Musculoskeletal: He exhibits no edema.       Left knee: Normal.       Right ankle: Normal.       Arms:      Legs: Neurological: He is alert and oriented to person, place, and time. He exhibits normal muscle tone.  Skin: Skin is warm and dry.  Psychiatric: He has a normal mood and affect.  Nursing note and vitals reviewed.   ED Course  Procedures (including critical care time)  EMR notes that the patient was with his  orthopedist yesterday, and is currently being evaluated for diffuse pain; whether arthritis or fibromyalgia.   MDM   Final diagnoses:  Right knee pain  Hand pain, left    Patient presents with atraumatic pain in the right lateral knee, left hand. In both ears the patient is distally neurovascularly intact, neither area has evidence for laxity of the joint, nor deep space infection. 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  Gerhard Munchobert Shaaron Golliday, MD 02/01/15 [phone]

## 2015-02-01 NOTE — ED Notes (Signed)
PT HAS TAKEN MEDICATION BEFORE WITHOUT REACTION AND IS READY TO LEAVE.

## 2015-02-01 NOTE — ED Notes (Signed)
ORTHO APPLIED ACE AND CRUCTHES- [facility]

## 2015-02-04 ENCOUNTER — Encounter: Payer: Self-pay | Admitting: Pulmonary Disease

## 2015-02-04 ENCOUNTER — Other Ambulatory Visit: Payer: Self-pay | Admitting: *Deleted

## 2015-02-04 ENCOUNTER — Encounter: Payer: Self-pay | Admitting: Internal Medicine

## 2015-02-04 ENCOUNTER — Telehealth: Payer: Self-pay | Admitting: Sports Medicine

## 2015-02-04 LAB — HLA-B27 ANTIGEN: DNA Result:: NOT DETECTED

## 2015-02-04 MED ORDER — FLUTICASONE-SALMETEROL 100-50 MCG/DOSE IN AEPB: 1.0000 | INHALATION_SPRAY | Freq: Two times a day (BID) | RESPIRATORY_TRACT | Status: DC

## 2015-02-04 MED ORDER — ALBUTEROL SULFATE HFA 108 (90 BASE) MCG/ACT IN AERS: 2.0000 | INHALATION_SPRAY | Freq: Four times a day (QID) | RESPIRATORY_TRACT | Status: DC | PRN

## 2015-02-04 NOTE — Telephone Encounter (Signed)
Called to pharm 

## 2015-02-04 NOTE — Telephone Encounter (Signed)
Spoke with the patient on the phone today after reviewing his recent blood work. Everything is unremarkable. His HLA-B27 is still pending. I still think he may have an element of fibromyalgia that is responsible for some of his symptoms. He will discuss this with his PCP next week. I will have my office contact him once I have the results of his HLA-B27. As I've stated previously, he certainly has degenerative changes in both his lumbar and cervical spine but I do not believe he has any pathology significant enough for surgery. Follow-up with me when necessary.

## 2015-02-04 NOTE — Telephone Encounter (Signed)
-----   Message from Inez PilgrimMelanie L [facility] sent at 02/03/2015 12:04 PM EDT ----- Regarding: fyi Pt wanted you to know he was seen in the ED on 5/17.

## 2015-02-10 ENCOUNTER — Encounter: Payer: Self-pay | Admitting: Pulmonary Disease

## 2015-03-16 ENCOUNTER — Ambulatory Visit (INDEPENDENT_AMBULATORY_CARE_PROVIDER_SITE_OTHER): Payer: No Typology Code available for payment source | Admitting: Sports Medicine

## 2015-03-16 ENCOUNTER — Encounter: Payer: Self-pay | Admitting: Sports Medicine

## 2015-03-16 VITALS — BP 147/81 | HR 77 | Ht 73.0 in | Wt 195.0 lb

## 2015-03-16 DIAGNOSIS — M25561 Pain in right knee: Secondary | ICD-10-CM

## 2015-03-17 NOTE — Progress Notes (Signed)
   Subjective:    Patient ID: Javier Beasley, male    DOB: 08/23/1966, 49 y.o.   MRN: [id number]  HPI chief complaint: Right knee pain  Patient comes in today after having injured his right knee a few weeks ago. While playing basketball he suffered a noncontact injury while pivoting on the right foot. Felt a pop in his knee followed by immediate swelling. Was seen in the emergency room but no imaging was done. He was given an Ace wrap and a set of crutches. Since that time his swelling has improved but his pain persists. Pain is primarily along the lateral knee and is particularly noticeable with twisting or with going upstairs. No feelings of instability. He did have problems with this same knee back in 2014 and an MRI of his knee done at that time showed some inflammatory changes of the anterior suprapatellar fat pad but no evidence of meniscal tear or ligament disruption.  Interim medical history reviewed Medications reviewed Allergies reviewed    Review of Systems    as above Objective:   Physical Exam  Well-developed, well-nourished. No acute distress.  Right knee: Patient lacks about 5 of extension. Nearly full flexion. No effusion. Negative patellar apprehension. Knee is stable to valgus and varus stressing. Negative Lachmans, negative anterior drawer. Negative posterior drawer. There is tenderness to palpation along the lateral joint line with a positive Thessaly's. No tenderness medially. Neurovascularly intact distally. Walking with a limp.      Assessment & Plan:  Acute right knee pain-rule out meniscal tear  MRI of the right knee specifically to rule out a lateral meniscal tear. I gave the patient a new Ace wrap and he will continue to weight-bear as tolerated using his crutches. I will call him after I review the MRI at which point we will delineate further treatment.

## 2015-03-18 ENCOUNTER — Encounter: Payer: Self-pay | Admitting: Pulmonary Disease

## 2015-03-18 ENCOUNTER — Other Ambulatory Visit: Payer: Self-pay | Admitting: *Deleted

## 2015-03-18 ENCOUNTER — Encounter: Payer: Self-pay | Admitting: Internal Medicine

## 2015-03-19 ENCOUNTER — Ambulatory Visit
Admission: RE | Admit: 2015-03-19 | Discharge: 2015-03-19 | Disposition: A | Discharge status: Home / Self Care | Payer: No Typology Code available for payment source | Source: Ambulatory Visit | Attending: Sports Medicine | Admitting: Sports Medicine

## 2015-03-19 DIAGNOSIS — M25561 Pain in right knee: Secondary | ICD-10-CM

## 2015-03-21 MED ORDER — ALBUTEROL SULFATE HFA 108 (90 BASE) MCG/ACT IN AERS: 2.0000 | INHALATION_SPRAY | Freq: Four times a day (QID) | RESPIRATORY_TRACT | Status: DC | PRN

## 2015-03-22 ENCOUNTER — Ambulatory Visit: Payer: No Typology Code available for payment source | Admitting: Sports Medicine

## 2015-03-23 ENCOUNTER — Telehealth: Payer: Self-pay | Admitting: Sports Medicine

## 2015-03-23 DIAGNOSIS — M25561 Pain in right knee: Secondary | ICD-10-CM

## 2015-03-23 DIAGNOSIS — M25562 Pain in left knee: Principal | ICD-10-CM

## 2015-03-23 NOTE — Telephone Encounter (Signed)
[facility] Dr. Roda ShuttersXu Thursday July 7th at 4pm [address], [place], [place] [address] Phone: [phone]

## 2015-03-23 NOTE — Addendum Note (Signed)
Addended by: Annita BrodMOORE, Denyce Harr C on: 03/23/2015 12:16 PM   Modules accepted: Orders

## 2015-03-23 NOTE — Telephone Encounter (Signed)
I spoke with the patient on the phone today after reviewing the MRI of his right knee. MRI is compared to an MRI of the right knee done in April 2014. There is a new complex tear of the anterior body of the lateral meniscus. Also a small radial component with some accompanying mild subchondral edema. Anterior cruciate ligament appears degenerated but appears to be intact. 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 Medial and lateral compartments are fairly unremarkable in regards to degenerative change. Based on these findings I've recommended surgical consultation with Dr.Xu to discussed the merits of arthroscopic meniscectomy and debridement. Further treatment will be per the discretion of Dr.Xu and the patient will follow-up with me as needed.

## 2015-03-24 ENCOUNTER — Telehealth: Payer: Self-pay | Admitting: Internal Medicine

## 2015-03-24 DIAGNOSIS — J4540 Moderate persistent asthma, uncomplicated: Secondary | ICD-10-CM

## 2015-03-24 MED ORDER — ALBUTEROL SULFATE HFA 108 (90 BASE) MCG/ACT IN AERS: 2.0000 | INHALATION_SPRAY | Freq: Four times a day (QID) | RESPIRATORY_TRACT | Status: DC | PRN

## 2015-03-24 NOTE — Telephone Encounter (Signed)
Patient called after hours pager at 4:28 PM requesting albuterol inhaler. States he is out and cannot wait until tomorrow. He is currently being seen at Dr. Warren DanesXu's office (ortho), having some mild SOB, but in no distress. Says he uses his inhaler at least once daily. Not actively wheezing. Sent refill for Albuterol inhaler w/ 3 refills.   Lauris ChromanWoody Clarke Peretz, MD PGY-3, Internal Medicine Pager: [phone]

## 2015-04-05 ENCOUNTER — Other Ambulatory Visit (HOSPITAL_BASED_OUTPATIENT_CLINIC_OR_DEPARTMENT_OTHER): Payer: Self-pay | Admitting: Orthopaedic Surgery

## 2015-04-29 ENCOUNTER — Ambulatory Visit (INDEPENDENT_AMBULATORY_CARE_PROVIDER_SITE_OTHER): Payer: No Typology Code available for payment source | Admitting: Internal Medicine

## 2015-04-29 ENCOUNTER — Encounter: Payer: Self-pay | Admitting: Internal Medicine

## 2015-04-29 VITALS — BP 130/75 | HR 68 | Temp 98.2°F | Ht 73.0 in | Wt 200.6 lb

## 2015-04-29 DIAGNOSIS — K219 Gastro-esophageal reflux disease without esophagitis: Secondary | ICD-10-CM

## 2015-04-29 DIAGNOSIS — S83206A Unspecified tear of unspecified meniscus, current injury, right knee, initial encounter: Secondary | ICD-10-CM | POA: Insufficient documentation

## 2015-04-29 DIAGNOSIS — E785 Hyperlipidemia, unspecified: Secondary | ICD-10-CM

## 2015-04-29 DIAGNOSIS — E1122 Type 2 diabetes mellitus with diabetic chronic kidney disease: Secondary | ICD-10-CM

## 2015-04-29 DIAGNOSIS — N182 Chronic kidney disease, stage 2 (mild): Secondary | ICD-10-CM

## 2015-04-29 DIAGNOSIS — J4540 Moderate persistent asthma, uncomplicated: Secondary | ICD-10-CM

## 2015-04-29 DIAGNOSIS — S83206D Unspecified tear of unspecified meniscus, current injury, right knee, subsequent encounter: Secondary | ICD-10-CM

## 2015-04-29 DIAGNOSIS — I10 Essential (primary) hypertension: Secondary | ICD-10-CM

## 2015-04-29 LAB — GLUCOSE, CAPILLARY: Glucose-Capillary: 149 mg/dL — ABNORMAL HIGH (ref 65–99)

## 2015-04-29 LAB — POCT GLYCOSYLATED HEMOGLOBIN (HGB A1C): Hemoglobin A1C: 7.1

## 2015-04-29 MED ORDER — ALBUTEROL SULFATE HFA 108 (90 BASE) MCG/ACT IN AERS: 2.0000 | INHALATION_SPRAY | Freq: Four times a day (QID) | RESPIRATORY_TRACT | Status: DC | PRN

## 2015-04-29 MED ORDER — FLUTICASONE-SALMETEROL 250-50 MCG/DOSE IN AEPB: 1.0000 | INHALATION_SPRAY | Freq: Two times a day (BID) | RESPIRATORY_TRACT | Status: DC

## 2015-04-29 MED ORDER — ATORVASTATIN CALCIUM 40 MG PO TABS: 80.0000 mg | ORAL_TABLET | Freq: Every day | ORAL | Status: DC

## 2015-04-29 MED ORDER — METFORMIN HCL ER 500 MG PO TB24: 500.0000 mg | ORAL_TABLET | Freq: Every day | ORAL | Status: DC

## 2015-04-29 MED ORDER — PANTOPRAZOLE SODIUM 40 MG PO TBEC: 40.0000 mg | DELAYED_RELEASE_TABLET | Freq: Every day | ORAL | Status: DC

## 2015-04-29 MED ORDER — CYCLOBENZAPRINE HCL 10 MG PO TABS: 10.0000 mg | ORAL_TABLET | Freq: Three times a day (TID) | ORAL | Status: DC | PRN

## 2015-04-29 MED ORDER — LISINOPRIL 5 MG PO TABS: 5.0000 mg | ORAL_TABLET | Freq: Every day | ORAL | Status: DC

## 2015-04-29 NOTE — Assessment & Plan Note (Signed)
-   refilled atorvastatin 

## 2015-04-29 NOTE — Progress Notes (Signed)
Patient ID: Javier Beasley, male   DOB: 09-Oct-1965, 49 y.o.   MRN: [id number]     Subjective:   [name]    DOB: [birth year] 49 y.o.    MRN: [id number] Health Maintenance Due: Health Maintenance Due  Topic Date Due  . HEMOGLOBIN A1C  03/29/2015  . INFLUENZA VACCINE  04/18/2015    _________________________________________________  HPI: Javier Beasley is a 49 y.o. male here for a hospital f/u.  Pt has a PMH outlined below.  Please see problem-based charting assessment and plan for further details of medical issues addressed at today's visit.  PMH: Past Medical History  Diagnosis Date  . Asthma   . Chronic back pain   . Bilateral chronic knee pain   . Chronic shoulder pain   . Hypertension   . Reflux   . Depression   . Hx of echocardiogram     a. Echo 10/24/12:  EF 60-65%, normal wall thickness, normal diastolic fxn  . Hx of cardiovascular stress test     a. Lexiscan Myoview 10/29/12:  EF 56%, no ischemia  . Allergy   . Hyperlipidemia   . Diabetes mellitus without complication   . Renal disorder     chronic kidney disease  . History of herniated intervertebral disc     Medications: Current Outpatient Prescriptions on File Prior to Visit  Medication Sig Dispense Refill  . docusate sodium (COLACE) 100 MG capsule Take 1-2 capsules (100-200 mg total) by mouth daily as needed. 30 capsule 1  . traMADol (ULTRAM) 50 MG tablet Take 1 tablet (50 mg total) by mouth every 6 (six) hours as needed. 15 tablet 0   No current facility-administered medications on file prior to visit.    Allergies: Allergies  Allergen Reactions  . Yeast-Related Products Shortness Of Breath  . Pork-Derived Products     Does not eat pork    FH: Family History  Problem Relation Age of Onset  . Hypertension Other   . Diabetes Other   . Heart attack Other   . Diabetes Mother   . Hypertension Mother   . Hyperlipidemia Neg Hx   . Sudden death Neg Hx   . Hypertension Sister     . Stroke Sister   . Hypertension Brother   . Asthma Daughter     SH: Social History   Social History  . Marital Status: Divorced    Spouse Name: N/A  . Number of Children: N/A  . Years of Education: N/A   Social History Main Topics  . Smoking status: Never Smoker   . Smokeless tobacco: Never Used  . Alcohol Use: 1.8 oz/week    0 Standard drinks or equivalent, 3 Cans of beer per week     Comment: Beer.  . Drug Use: Yes    Special: Marijuana     Comment: daily MJ use for pain control.- denies 02/01/2015  . Sexual Activity: Not Asked   Other Topics Concern  . None   Social History Narrative   Lives in [place] with significant other.    Review of Systems: Constitutional: Negative for fever, chills and weight loss.  Eyes: Negative for blurred vision.  Respiratory: Negative for cough and shortness of breath. +wheezing Cardiovascular: Negative for chest pain, palpitations and neg leg swelling.  Gastrointestinal: Negative for nausea, vomiting, abdominal pain, diarrhea, constipation and blood in stool.  Genitourinary: Negative for dysuria, urgency and frequency.  Musculoskeletal: Negative for myalgias and back pain.  Neurological: Negative  for dizziness, weakness and headaches.     Objective:   Vital Signs: Filed Vitals:   04/29/15 1426  BP: 130/75  Pulse: 68  Temp: 98.2 F (36.8 C)  Height:  (1.854 m)  Weight: 200 lb 9.6 oz (90.992 kg)  SpO2: 98%      BP Readings from Last 3 Encounters:  04/29/15 130/75  03/16/15 147/81  02/01/15 125/80    Physical Exam: Constitutional: Vital signs reviewed.  Patient is in NAD and cooperative with exam.  Head: Normocephalic and atraumatic. Eyes: EOMI, conjunctivae nl, no scleral icterus.  Neck: Supple. Cardiovascular: RRR, no MRG. Pulmonary/Chest: normal effort, CTAB, no wheezes, rales, or rhonchi. Abdominal: Soft. NT/ND +BS. Neurological: A&O x3, cranial nerves II-XII are grossly intact, moving all  extremities. Extremities: 2+DP b/l; no pitting edema. Skin: Warm, dry and intact. No rash.   Assessment & Plan:   Assessment and plan was discussed and formulated with my attending.

## 2015-04-29 NOTE — Assessment & Plan Note (Signed)
Pt is going to [facility] for multiple issues with include a recent meniscal tear scheduled for arthroplasty.  -f/u with SM

## 2015-04-29 NOTE — Assessment & Plan Note (Signed)
Pt reports being unable to afford asthma maintenance meds.  He was on QVAR and Advair at points in time.  However, he was unable to afford these and now uses albuterol in the AM and PM daily.  No wheezing on exam.   -refilled advair at the [facility] (pt has the orange card an Advair is on their list) -advised to use advair daily and albuterol PRN

## 2015-04-29 NOTE — Assessment & Plan Note (Addendum)
BP 130/75. -refilled lisinopril 

## 2015-04-29 NOTE — Patient Instructions (Signed)
Thank you for your visit today.   Please return to the internal medicine clinic in 3 month(s) or sooner if needed.     I have made the following additions/changes to your medications: [facility]  You should take advair twice daily.  Take albuterol every 6 hrs as needed for wheezing.    Please be sure to bring all of your medications with you to every visit; this includes herbal supplements, vitamins, eye drops, and any over-the-counter medications.   Should you have any questions regarding your medications and/or any new or worsening symptoms, please be sure to call the clinic at [phone].   If you believe that you are suffering from a life threatening condition or one that may result in the loss of limb or function, then you should call 911 and proceed to the nearest Emergency Department.   A healthy lifestyle and preventative care can promote health and wellness.   Maintain regular health, dental, and eye exams.  Eat a healthy diet. Foods like vegetables, fruits, whole grains, low-fat dairy products, and lean protein foods contain the nutrients you need without too many calories. Decrease your intake of foods high in solid fats, added sugars, and salt. Get information about a proper diet from your caregiver, if necessary.  Regular physical exercise is one of the most important things you can do for your health. Most adults should get at least 150 minutes of moderate-intensity exercise (any activity that increases your heart rate and causes you to sweat) each week. In addition, most adults need muscle-strengthening exercises on 2 or more days a week.   Maintain a healthy weight. The body mass index (BMI) is a screening tool to identify possible weight problems. It provides an estimate of body fat based on height and weight. Your caregiver can help determine your BMI, and can help you achieve or maintain a healthy weight. For adults 20 years and  older:  A BMI below 18.5 is considered underweight.  A BMI of 18.5 to 24.9 is normal.  A BMI of 25 to 29.9 is considered overweight.  A BMI of 30 and above is considered obese.

## 2015-04-29 NOTE — Assessment & Plan Note (Addendum)
HA1c 7.1 today.   -can space Ha1c out to q43months -refilled metformin

## 2015-05-04 NOTE — Progress Notes (Signed)
Internal Medicine Clinic Attending  Case discussed with Dr. Gill soon after the resident saw the patient.  We reviewed the resident's history and exam and pertinent patient test results.  I agree with the assessment, diagnosis, and plan of care documented in the resident's note.  

## 2015-05-09 ENCOUNTER — Encounter (HOSPITAL_BASED_OUTPATIENT_CLINIC_OR_DEPARTMENT_OTHER): Payer: Self-pay | Admitting: *Deleted

## 2015-05-10 ENCOUNTER — Encounter (HOSPITAL_BASED_OUTPATIENT_CLINIC_OR_DEPARTMENT_OTHER)
Admission: RE | Admit: 2015-05-10 | Discharge: 2015-05-10 | Disposition: A | Discharge status: Home / Self Care | Payer: No Typology Code available for payment source | Source: Ambulatory Visit | Attending: Orthopaedic Surgery | Admitting: Orthopaedic Surgery

## 2015-05-10 DIAGNOSIS — Z01818 Encounter for other preprocedural examination: Secondary | ICD-10-CM | POA: Insufficient documentation

## 2015-05-10 DIAGNOSIS — S83206D Unspecified tear of unspecified meniscus, current injury, right knee, subsequent encounter: Secondary | ICD-10-CM | POA: Insufficient documentation

## 2015-05-10 LAB — BASIC METABOLIC PANEL
ANION GAP: 9 (ref 5–15)
BUN: 9 mg/dL (ref 6–20)
CALCIUM: 9.3 mg/dL (ref 8.9–10.3)
CHLORIDE: 103 mmol/L (ref 101–111)
CO2: 26 mmol/L (ref 22–32)
CREATININE: 1.2 mg/dL (ref 0.61–1.24)
Glucose, Bld: 146 mg/dL — ABNORMAL HIGH (ref 65–99)
POTASSIUM: 4 mmol/L (ref 3.5–5.1)
SODIUM: 138 mmol/L (ref 135–145)

## 2015-05-11 ENCOUNTER — Ambulatory Visit (HOSPITAL_BASED_OUTPATIENT_CLINIC_OR_DEPARTMENT_OTHER): Payer: No Typology Code available for payment source | Admitting: Anesthesiology

## 2015-05-11 ENCOUNTER — Encounter (HOSPITAL_BASED_OUTPATIENT_CLINIC_OR_DEPARTMENT_OTHER): Payer: Self-pay

## 2015-05-11 ENCOUNTER — Encounter (HOSPITAL_BASED_OUTPATIENT_CLINIC_OR_DEPARTMENT_OTHER)
Admission: RE | Disposition: A | Discharge status: Home / Self Care | Payer: Self-pay | Source: Ambulatory Visit | Attending: Orthopaedic Surgery

## 2015-05-11 ENCOUNTER — Ambulatory Visit (HOSPITAL_BASED_OUTPATIENT_CLINIC_OR_DEPARTMENT_OTHER)
Admission: RE | Admit: 2015-05-11 | Discharge: 2015-05-11 | Disposition: A | Discharge status: Home / Self Care | Payer: No Typology Code available for payment source | Source: Ambulatory Visit | Attending: Orthopaedic Surgery | Admitting: Orthopaedic Surgery

## 2015-05-11 DIAGNOSIS — E785 Hyperlipidemia, unspecified: Secondary | ICD-10-CM | POA: Insufficient documentation

## 2015-05-11 DIAGNOSIS — M6751 Plica syndrome, right knee: Secondary | ICD-10-CM | POA: Insufficient documentation

## 2015-05-11 DIAGNOSIS — E1122 Type 2 diabetes mellitus with diabetic chronic kidney disease: Secondary | ICD-10-CM | POA: Insufficient documentation

## 2015-05-11 DIAGNOSIS — Z79899 Other long term (current) drug therapy: Secondary | ICD-10-CM | POA: Insufficient documentation

## 2015-05-11 DIAGNOSIS — N189 Chronic kidney disease, unspecified: Secondary | ICD-10-CM | POA: Insufficient documentation

## 2015-05-11 DIAGNOSIS — M549 Dorsalgia, unspecified: Secondary | ICD-10-CM | POA: Insufficient documentation

## 2015-05-11 DIAGNOSIS — F329 Major depressive disorder, single episode, unspecified: Secondary | ICD-10-CM | POA: Insufficient documentation

## 2015-05-11 DIAGNOSIS — K219 Gastro-esophageal reflux disease without esophagitis: Secondary | ICD-10-CM | POA: Insufficient documentation

## 2015-05-11 DIAGNOSIS — G8929 Other chronic pain: Secondary | ICD-10-CM | POA: Insufficient documentation

## 2015-05-11 DIAGNOSIS — J45909 Unspecified asthma, uncomplicated: Secondary | ICD-10-CM | POA: Insufficient documentation

## 2015-05-11 DIAGNOSIS — S83281A Other tear of lateral meniscus, current injury, right knee, initial encounter: Secondary | ICD-10-CM | POA: Insufficient documentation

## 2015-05-11 DIAGNOSIS — Z888 Allergy status to other drugs, medicaments and biological substances status: Secondary | ICD-10-CM | POA: Insufficient documentation

## 2015-05-11 DIAGNOSIS — M65861 Other synovitis and tenosynovitis, right lower leg: Secondary | ICD-10-CM | POA: Insufficient documentation

## 2015-05-11 DIAGNOSIS — X58XXXA Exposure to other specified factors, initial encounter: Secondary | ICD-10-CM | POA: Insufficient documentation

## 2015-05-11 DIAGNOSIS — I129 Hypertensive chronic kidney disease with stage 1 through stage 4 chronic kidney disease, or unspecified chronic kidney disease: Secondary | ICD-10-CM | POA: Insufficient documentation

## 2015-05-11 DIAGNOSIS — M25519 Pain in unspecified shoulder: Secondary | ICD-10-CM | POA: Insufficient documentation

## 2015-05-11 HISTORY — PX: SYNOVECTOMY: SHX5180

## 2015-05-11 HISTORY — PX: KNEE ARTHROSCOPY WITH EXCISION PLICA: SHX5647

## 2015-05-11 HISTORY — DX: Gastro-esophageal reflux disease without esophagitis: K21.9

## 2015-05-11 HISTORY — PX: KNEE ARTHROSCOPY WITH LATERAL MENISECTOMY: SHX6193

## 2015-05-11 LAB — GLUCOSE, CAPILLARY
Glucose-Capillary: 118 mg/dL — ABNORMAL HIGH (ref 65–99)
Glucose-Capillary: 121 mg/dL — ABNORMAL HIGH (ref 65–99)

## 2015-05-11 LAB — POCT HEMOGLOBIN-HEMACUE: HEMOGLOBIN: 14.1 g/dL (ref 13.0–17.0)

## 2015-05-11 SURGERY — ARTHROSCOPY, KNEE, WITH PLICA EXCISION
Anesthesia: General | Site: Knee | Laterality: Right

## 2015-05-11 MED ORDER — SCOPOLAMINE 1 MG/3DAYS TD PT72: 1.0000 | MEDICATED_PATCH | Freq: Once | TRANSDERMAL | Status: DC | PRN

## 2015-05-11 MED ORDER — SODIUM CHLORIDE 0.9 % IR SOLN
Status: DC | PRN
Start: 2015-05-11 — End: 2015-05-11
  Administered 2015-05-11: 6000 mL

## 2015-05-11 MED ORDER — CEFAZOLIN SODIUM-DEXTROSE 2-3 GM-% IV SOLR
2.0000 g | INTRAVENOUS | Status: AC
  Administered 2015-05-11: 2 g via INTRAVENOUS

## 2015-05-11 MED ORDER — ONDANSETRON HCL 4 MG/2ML IJ SOLN
INTRAMUSCULAR | Status: DC | PRN
  Administered 2015-05-11: 4 mg via INTRAVENOUS

## 2015-05-11 MED ORDER — BUPIVACAINE HCL 0.25 % IJ SOLN
INTRAMUSCULAR | Status: DC | PRN
  Administered 2015-05-11: 20 mL

## 2015-05-11 MED ORDER — DEXAMETHASONE SODIUM PHOSPHATE 4 MG/ML IJ SOLN
INTRAMUSCULAR | Status: DC | PRN
  Administered 2015-05-11: 10 mg via INTRAVENOUS

## 2015-05-11 MED ORDER — CEFAZOLIN SODIUM-DEXTROSE 2-3 GM-% IV SOLR
INTRAVENOUS | Status: AC
  Filled 2015-05-11: qty 50

## 2015-05-11 MED ORDER — LACTATED RINGERS IV SOLN
INTRAVENOUS | Status: DC
  Administered 2015-05-11: 08:00:00 via INTRAVENOUS

## 2015-05-11 MED ORDER — PROPOFOL 10 MG/ML IV BOLUS
INTRAVENOUS | Status: DC | PRN
  Administered 2015-05-11: 30 mg via INTRAVENOUS
  Administered 2015-05-11: 200 mg via INTRAVENOUS

## 2015-05-11 MED ORDER — HYDROCODONE-ACETAMINOPHEN 5-325 MG PO TABS: 1.0000 | ORAL_TABLET | Freq: Four times a day (QID) | ORAL | Status: DC | PRN

## 2015-05-11 MED ORDER — MIDAZOLAM HCL 2 MG/2ML IJ SOLN
INTRAMUSCULAR | Status: AC
  Filled 2015-05-11: qty 2

## 2015-05-11 MED ORDER — FENTANYL CITRATE (PF) 100 MCG/2ML IJ SOLN
50.0000 ug | INTRAMUSCULAR | Status: AC | PRN
  Administered 2015-05-11: 100 ug via INTRAVENOUS
  Administered 2015-05-11 (×2): 25 ug via INTRAVENOUS
  Administered 2015-05-11: 50 ug via INTRAVENOUS

## 2015-05-11 MED ORDER — BUPIVACAINE HCL (PF) 0.25 % IJ SOLN
INTRAMUSCULAR | Status: AC
  Filled 2015-05-11: qty 60

## 2015-05-11 MED ORDER — HYDROMORPHONE HCL 1 MG/ML IJ SOLN: 0.2500 mg | INTRAMUSCULAR | Status: DC | PRN

## 2015-05-11 MED ORDER — GLYCOPYRROLATE 0.2 MG/ML IJ SOLN: 0.2000 mg | Freq: Once | INTRAMUSCULAR | Status: DC | PRN

## 2015-05-11 MED ORDER — LIDOCAINE HCL (CARDIAC) 20 MG/ML IV SOLN
INTRAVENOUS | Status: DC | PRN
  Administered 2015-05-11: 100 mg via INTRAVENOUS

## 2015-05-11 MED ORDER — PROMETHAZINE HCL 25 MG/ML IJ SOLN: 6.2500 mg | INTRAMUSCULAR | Status: DC | PRN

## 2015-05-11 MED ORDER — MIDAZOLAM HCL 2 MG/2ML IJ SOLN
1.0000 mg | INTRAMUSCULAR | Status: DC | PRN
  Administered 2015-05-11: 2 mg via INTRAVENOUS

## 2015-05-11 MED ORDER — FENTANYL CITRATE (PF) 100 MCG/2ML IJ SOLN
INTRAMUSCULAR | Status: AC
  Filled 2015-05-11: qty 6

## 2015-05-11 SURGICAL SUPPLY — 39 items
BANDAGE ELASTIC 6 VELCRO ST LF (GAUZE/BANDAGES/DRESSINGS) ×10 IMPLANT
BANDAGE ESMARK 6X9 LF (GAUZE/BANDAGES/DRESSINGS) IMPLANT
BLADE 4.2CUDA (BLADE) ×5 IMPLANT
BLADE CUDA GRT WHITE 3.5 (BLADE) IMPLANT
BLADE CUDA SHAVER 3.5 (BLADE) IMPLANT
BLADE CUTTER GATOR 3.5 (BLADE) IMPLANT
BNDG ESMARK 6X9 LF (GAUZE/BANDAGES/DRESSINGS)
CUFF TOURNIQUET SINGLE 34IN LL (TOURNIQUET CUFF) ×5 IMPLANT
DRAPE ARTHROSCOPY W/POUCH 90 (DRAPES) ×5 IMPLANT
DRAPE SURG 17X23 STRL (DRAPES) ×10 IMPLANT
DURAPREP 26ML APPLICATOR (WOUND CARE) ×5 IMPLANT
ELECT MENISCUS 165MM 90D (ELECTRODE) IMPLANT
ELECT REM PT RETURN 9FT ADLT (ELECTROSURGICAL)
ELECTRODE REM PT RTRN 9FT ADLT (ELECTROSURGICAL) IMPLANT
GAUZE SPONGE 4X4 12PLY STRL (GAUZE/BANDAGES/DRESSINGS) ×5 IMPLANT
GAUZE XEROFORM 1X8 LF (GAUZE/BANDAGES/DRESSINGS) ×5 IMPLANT
GLOVE BIOGEL PI IND STRL 7.0 (GLOVE) ×3 IMPLANT
GLOVE BIOGEL PI INDICATOR 7.0 (GLOVE) ×2
GLOVE ECLIPSE 6.5 STRL STRAW (GLOVE) ×5 IMPLANT
GLOVE NEODERM STRL 7.5 LF PF (GLOVE) ×3 IMPLANT
GLOVE SURG NEODERM 7.5  LF PF (GLOVE) ×2
GLOVE SURG SYN 7.5  E (GLOVE) ×2
GLOVE SURG SYN 7.5 E (GLOVE) ×3 IMPLANT
GOWN STRL REIN XL XLG (GOWN DISPOSABLE) ×5 IMPLANT
GOWN STRL REUS W/ TWL LRG LVL3 (GOWN DISPOSABLE) ×3 IMPLANT
GOWN STRL REUS W/TWL LRG LVL3 (GOWN DISPOSABLE) ×2
KNEE WRAP E Z 3 GEL PACK (MISCELLANEOUS) ×5 IMPLANT
MANIFOLD NEPTUNE II (INSTRUMENTS) ×5 IMPLANT
PACK ARTHROSCOPY DSU (CUSTOM PROCEDURE TRAY) ×5 IMPLANT
PACK BASIN DAY SURGERY FS (CUSTOM PROCEDURE TRAY) ×5 IMPLANT
PENCIL BUTTON HOLSTER BLD 10FT (ELECTRODE) IMPLANT
RESECTOR FULL RADIUS 4.2MM (BLADE) IMPLANT
SET ARTHROSCOPY TUBING (MISCELLANEOUS) ×2
SET ARTHROSCOPY TUBING LN (MISCELLANEOUS) ×3 IMPLANT
SLEEVE SCD COMPRESS KNEE MED (MISCELLANEOUS) ×5 IMPLANT
SUT ETHILON 3 0 PS 1 (SUTURE) ×5 IMPLANT
TOWEL OR 17X24 6PK STRL BLUE (TOWEL DISPOSABLE) ×5 IMPLANT
TOWEL OR NON WOVEN STRL DISP B (DISPOSABLE) ×5 IMPLANT
WATER STERILE IRR 1000ML POUR (IV SOLUTION) ×5 IMPLANT

## 2015-05-11 NOTE — Anesthesia Preprocedure Evaluation (Signed)
Anesthesia Evaluation  Patient identified by MRN, date of birth, ID band Patient awake    Airway Mallampati: I  TM Distance: >3 FB Neck ROM: Full    Dental   Pulmonary  breath sounds clear to auscultation        Cardiovascular hypertension, Rhythm:Regular Rate:Normal     Neuro/Psych    GI/Hepatic GERD-  ,  Endo/Other  diabetes  Renal/GU Renal InsufficiencyRenal disease     Musculoskeletal   Abdominal   Peds  Hematology   Anesthesia Other Findings   Reproductive/Obstetrics                             Anesthesia Physical Anesthesia Plan  ASA: III  Anesthesia Plan: General   Post-op Pain Management:    Induction: Intravenous  Airway Management Planned: LMA  Additional Equipment:   Intra-op Plan:   Post-operative Plan: Extubation in OR  Informed Consent: I have reviewed the patients History and Physical, chart, labs and discussed the procedure including the risks, benefits and alternatives for the proposed anesthesia with the patient or authorized representative who has indicated his/her understanding and acceptance.   Dental advisory given  Plan Discussed with: CRNA and Surgeon  Anesthesia Plan Comments:         Anesthesia Quick Evaluation

## 2015-05-11 NOTE — Anesthesia Postprocedure Evaluation (Signed)
  Anesthesia Post-op Note  Patient: Javier Beasley  Procedure(s) Performed: Procedure(s): RIGHT KNEE ARTHROSCOPY WITH PARTIAL LATERAL MENISCECTOMY (Right) KNEE ARTHROSCOPY WITH EXCISION PLICA SYNOVECTOMY (Right)  Patient Location: PACU  Anesthesia Type:General  Level of Consciousness: awake and alert   Airway and Oxygen Therapy: Patient Spontanous Breathing  Post-op Pain: mild  Post-op Assessment: Post-op Vital signs reviewed     RLE Motor Response: Purposeful movement RLE Sensation: Numbness      Post-op Vital Signs: stable  Last Vitals:  Filed Vitals:   05/11/15 1000  BP: 127/84  Pulse: 70  Temp:   Resp: 12    Complications: No apparent anesthesia complications

## 2015-05-11 NOTE — Op Note (Signed)
   Surgery Date: 05/11/2015  Surgeon(s): Shalamar Crays Donnelly Stager, MD  ANESTHESIA:  general  FLUIDS: Per anesthesia record.   ESTIMATED BLOOD LOSS: minimal  PREOPERATIVE DIAGNOSES:  1. Right knee lateral meniscus tear 2. Right knee synovitis 3. Right knee medial plica  POSTOPERATIVE DIAGNOSES:  same  PROCEDURES PERFORMED:  1. Right knee arthroscopy with extensive synovectomy 2. Right knee arthroscopy with arthroscopic partial lateral meniscectomy 3. Right knee arthroscopy with arthroscopic chondroplasty lateral femoral condyle and lateral tibial plateau.  DESCRIPTION OF PROCEDURE: Javier Beasley is a 49 y.o.-year-old male with Right knee lateral meniscus tear. Plans are to proceed with partial lateral meniscectomy and diagnostic arthroscopy with debridement as indicated. Full discussion held regarding risks benefits alternatives and complications related surgical intervention. Conservative care options reviewed. All questions answered.  The patient was identified in the preoperative holding area and the operative extremity was marked. The patient was brought to the operating room and transferred to operating table in a supine position. Satisfactory general anesthesia was induced by Anesthesiology.   Standard anterolateral, anteromedial arthroscopy portals were obtained. The anteromedial portal was obtained with a spinal needle for localization under direct visualization with subsequent diagnostic findings.   Anteromedial and anterolateral chambers: moderate synovitis. The synovitis was debrided with a 4.5 mm full radius shaver through both the anteromedial and lateral portals.   Suprapatellar pouch and gutters: moderate synovitis or debris. Patella chondral surface: Grade 1 Trochlear chondral surface: Grade 1 Patellofemoral tracking: normal Medial meniscus: normal.  Medial femoral condyle flexion bearing surface: Grade 0 Medial femoral condyle extension bearing surface: Grade 0 Medial tibial  plateau: Grade 0 Anterior cruciate ligament:stable Posterior cruciate ligament:stable Lateral meniscus: radial tear of mid body.   Lateral femoral condyle flexion bearing surface: Grade 1 Lateral femoral condyle extension bearing surface: Grade 1 Lateral tibial plateau: Grade 1  After completion of synovectomy, diagnostic exam, and debridements as described, all compartments were checked and no residual debris remained. Hemostasis was achieved with the cautery wand. The portals were approximated with interrupted nylon suture. All excess fluid was expressed from the joint. The portals were approximated with interrupted nylon suture. Xeroform sterile gauze dressings were applied followed by Ace bandage and ice pack.   DISPOSITION: The patient was awakened from general anesthetic, extubated, taken to the recovery room in medically stable condition, no apparent complications. The patient may be weightbearing as tolerated to the right lower extremity.  Range of motion of right knee as tolerated.  Mayra Reel, MD [facility] [phone] 9:24 AM

## 2015-05-11 NOTE — H&P (Signed)
PREOPERATIVE H&P  Chief Complaint: Right knee lateral meniscal tear  HPI: Javier Beasley is a 49 y.o. male who presents for surgical treatment of Right knee lateral meniscal tear.  He denies any changes in medical history.  Past Medical History  Diagnosis Date  . Asthma   . Chronic back pain   . Bilateral chronic knee pain   . Chronic shoulder pain   . Hypertension   . Reflux   . Depression   . Hx of echocardiogram     a. Echo 10/24/12:  EF 60-65%, normal wall thickness, normal diastolic fxn  . Hx of cardiovascular stress test     a. Lexiscan Myoview 10/29/12:  EF 56%, no ischemia  . Allergy   . Hyperlipidemia   . Diabetes mellitus without complication   . Renal disorder     chronic kidney disease  . History of herniated intervertebral disc   . GERD (gastroesophageal reflux disease)    Past Surgical History  Procedure Laterality Date  . Esophagogastroduodenoscopy Left 10/24/2012    Procedure: ESOPHAGOGASTRODUODENOSCOPY (EGD);  Surgeon: Willis Modena, MD;  Location: [facility] ENDOSCOPY;  Service: Endoscopy;  Laterality: Left;  possible balloon dilation  . Balloon dilation N/A 10/24/2012    Procedure: BALLOON DILATION;  Surgeon: Willis Modena, MD;  Location: [facility] ENDOSCOPY;  Service: Endoscopy;  Laterality: N/A;   Social History   Social History  . Marital Status: Divorced    Spouse Name: N/A  . Number of Children: N/A  . Years of Education: N/A   Social History Main Topics  . Smoking status: Never Smoker   . Smokeless tobacco: Never Used  . Alcohol Use: 1.8 oz/week    3 Cans of beer, 0 Standard drinks or equivalent per week     Comment: Beer.  . Drug Use: Yes    Special: Marijuana     Comment: daily MJ use for pain control.- smoked 1 mo ago  . Sexual Activity: Not Asked   Other Topics Concern  . None   Social History Narrative   Lives in [place] with significant other.   Family History  Problem Relation Age of Onset  . Hypertension Other   . Diabetes Other   . Heart  attack Other   . Diabetes Mother   . Hypertension Mother   . Hyperlipidemia Neg Hx   . Sudden death Neg Hx   . Hypertension Sister   . Stroke Sister   . Hypertension Brother   . Asthma Daughter    Allergies  Allergen Reactions  . Yeast-Related Products Shortness Of Breath  . Pork-Derived Products     Does not eat pork   Prior to Admission medications   Medication Sig Start Date End Date Taking? Authorizing Keirstyn Aydt  albuterol (PROVENTIL HFA;VENTOLIN HFA) 108 (90 BASE) MCG/ACT inhaler Inhale 2 puffs into the lungs every 6 (six) hours as needed for wheezing or shortness of breath. 04/29/15  Yes Marrian Salvage, MD  atorvastatin (LIPITOR) 40 MG tablet Take 2 tablets (80 mg total) by mouth daily at 6 PM. 04/29/15  Yes Marrian Salvage, MD  cyclobenzaprine (FLEXERIL) 10 MG tablet Take 1 tablet (10 mg total) by mouth 3 (three) times daily as needed for muscle spasms. 04/29/15 04/28/16 Yes Marrian Salvage, MD  docusate sodium (COLACE) 100 MG capsule Take 1-2 capsules (100-200 mg total) by mouth daily as needed. 12/28/14 12/28/15 Yes Gust Rung, DO  Fluticasone-Salmeterol (ADVAIR DISKUS) 250-50 MCG/DOSE AEPB Inhale 1 puff into the lungs 2 (  two) times daily. 04/29/15 04/28/16 Yes Marrian Salvage, MD  lisinopril (PRINIVIL,ZESTRIL) 5 MG tablet Take 1 tablet (5 mg total) by mouth daily. 04/29/15 04/28/16 Yes Marrian Salvage, MD  metFORMIN (GLUCOPHAGE XR) 500 MG 24 hr tablet Take 1 tablet (500 mg total) by mouth daily with breakfast. 04/29/15 04/28/16 Yes Marrian Salvage, MD  pantoprazole (PROTONIX) 40 MG tablet Take 1 tablet (40 mg total) by mouth daily. 04/29/15  Yes Marrian Salvage, MD  traMADol (ULTRAM) 50 MG tablet Take 1 tablet (50 mg total) by mouth every 6 (six) hours as needed. 02/01/15  Yes Gerhard Munch, MD     Positive ROS: All other systems have been reviewed and were otherwise negative with the exception of those mentioned in the HPI and as above.  Physical Exam: General: Alert, no  acute distress Cardiovascular: No pedal edema Respiratory: No cyanosis, no use of accessory musculature GI: abdomen soft Skin: No lesions in the area of chief complaint Neurologic: Sensation intact distally Psychiatric: Patient is competent for consent with normal mood and affect Lymphatic: no lymphedema  MUSCULOSKELETAL: exam stable  Assessment: Right knee lateral meniscal tear  Plan: Plan for Procedure(s): RIGHT KNEE ARTHROSCOPY WITH PARTIAL LATERAL MENISCECTOMY  The risks benefits and alternatives were discussed with the patient including but not limited to the risks of nonoperative treatment, versus surgical intervention including infection, bleeding, nerve injury,  blood clots, cardiopulmonary complications, morbidity, mortality, among others, and they were willing to proceed.   Cheral Almas, MD   05/11/2015 8:22 AM

## 2015-05-11 NOTE — Discharge Instructions (Signed)
1. Remove bandage in 2 days. °2. Place bandaids on the incisions. °3. May shower on postoperative day 2. °4. Slowly increase activity as tolerated °5. Take pain medicines and stool softeners as needed. °6. Ice the knee around the clock. ° ° ° ° °Post Anesthesia Home Care Instructions ° °Activity: °Get plenty of rest for the remainder of the day. A responsible adult should stay with you for 24 hours following the procedure.  °For the next 24 hours, DO NOT: °-Drive a car °-Operate machinery °-Drink alcoholic beverages °-Take any medication unless instructed by your physician °-Make any legal decisions or sign important papers. ° °Meals: °Start with liquid foods such as gelatin or soup. Progress to regular foods as tolerated. Avoid greasy, spicy, heavy foods. If nausea and/or vomiting occur, drink only clear liquids until the nausea and/or vomiting subsides. Call your physician if vomiting continues. ° °Special Instructions/Symptoms: °Your throat may feel dry or sore from the anesthesia or the breathing tube placed in your throat during surgery. If this causes discomfort, gargle with warm salt water. The discomfort should disappear within 24 hours. ° °If you had a scopolamine patch placed behind your ear for the management of post- operative nausea and/or vomiting: ° °1. The medication in the patch is effective for 72 hours, after which it should be removed.  Wrap patch in a tissue and discard in the trash. Wash hands thoroughly with soap and water. °2. You may remove the patch earlier than 72 hours if you experience unpleasant side effects which may include dry mouth, dizziness or visual disturbances. °3. Avoid touching the patch. Wash your hands with soap and water after contact with the patch. °  ° °

## 2015-05-11 NOTE — Anesthesia Procedure Notes (Signed)
Procedure Name: LMA Insertion Date/Time: 05/11/2015 8:40 AM Performed by: Gar Gibbon Pre-anesthesia Checklist: Patient identified, Emergency Drugs available, Suction available and Patient being monitored Patient Re-evaluated:Patient Re-evaluated prior to inductionOxygen Delivery Method: Circle System Utilized Preoxygenation: Pre-oxygenation with 100% oxygen Intubation Type: IV induction Ventilation: Mask ventilation without difficulty LMA: LMA inserted LMA Size: 5.0 Number of attempts: 1 Airway Equipment and Method: Bite block Placement Confirmation: positive ETCO2 Tube secured with: Tape Dental Injury: Teeth and Oropharynx as per pre-operative assessment

## 2015-05-11 NOTE — Transfer of Care (Signed)
Immediate Anesthesia Transfer of Care Note  Patient: Javier Beasley  Procedure(s) Performed: Procedure(s): RIGHT KNEE ARTHROSCOPY WITH PARTIAL LATERAL MENISCECTOMY (Right) KNEE ARTHROSCOPY WITH EXCISION PLICA SYNOVECTOMY (Right)  Patient Location: PACU  Anesthesia Type:General  Level of Consciousness: sedated and lethargic  Airway & Oxygen Therapy: Patient Spontanous Breathing and Patient connected to face mask oxygen  Post-op Assessment: Report given to RN and Post -op Vital signs reviewed and stable  Post vital signs: Reviewed and stable  Last Vitals:  Filed Vitals:   05/11/15 0744  BP: 129/72  Pulse: 66  Temp: 36.7 C  Resp: 20    Complications: No apparent anesthesia complications

## 2015-05-12 ENCOUNTER — Encounter (HOSPITAL_BASED_OUTPATIENT_CLINIC_OR_DEPARTMENT_OTHER): Payer: Self-pay | Admitting: Orthopaedic Surgery

## 2015-05-13 NOTE — Addendum Note (Signed)
Addendum  created 05/13/15 0800 by Lance Coon, CRNA   Modules edited: Charges VN

## 2015-05-25 ENCOUNTER — Encounter (HOSPITAL_BASED_OUTPATIENT_CLINIC_OR_DEPARTMENT_OTHER): Payer: Self-pay | Admitting: Orthopaedic Surgery

## 2015-06-13 ENCOUNTER — Encounter: Payer: Self-pay | Admitting: Sports Medicine

## 2015-06-13 ENCOUNTER — Ambulatory Visit (INDEPENDENT_AMBULATORY_CARE_PROVIDER_SITE_OTHER): Payer: No Typology Code available for payment source | Admitting: Sports Medicine

## 2015-06-13 VITALS — BP 127/86 | HR 82 | Ht 73.0 in | Wt 205.0 lb

## 2015-06-13 DIAGNOSIS — M25561 Pain in right knee: Secondary | ICD-10-CM

## 2015-06-13 DIAGNOSIS — M79672 Pain in left foot: Secondary | ICD-10-CM

## 2015-06-13 DIAGNOSIS — M5010 Cervical disc disorder with radiculopathy, unspecified cervical region: Secondary | ICD-10-CM

## 2015-06-13 MED ORDER — PREDNISONE 10 MG PO TABS: ORAL_TABLET | ORAL | Status: DC

## 2015-06-13 NOTE — Progress Notes (Signed)
   Subjective:    Patient ID: Javier Beasley, male    DOB: [birth year], 49 y.o.   MRN: [id number]  HPI chief complaint: Right arm pain, right knee pain, left foot pain  Patient comes in today with several different complaints. Primary complaint is returning pain in his right arm. He has a documented history of cervical radiculopathy which has responded in the past to cervical ESI's. His pain has returned. Begins in the right shoulder and will radiate down the right arm into his thumb and index finger. He has associated numbness and tingling as well. Also complaining of right knee pain. He is status post right knee lateral meniscectomy, synovectomy, and chondroplasty done by Dr.Xu last month. He still getting occasional pain and catching in the knee. Some feelings of instability as well. His final concern is his left foot. He has pain along the dorsum of the foot. Dr.Xu  discussed getting x-rays of his left foot but this was never done. No swelling. No numbness or tingling. No trauma. Pain is worse first thing in the morning and at the end of the day. He denies heel pain.    Review of Systems     Objective:   Physical Exam Well-developed, well-nourished. No acute distress. Sitting comfortable in the exam room  Cervical spine: Good cervical range of motion. Positive Spurling's to the right.  Neurological exam: Good strength in both upper extremities. Reflexes are equal at the biceps, triceps, and brachial radialis tendons. Sensation is decreased along the C6 dermatome in the right hand.  Right knee: Full range of motion. No effusion. Good stability. No warmth. Normal color. There is obvious quad atrophy with the right quadriceps measuring a full inch less in diameter than the left thigh. Neurovascularly intact.  Left foot: No swelling. Slight tenderness to palpation along the dorsum lateral aspect of the foot. No tenderness at the calcaneal insertion of the plantar fascia. Pes planus with standing.  Neurovascular intact distally. Walking without significant limp.  An MRI of his cervical spine done in decerebrate 2015 showed moderate bilateral foraminal narrowing at C5-C6 and C6-C7.       Assessment & Plan:  Right shoulder and arm pain secondary to cervical radiculopathy Status post right knee arthroscopy for partial lateral meniscectomy, synovectomy, and chondroplasty Pes planus  Given his diffuse complaints we will try a 6 day Sterapred Dosepak. I did discuss repeat cervical ESI's given his positive response to these in the past. I also discussed the possibility of neurosurgical referral but his lack of insurance may make this challenging. In regards to his persistent right knee pain after surgery I've explained to him that he has significant work to do in order to regain strength in his quadriceps. He has been instructed in a home exercise program and I've highly encouraged him to do this. He has minimal arthritic changes according to the operative note. For his left foot I will get a simple x-ray and we will try and arch strap. Patient will follow-up with me in 2 weeks.

## 2015-06-16 ENCOUNTER — Ambulatory Visit: Payer: No Typology Code available for payment source | Admitting: Sports Medicine

## 2015-06-21 ENCOUNTER — Ambulatory Visit: Payer: No Typology Code available for payment source

## 2015-06-27 ENCOUNTER — Ambulatory Visit: Payer: Self-pay

## 2015-06-27 ENCOUNTER — Encounter: Payer: Self-pay | Admitting: Sports Medicine

## 2015-06-27 ENCOUNTER — Ambulatory Visit (INDEPENDENT_AMBULATORY_CARE_PROVIDER_SITE_OTHER): Payer: Self-pay | Admitting: Sports Medicine

## 2015-06-27 ENCOUNTER — Ambulatory Visit (HOSPITAL_COMMUNITY)
Admission: RE | Admit: 2015-06-27 | Discharge: 2015-06-27 | Disposition: A | Discharge status: Home / Self Care | Payer: Self-pay | Source: Ambulatory Visit | Attending: Sports Medicine | Admitting: Sports Medicine

## 2015-06-27 VITALS — BP 128/87 | HR 65 | Ht 72.0 in | Wt 205.0 lb

## 2015-06-27 DIAGNOSIS — M79672 Pain in left foot: Secondary | ICD-10-CM | POA: Insufficient documentation

## 2015-06-27 DIAGNOSIS — M25511 Pain in right shoulder: Secondary | ICD-10-CM

## 2015-06-27 DIAGNOSIS — M5010 Cervical disc disorder with radiculopathy, unspecified cervical region: Secondary | ICD-10-CM

## 2015-06-27 NOTE — Progress Notes (Signed)
   Subjective:    Patient ID: Javier Beasley, male    DOB: 1966/06/07, 49 y.o.   MRN: [id number]  HPI  Patient comes in today for follow-up on left foot pain. X-rays are unremarkable. His prednisone dose pack did help somewhat. He has been using his arch strap. He is complaining of returning right arm pain and numbness. He has a documented history of cervical degenerative disc disease resulting in cervical radiculopathy in the right arm. He experiences positive yet temporary symptom relief with cervical ESI's. He would like to go ahead with a repeat injection. He is also optimistic that he will have his disability by the end of the month.    Review of Systems     Objective:   Physical Exam Well-developed, well-nourished. No acute distress. Awake alert and oriented 3.  Left foot: Slight tenderness to palpation at the calcaneal origin the plantar fascia. No other bony or soft tissue tenderness to palpation. Soft tissue swelling. Neurovascularly intact distally. Pes planus with standing.  Right arm: No atrophy. Reflexes are trace but equal at the biceps, triceps, and brachial radialis tendons bilaterally. Decreased sensation to light touch along the C6 dermatome in the wrist and hand. Good grip strength. Good radial and ulnar pulses.  X-rays of the left foot are as above. They're dated 06/27/2015.       Assessment & Plan:  Left foot pain secondary to plantar fasciitis Cervical radiculopathy right arm  Reassurance regarding his left foot x-rays. Continue with the arch strap as needed. I will give him some green sports insoles as well. For his returning cervical radiculopathy of his right arm I will send him back over to [facility] imaging for a repeat cervical ESI. If symptoms persist thereafter, and patient is able to get insurance through his disability, then I would consider referral to one of the neurosurgeons for their input. Patient will let me know how he responds to his injection.  Follow-up in the office when necessary.

## 2015-07-11 ENCOUNTER — Other Ambulatory Visit: Payer: Self-pay | Admitting: Sports Medicine

## 2015-07-11 DIAGNOSIS — M5412 Radiculopathy, cervical region: Secondary | ICD-10-CM

## 2015-07-19 ENCOUNTER — Ambulatory Visit
Admission: RE | Admit: 2015-07-19 | Discharge: 2015-07-19 | Disposition: A | Discharge status: Home / Self Care | Payer: No Typology Code available for payment source | Source: Ambulatory Visit | Attending: Sports Medicine | Admitting: Sports Medicine

## 2015-07-19 DIAGNOSIS — M5412 Radiculopathy, cervical region: Secondary | ICD-10-CM

## 2015-07-19 MED ORDER — IOHEXOL 300 MG/ML  SOLN
1.0000 mL | Freq: Once | INTRAMUSCULAR | Status: DC | PRN
  Administered 2015-07-19: 1 mL via EPIDURAL

## 2015-07-19 MED ORDER — TRIAMCINOLONE ACETONIDE 40 MG/ML IJ SUSP (RADIOLOGY)
60.0000 mg | Freq: Once | INTRAMUSCULAR | Status: AC
  Administered 2015-07-19: 60 mg via EPIDURAL

## 2015-08-04 ENCOUNTER — Ambulatory Visit (INDEPENDENT_AMBULATORY_CARE_PROVIDER_SITE_OTHER): Payer: No Typology Code available for payment source | Admitting: Pulmonary Disease

## 2015-08-04 ENCOUNTER — Encounter: Payer: Self-pay | Admitting: Pulmonary Disease

## 2015-08-04 VITALS — BP 134/80 | HR 91 | Temp 98.4°F | Ht 73.0 in | Wt 200.5 lb

## 2015-08-04 DIAGNOSIS — E1122 Type 2 diabetes mellitus with diabetic chronic kidney disease: Secondary | ICD-10-CM

## 2015-08-04 DIAGNOSIS — J4540 Moderate persistent asthma, uncomplicated: Secondary | ICD-10-CM

## 2015-08-04 DIAGNOSIS — R42 Dizziness and giddiness: Secondary | ICD-10-CM

## 2015-08-04 DIAGNOSIS — N182 Chronic kidney disease, stage 2 (mild): Secondary | ICD-10-CM

## 2015-08-04 DIAGNOSIS — I10 Essential (primary) hypertension: Secondary | ICD-10-CM

## 2015-08-04 LAB — POCT GLYCOSYLATED HEMOGLOBIN (HGB A1C): HEMOGLOBIN A1C: 7.2

## 2015-08-04 LAB — GLUCOSE, CAPILLARY: Glucose-Capillary: 152 mg/dL — ABNORMAL HIGH (ref 65–99)

## 2015-08-04 NOTE — Progress Notes (Signed)
Subjective:    Patient ID: Javier Beasley, male    DOB: [birth year], 49 y.o.   MRN: [id number]  HPI Javier Beasley is a 49 year old man with history of asthma, HTN, HLD, chronic knee/shoulder/back pain presenting for follow up of his asthma.  He is doing well. He has follow up with orthopedics and sports medicine for his chronic knee/shoulder/back pain.  He has been unable to fill his Advair because his pharmacy does not have it in stock.  He reports occasional dizziness when he is trying to exercise. Denies LOC. He feels tremulous when it happens with some chest discomfort. Denies dyspnea. He has some cold intolerance too.  Review of Systems Constitutional: no fevers/chills Eyes: no vision changes Ears, nose, mouth, throat, and face: no cough Respiratory: no shortness of breath Cardiovascular: no chest pain Gastrointestinal: no nausea/vomiting, no abdominal pain, no constipation, no diarrhea Genitourinary: no dysuria, no hematuria Integument: no rash Hematologic/lymphatic: no bleeding/bruising, no edema Musculoskeletal: no arthralgias, no myalgias Neurological: no paresthesias, no weakness  Past Medical History  Diagnosis Date  . Asthma   . Chronic back pain   . Bilateral chronic knee pain   . Chronic shoulder pain   . Hypertension   . Reflux   . Depression   . Hx of echocardiogram     a. Echo 10/24/12:  EF 60-65%, normal wall thickness, normal diastolic fxn  . Hx of cardiovascular stress test     a. Lexiscan Myoview 10/29/12:  EF 56%, no ischemia  . Allergy   . Hyperlipidemia   . Diabetes mellitus without complication (HCC)   . Renal disorder     chronic kidney disease  . History of herniated intervertebral disc   . GERD (gastroesophageal reflux disease)     Current Outpatient Prescriptions on File Prior to Visit  Medication Sig Dispense Refill  . albuterol (PROVENTIL HFA;VENTOLIN HFA) 108 (90 BASE) MCG/ACT inhaler Inhale 2 puffs into the lungs every 6 (six) hours  as needed for wheezing or shortness of breath. 18 g 3  . atorvastatin (LIPITOR) 40 MG tablet Take 2 tablets (80 mg total) by mouth daily at 6 PM. 60 tablet 3  . cyclobenzaprine (FLEXERIL) 10 MG tablet Take 1 tablet (10 mg total) by mouth 3 (three) times daily as needed for muscle spasms. 60 tablet 0  . docusate sodium (COLACE) 100 MG capsule Take 1-2 capsules (100-200 mg total) by mouth daily as needed. 30 capsule 1  . Fluticasone-Salmeterol (ADVAIR DISKUS) 250-50 MCG/DOSE AEPB Inhale 1 puff into the lungs 2 (two) times daily. 1 each 6  . HYDROcodone-acetaminophen (NORCO) 5-325 MG per tablet Take 1-2 tablets by mouth every 6 (six) hours as needed. 90 tablet 0  . lisinopril (PRINIVIL,ZESTRIL) 5 MG tablet Take 1 tablet (5 mg total) by mouth daily. 90 tablet 3  . metFORMIN (GLUCOPHAGE XR) 500 MG 24 hr tablet Take 1 tablet (500 mg total) by mouth daily with breakfast. 90 tablet 3  . pantoprazole (PROTONIX) 40 MG tablet Take 1 tablet (40 mg total) by mouth daily. 30 tablet 1  . predniSONE (DELTASONE) 10 MG tablet Follow directions as given 21 tablet 0  . traMADol (ULTRAM) 50 MG tablet Take 1 tablet (50 mg total) by mouth every 6 (six) hours as needed. 15 tablet 0   No current facility-administered medications on file prior to visit.    Today's Vitals   08/04/15 1549  BP: 134/80  Pulse: 91  Temp: 98.4 F (36.9 C)  Height: 6'  1" (1.854 m)  Weight: 200 lb 8 oz (90.946 kg)  SpO2: 99%  PainSc: 0-No pain   Objective:  Physical Exam  Constitutional: He is oriented to person, place, and time. He appears well-developed and well-nourished. No distress.  HENT:  Head: Normocephalic and atraumatic.  Eyes: Conjunctivae are normal.  Neck: Neck supple.  Cardiovascular: Normal rate, regular rhythm and normal heart sounds.   Pulses:      Dorsalis pedis pulses are 1+ on the right side, and 1+ on the left side.       Posterior tibial pulses are 1+ on the right side, and 1+ on the left side.    Pulmonary/Chest: Effort normal and breath sounds normal. He has no wheezes. He has no rales.  Abdominal: Soft. He exhibits no distension.  Musculoskeletal: Normal range of motion.  Neurological: He is alert and oriented to person, place, and time.  Skin: Skin is warm and dry.  Psychiatric: He has a normal mood and affect.   Assessment & Plan:  Please refer to problem based charting.

## 2015-08-05 LAB — TSH: TSH: 1.44 u[IU]/mL (ref 0.450–4.500)

## 2015-08-07 NOTE — Assessment & Plan Note (Signed)
Assessment: Occurs with physical exertion with associated chest discomfort. Chest discomfort preciptated by exertion and relieved by rest and has >3 risk factors for atherosclerotic disease (HTN, DM, hyperlipidemia).  Plan: -Checked TSH which was 1.44 within normal limits. -Referral to cardiology for consideration of exercise stress test

## 2015-08-07 NOTE — Assessment & Plan Note (Signed)
BP Readings from Last 3 Encounters:  08/04/15 134/80  07/19/15 147/95  06/27/15 128/87    Lab Results  Component Value Date   NA 138 05/10/2015   K 4.0 05/10/2015   CREATININE 1.20 05/10/2015    Assessment: Blood pressure control: Controlled Progress toward BP goal:  At goal  Plan: Medications:  Continue lisinopril 5mg  daily

## 2015-08-07 NOTE — Assessment & Plan Note (Signed)
Lab Results  Component Value Date   HGBA1C 7.2 08/04/2015   HGBA1C 7.1 04/29/2015   HGBA1C 6.4 12/28/2014     Assessment: Diabetes control: Controlled Progress toward A1C goal:  At goal  Plan: Medications:  Continue metformin xr 500mg  daily with breakfast Other plans:  -Recheck A1c in 6 months. -Optometry referral for eye exam

## 2015-08-07 NOTE — Assessment & Plan Note (Signed)
Assessment: Has been unable to obtain Advair due to pharmacy being out of stock.  Plan: Will ask our pharmacist to help look into this.

## 2015-08-08 ENCOUNTER — Telehealth: Payer: Self-pay | Admitting: Pharmacist

## 2015-08-08 DIAGNOSIS — J45909 Unspecified asthma, uncomplicated: Secondary | ICD-10-CM

## 2015-08-08 MED ORDER — MOMETASONE FURO-FORMOTEROL FUM 200-5 MCG/ACT IN AERO: 1.0000 | INHALATION_SPRAY | Freq: Two times a day (BID) | RESPIRATORY_TRACT | Status: DC

## 2015-08-08 NOTE — Progress Notes (Signed)
Internal Medicine Clinic Attending  Case discussed with Dr. Krall soon after the resident saw the patient.  We reviewed the resident's history and exam and pertinent patient test results.  I agree with the assessment, diagnosis, and plan of care documented in the resident's note. 

## 2015-08-08 NOTE — Telephone Encounter (Signed)
Patient contacted for help with inhaler access. Patient advised he meets qualifications for free Advair and will need to submit financial paperwork over to [facility]. They no longer carry free samples to hold him over so I will send a 1-time Rx for free Dulera to pharmacy to have one until paperwork goes through. Patient verbalized understanding by repeat back.

## 2015-08-22 ENCOUNTER — Encounter: Payer: Self-pay | Admitting: Sports Medicine

## 2015-08-26 ENCOUNTER — Encounter: Payer: Self-pay | Admitting: Cardiology

## 2015-08-26 ENCOUNTER — Ambulatory Visit (INDEPENDENT_AMBULATORY_CARE_PROVIDER_SITE_OTHER): Payer: Self-pay | Admitting: Sports Medicine

## 2015-08-26 ENCOUNTER — Encounter: Payer: Self-pay | Admitting: Sports Medicine

## 2015-08-26 ENCOUNTER — Ambulatory Visit (INDEPENDENT_AMBULATORY_CARE_PROVIDER_SITE_OTHER): Payer: No Typology Code available for payment source | Admitting: Cardiology

## 2015-08-26 VITALS — BP 126/84 | HR 70 | Ht 73.0 in | Wt 200.0 lb

## 2015-08-26 VITALS — BP 128/82 | Ht 73.0 in | Wt 206.0 lb

## 2015-08-26 DIAGNOSIS — R0789 Other chest pain: Secondary | ICD-10-CM

## 2015-08-26 DIAGNOSIS — R079 Chest pain, unspecified: Secondary | ICD-10-CM | POA: Insufficient documentation

## 2015-08-26 DIAGNOSIS — M5010 Cervical disc disorder with radiculopathy, unspecified cervical region: Secondary | ICD-10-CM

## 2015-08-26 DIAGNOSIS — M5030 Other cervical disc degeneration, unspecified cervical region: Secondary | ICD-10-CM

## 2015-08-26 MED ORDER — METHYLPREDNISOLONE ACETATE 80 MG/ML IJ SUSP
80.0000 mg | Freq: Once | INTRAMUSCULAR | Status: AC
Start: 2015-08-26 — End: 2015-08-26
  Administered 2015-08-26: 80 mg via INTRAMUSCULAR

## 2015-08-26 NOTE — Progress Notes (Signed)
   Subjective:    Patient ID: Javier Beasley, male    DOB: 03/19/1966, 49 y.o.   MRN: [id number]  HPI   Patient comes in today with returning right arm radiculopathy. He has a documented history of cervical degenerative disc disease. He has had several cervical epidural steroid injections. Initial injections were helpful but his most recent injection was not as effective. He was recently placed on oral prednisone by his PCP but that too has not been helpful. He describes a constant ache in the right triceps with frequent numbness and tingling radiating into the right forearm and into the first, second, and third digits of the right hand. He is beginning to notice weakness as well. He denies any neck pain.    Review of Systems     Objective:   Physical Exam Well-developed, well-nourished. No acute distress  Cervical spine: Full cervical range of motion. Positive Spurling's to the right  Neurological exam: 4+/5 strength with resisted triceps on the right compared to the left. Remainder of his strength is equal to the uninvolved left arm. No atrophy. Biceps and triceps reflexes appear equal bilaterally. Trace brachioradialis reflex on the right, 1+ on the left. Sensation is intact to light touch grossly. Good radial ulnar pulses. Good grip strength.  MRI of the cervical spine from December 7 of 2015 is reviewed. There is moderate degenerative disc disease at C5-C6 which results in moderate bilateral neural foraminal stenosis at this level. He also has uncovertebral hypertrophy at C6-C7 which results in moderate bilateral neural foraminal stenosis at this level as well.       Assessment & Plan:  Chronic right arm cervical radiculopathy secondary to cervical degenerative disc disease  Patient has failed conservative treatment to date including oral medications and epidural steroid injections. Therefore, I will refer the patient to Dr.Nitka for his input. I'll defer further workup and treatment  of his cervical spine to the discretion of Dr. Otelia SergeantNitka. Patient will follow-up with me as needed.

## 2015-08-26 NOTE — Patient Instructions (Signed)
Your physician recommends that you schedule a follow-up appointment in: As Needed  Merry Christmas and Happy New Year!! 

## 2015-08-26 NOTE — Progress Notes (Signed)
Cardiology Office Note   Date:  08/26/2015   ID:  Javier Beasley, DOB [birth year], MRN [id number]  PCP:  Griffin Basil, MD  Cardiologist:   Rollene Rotunda, MD   No chief complaint on file.     History of Present Illness: Javier Beasley is a 49 y.o. male who presents for evaluation of chest pain.  He has no prior cardiac history although he does have risk factors. He did have chest pain a couple of years ago. He had a stress perfusion study I reviewed these results area was negative for any evidence of ischemia.  He also had an echo that was normal. I reviewed these results. He has mildly abnormal EKG that is unchanged today from 2014. He says his chest discomfort is similar to previous. This has been happening for about 2 months. Feels some discomfort in his left chest.  It happens at rest.  It feels like he has a fullness and gas bubble in his chest. Has a matter fact when he burps it goes away. It's happened about 2 times per month. It is sporadic it seems to be  Improved since he started proton asked. He has had problems with swallowing food. He's had dilatation of his esophagus apparently. He does not report associated shortness of breath, PND or orthopnea. He's not describing palpitations, presyncope or syncope. Of note he is limited in his activities by multiple joint pains.    Past Medical History  Diagnosis Date  . Asthma   . Chronic back pain   . Bilateral chronic knee pain   . Chronic shoulder pain   . Hypertension   . Reflux   . Depression   . Hx of echocardiogram     a. Echo 10/24/12:  EF 60-65%, normal wall thickness, normal diastolic fxn  . Hx of cardiovascular stress test     a. Lexiscan Myoview 10/29/12:  EF 56%, no ischemia  . Allergy   . Hyperlipidemia   . Diabetes mellitus without complication (HCC)   . Renal disorder     chronic kidney disease  . History of herniated intervertebral disc   . GERD (gastroesophageal reflux disease)     Past Surgical History    Procedure Laterality Date  . Esophagogastroduodenoscopy Left 10/24/2012    Procedure: ESOPHAGOGASTRODUODENOSCOPY (EGD);  Surgeon: Willis Modena, MD;  Location: [facility] ENDOSCOPY;  Service: Endoscopy;  Laterality: Left;  possible balloon dilation  . Balloon dilation N/A 10/24/2012    Procedure: BALLOON DILATION;  Surgeon: Willis Modena, MD;  Location: [facility] ENDOSCOPY;  Service: Endoscopy;  Laterality: N/A;  . Knee arthroscopy with excision plica  05/11/2015    Procedure: KNEE ARTHROSCOPY WITH EXCISION PLICA;  Surgeon: Tarry Kos, MD;  Location: [facility];  Service: Orthopedics;;  . Synovectomy Right 05/11/2015    Procedure: SYNOVECTOMY;  Surgeon: Tarry Kos, MD;  Location: [facility];  Service: Orthopedics;  Laterality: Right;  . Knee arthroscopy with lateral menisectomy  05/11/2015    Procedure: KNEE ARTHROSCOPY WITH LATERAL MENISECTOMY;  Surgeon: Tarry Kos, MD;  Location: [facility];  Service: Orthopedics;;     Current Outpatient Prescriptions  Medication Sig Dispense Refill  . albuterol (PROVENTIL HFA;VENTOLIN HFA) 108 (90 BASE) MCG/ACT inhaler Inhale 2 puffs into the lungs every 6 (six) hours as needed for wheezing or shortness of breath. 18 g 3  . atorvastatin (LIPITOR) 40 MG tablet Take 2 tablets (80 mg total) by mouth daily at 6 PM.  60 tablet 3  . cyclobenzaprine (FLEXERIL) 10 MG tablet Take 1 tablet (10 mg total) by mouth 3 (three) times daily as needed for muscle spasms. 60 tablet 0  . docusate sodium (COLACE) 100 MG capsule Take 1-2 capsules (100-200 mg total) by mouth daily as needed. 30 capsule 1  . lisinopril (PRINIVIL,ZESTRIL) 5 MG tablet Take 1 tablet (5 mg total) by mouth daily. 90 tablet 3  . metFORMIN (GLUCOPHAGE XR) 500 MG 24 hr tablet Take 1 tablet (500 mg total) by mouth daily with breakfast. 90 tablet 3  . mometasone-formoterol (DULERA) 200-5 MCG/ACT AERO Inhale 1 puff into the lungs 2 (two) times daily. PHARMACIST: VOUCHER BIN#  [id number]; PCN# [id number]; Group# [id number]; ID# [id number] 1 Inhaler 0  . pantoprazole (PROTONIX) 40 MG tablet Take 1 tablet (40 mg total) by mouth daily. 30 tablet 1  . traMADol (ULTRAM) 50 MG tablet Take 1 tablet (50 mg total) by mouth every 6 (six) hours as needed. 15 tablet 0   No current facility-administered medications for this visit.    Allergies:   Yeast-related products and Pork-derived products    Social History:  The patient  reports that he has never smoked. He has never used smokeless tobacco. He reports that he drinks about 1.8 oz of alcohol per week. He reports that he uses illicit drugs (Marijuana).   Family History:  The patient's family history includes Asthma in his daughter; Diabetes in his mother and other; Heart attack in his other; Hypertension in his brother, mother, other, and sister; Stroke in his sister. There is no history of Hyperlipidemia or Sudden death.    ROS:  Please see the history of present illness.   Otherwise, review of systems are positive for none.   All other systems are reviewed and negative.    PHYSICAL EXAM: VS:  BP 126/84 mmHg  Pulse 70  Ht 6\' 1"  (1.854 m)  Wt 200 lb (90.719 kg)  BMI 26.39 kg/m2 , BMI Body mass index is 26.39 kg/(m^2). GENERAL:  Well appearing HEENT:  Pupils equal round and reactive, fundi not visualized, oral mucosa unremarkable NECK:  No jugular venous distention, waveform within normal limits, carotid upstroke brisk and symmetric, no bruits, no thyromegaly LYMPHATICS:  No cervical, inguinal adenopathy LUNGS:  Clear to auscultation bilaterally BACK:  No CVA tenderness CHEST:  Unremarkable HEART:  PMI not displaced or sustained,S1 and S2 within normal limits, no S3, no S4, no clicks, no rubs, no murmurs ABD:  Flat, positive bowel sounds normal in frequency in pitch, no bruits, no rebound, no guarding, no midline pulsatile mass, no hepatomegaly, no splenomegaly EXT:  2 plus pulses throughout, no edema, no cyanosis no  clubbing SKIN:  No rashes no nodules NEURO:  Cranial nerves II through XII grossly intact, motor grossly intact throughout PSYCH:  Cognitively intact, oriented to person place and time    EKG:  EKG is ordered today. The ekg ordered today demonstrates sinus rhythm, rate 70, axis within normal limits, intervals within normal limits, T wave inversions in inferior leads unchanged from previous.     Recent Labs: 05/10/2015: BUN 9; Creatinine, Ser 1.20; Potassium 4.0; Sodium 138 05/11/2015: Hemoglobin 14.1 08/04/2015: TSH 1.440    Lipid Panel    Component Value Date/Time   CHOL 210* 01/20/2015 1104   TRIG 93 01/20/2015 1104   HDL 59 01/20/2015 1104   CHOLHDL 3.6 01/20/2015 1104   VLDL 19 01/20/2015 1104   LDLCALC 132* 01/20/2015 1104     Lab Results  Component Value Date   HGBA1C 7.2 08/04/2015    Wt Readings from Last 3 Encounters:  08/26/15 200 lb (90.719 kg)  08/26/15 206 lb (93.441 kg)  08/04/15 200 lb 8 oz (90.946 kg)      Other studies Reviewed: Additional studies/ records that were reviewed today include: Lexiscan Myoview and echo. Review of the above records demonstrates:  Please see elsewhere in the note.     ASSESSMENT AND PLAN:  CHEST PAIN:    His pain is atypical and consistent with GI. At this point no change in therapy is indicated.  HE NEEDS CONTINUED RISK REDUCTION.   DYSLIPIDEMIA:  He will remain on the statin as listed. Perhaps at target should be less than 100 but I will defer to his PCP. Of note I went back and saw that a lipid profile was 190 LDL when he was not on statin.  DM:  The patient's A1c is 7.2. He will remain on the meds as listed.  Current medicines are reviewed at length with the patient today.  The patient does not have concerns regarding medicines.  The following changes have been made:  no change  Labs/ tests ordered today include: None  No orders of the defined types were placed in this encounter.     Disposition:   FU with  me as needed.      Signed, Rollene Rotunda, MD  08/26/2015 3:52 PM    [facility]

## 2015-08-26 NOTE — Patient Instructions (Signed)
[facility] Dr Vira BrownsJames Nitka Thursday January 12th 2017 at 3pm Bring $100 to the first visit and they will set up a payment plan thereafter [address]  [place], [place] [address] [phone]

## 2015-09-28 ENCOUNTER — Ambulatory Visit: Payer: No Typology Code available for payment source

## 2015-10-03 MED FILL — traMADol HCL 50 MG TABS: 50 | 13 days supply | Qty: 40 | Fill #0

## 2015-10-03 MED FILL — METHOCARBAMOL 750 MG TABLET: 750 | 20 days supply | Qty: 60 | Fill #0

## 2015-10-04 ENCOUNTER — Other Ambulatory Visit: Payer: Self-pay | Admitting: Pulmonary Disease

## 2015-10-07 ENCOUNTER — Other Ambulatory Visit (HOSPITAL_COMMUNITY): Payer: Self-pay | Admitting: Specialist

## 2015-10-07 DIAGNOSIS — M542 Cervicalgia: Secondary | ICD-10-CM

## 2015-10-13 ENCOUNTER — Other Ambulatory Visit: Payer: Self-pay | Admitting: Pulmonary Disease

## 2015-10-14 MED FILL — ?DULERA 200 MCG/5 MCG INH: 200-5 MCG | 30 days supply | Qty: 2 | Fill #0

## 2015-10-21 ENCOUNTER — Ambulatory Visit (HOSPITAL_COMMUNITY)
Admission: RE | Admit: 2015-10-21 | Discharge: 2015-10-21 | Disposition: A | Discharge status: Home / Self Care | Payer: No Typology Code available for payment source | Source: Ambulatory Visit | Attending: Specialist | Admitting: Specialist

## 2015-10-21 DIAGNOSIS — M542 Cervicalgia: Secondary | ICD-10-CM | POA: Insufficient documentation

## 2015-10-21 DIAGNOSIS — J320 Chronic maxillary sinusitis: Secondary | ICD-10-CM | POA: Insufficient documentation

## 2015-10-21 DIAGNOSIS — M47812 Spondylosis without myelopathy or radiculopathy, cervical region: Secondary | ICD-10-CM | POA: Insufficient documentation

## 2015-10-21 DIAGNOSIS — J322 Chronic ethmoidal sinusitis: Secondary | ICD-10-CM | POA: Insufficient documentation

## 2015-10-21 DIAGNOSIS — M5032 Other cervical disc degeneration, mid-cervical region, unspecified level: Secondary | ICD-10-CM | POA: Insufficient documentation

## 2015-10-24 ENCOUNTER — Other Ambulatory Visit: Payer: Self-pay | Admitting: Internal Medicine

## 2015-10-24 DIAGNOSIS — J4540 Moderate persistent asthma, uncomplicated: Secondary | ICD-10-CM

## 2015-10-24 MED ORDER — MOMETASONE FURO-FORMOTEROL FUM 200-5 MCG/ACT IN AERO: 1.0000 | INHALATION_SPRAY | Freq: Two times a day (BID) | RESPIRATORY_TRACT | Status: DC

## 2015-10-24 MED ORDER — ALBUTEROL SULFATE HFA 108 (90 BASE) MCG/ACT IN AERS: 2.0000 | INHALATION_SPRAY | Freq: Four times a day (QID) | RESPIRATORY_TRACT | Status: DC | PRN

## 2015-11-03 ENCOUNTER — Encounter: Payer: Self-pay | Admitting: Pulmonary Disease

## 2015-11-03 ENCOUNTER — Ambulatory Visit (INDEPENDENT_AMBULATORY_CARE_PROVIDER_SITE_OTHER): Payer: Self-pay | Admitting: Pulmonary Disease

## 2015-11-03 ENCOUNTER — Ambulatory Visit: Payer: No Typology Code available for payment source | Attending: Specialist

## 2015-11-03 VITALS — BP 148/85 | HR 69 | Temp 97.0°F | Ht 73.0 in | Wt 199.7 lb

## 2015-11-03 DIAGNOSIS — R6889 Other general symptoms and signs: Secondary | ICD-10-CM

## 2015-11-03 DIAGNOSIS — G8929 Other chronic pain: Secondary | ICD-10-CM

## 2015-11-03 DIAGNOSIS — R293 Abnormal posture: Secondary | ICD-10-CM | POA: Insufficient documentation

## 2015-11-03 DIAGNOSIS — M545 Low back pain, unspecified: Secondary | ICD-10-CM

## 2015-11-03 DIAGNOSIS — J45909 Unspecified asthma, uncomplicated: Secondary | ICD-10-CM

## 2015-11-03 DIAGNOSIS — E1122 Type 2 diabetes mellitus with diabetic chronic kidney disease: Secondary | ICD-10-CM

## 2015-11-03 DIAGNOSIS — M5412 Radiculopathy, cervical region: Secondary | ICD-10-CM | POA: Insufficient documentation

## 2015-11-03 DIAGNOSIS — N182 Chronic kidney disease, stage 2 (mild): Secondary | ICD-10-CM

## 2015-11-03 DIAGNOSIS — M436 Torticollis: Secondary | ICD-10-CM | POA: Insufficient documentation

## 2015-11-03 DIAGNOSIS — J4540 Moderate persistent asthma, uncomplicated: Secondary | ICD-10-CM

## 2015-11-03 LAB — HM DIABETES EYE EXAM

## 2015-11-03 NOTE — Progress Notes (Signed)
Subjective:    Patient ID: Javier Beasley, male    DOB: April 13, 1966, 50 y.o.   MRN: [id number]  HPI [name] is a 50 year old man with history of DM2, asthma, HTN, HLD, chronic knee/shoulder/back pain presenting for follow up of his asthma.  He is doing better with his pain. He has follow up with orthopedics and sports medicine for his chronic knee/shoulder/back pain.   He has been unable to fill his Advair.  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 He needs to have his activity restrictions evaluated and filled out. He talked to sports medicine and was told to speak to his PCP.  Review of Systems Musculoskeletal: +chronic arthralgias Neurological: +chronic paresthesias  Past Medical History  Diagnosis Date  . Asthma   . Chronic back pain   . Bilateral chronic knee pain   . Chronic shoulder pain   . Hypertension   . Reflux   . Depression   . Hx of echocardiogram     a. Echo 10/24/12:  EF 60-65%, normal wall thickness, normal diastolic fxn  . Hx of cardiovascular stress test     a. Lexiscan Myoview 10/29/12:  EF 56%, no ischemia  . Allergy   . Hyperlipidemia   . Diabetes mellitus without complication (HCC)     diagnosed in 2015  . Renal disorder     chronic kidney disease  . History of herniated intervertebral disc   . GERD (gastroesophageal reflux disease)     Current Outpatient Prescriptions on File Prior to Visit  Medication Sig Dispense Refill  . albuterol (PROVENTIL HFA;VENTOLIN HFA) 108 (90 Base) MCG/ACT inhaler Inhale 2 puffs into the lungs every 6 (six) hours as needed for wheezing or shortness of breath. 54 g 3  . atorvastatin (LIPITOR) 40 MG tablet Take 2 tablets (80 mg total) by mouth daily at 6 PM. 60 tablet 3  . cyclobenzaprine (FLEXERIL) 10 MG tablet Take 1 tablet (10 mg total) by mouth 3 (three) times daily as needed for muscle spasms. 60 tablet 0  . docusate sodium (COLACE) 100 MG capsule Take  1-2 capsules (100-200 mg total) by mouth daily as needed. (Patient not taking: Reported on 11/03/2015) 30 capsule 1  . lisinopril (PRINIVIL,ZESTRIL) 5 MG tablet Take 1 tablet (5 mg total) by mouth daily. 90 tablet 3  . metFORMIN (GLUCOPHAGE XR) 500 MG 24 hr tablet Take 1 tablet (500 mg total) by mouth daily with breakfast. 90 tablet 3  . mometasone-formoterol (DULERA) 200-5 MCG/ACT AERO Inhale 1 puff into the lungs 2 (two) times daily. 3 Inhaler 3  . pantoprazole (PROTONIX) 40 MG tablet Take 1 tablet (40 mg total) by mouth daily. (Patient not taking: Reported on 11/03/2015) 30 tablet 1  . traMADol (ULTRAM) 50 MG tablet Take 1 tablet (50 mg total) by mouth every 6 (six) hours as needed. 15 tablet 0   No current facility-administered medications on file prior to visit.   Blood pressure 148/85, pulse 69, temperature 97 F (36.1 C), temperature source Oral, height  (1.854 m), weight 199 lb 11.2 oz (90.583 kg). Objective:  Physical Exam  Constitutional: He appears well-developed and well-nourished. No distress.  HENT:  Head: Normocephalic and atraumatic.  Eyes: No scleral icterus.  Pulmonary/Chest: Effort normal. No respiratory distress.  Neurological: He is alert.  Psychiatric: His affect is angry. He is agitated.   Assessment & Plan:  Chronic low back pain Assessment: Ongoing physical therapy and  seeing sports medicine and orthopedic surgery. He is trying to get disability for his chronic pain. Has been dealing with this for the past 3 years at least and has recently changed to a different lawyer. He was told that he should see his PCP to fill out paperwork on his activity restrictions.  This is not in my scope of practice. I explained to him that he would have to be evaluated by an occupational therapist/provider that is experienced in evaluating work/physical restrictions. He said that his previous lawyer got access to his medical records and filled for disability based on her review of  his records. Unclear whether he has actually been formally evaluated. He can either have his new lawyer find him someone experienced in evaluation of occupational activity and restrictions or provide documentation previously done that show that he has been evaluated formally and what their conclusions were.  He was very angry and agitated that we cannot just base it off of his reports of pain and somehow deduce his activity limitations based on chart review. He stated that he would like to be seen by a new PCP.  Plan: -Provided information on [employer]'s employment services.  Diabetes mellitus with stage 2 chronic kidney disease Assessment: Eye exam due.  Plan: Retinal scan performed.  Asthma Assessment: Has been unable to fill prescription for Advair or Dulera due to cost.  Plan: He will be applying for medication assistance program.

## 2015-11-03 NOTE — Assessment & Plan Note (Signed)
Assessment: Eye exam due.  Plan: Retinal scan performed.

## 2015-11-03 NOTE — Assessment & Plan Note (Signed)
Assessment: Ongoing physical therapy and seeing sports medicine and orthopedic surgery. He is trying to get disability for his chronic pain. Has been dealing with this for the past 3 years at least and has recently changed to a different lawyer. He was told that he should see his PCP to fill out paperwork on his activity restrictions.  This is not in my scope of practice. I explained to him that he would have to be evaluated by an occupational therapist/provider that is experienced in evaluating work/physical restrictions. He said that his previous lawyer got access to his medical records and filled for disability based on her review of his records. Unclear whether he has actually been formally evaluated. He can either have his new lawyer find him someone experienced in evaluation of occupational activity and restrictions or provide documentation previously done that show that he has been evaluated formally and what their conclusions were.  He was very angry and agitated that we cannot just base it off of his reports of pain and somehow deduce his activity limitations based on chart review. He stated that he would like to be seen by a new PCP.  Plan: -Provided information on [employer]'s employment services.

## 2015-11-03 NOTE — Assessment & Plan Note (Signed)
Assessment: Has been unable to fill prescription for Advair or Dulera due to cost.  Plan: He will be applying for medication assistance program.

## 2015-11-03 NOTE — Patient Instructions (Addendum)
Instructed in cervical posture stretching for home . Handout issued for home 6-8x/day 3-5 reps hold 3-5 sec

## 2015-11-03 NOTE — Therapy (Signed)
[facility] [address] [place], [place], [address] Phone: [phone]   Fax:  [phone]  Physical Therapy Evaluation  Patient Details  Name: Javier Beasley MRN: [id number] Date of Birth: 11-10-1965 Referring Provider: Vira Browns, MD  Encounter Date: 11/03/2015      PT End of Session - 11/03/15 1058    Visit Number 1   Number of Visits 12   Date for PT Re-Evaluation 12/15/15   PT Start Time 1005   PT Stop Time 1053   PT Time Calculation (min) 48 min   Activity Tolerance Patient tolerated treatment well   Behavior During Therapy [facility] for tasks assessed/performed      Past Medical History  Diagnosis Date  . Asthma   . Chronic back pain   . Bilateral chronic knee pain   . Chronic shoulder pain   . Hypertension   . Reflux   . Depression   . Hx of echocardiogram     a. Echo 10/24/12:  EF 60-65%, normal wall thickness, normal diastolic fxn  . Hx of cardiovascular stress test     a. Lexiscan Myoview 10/29/12:  EF 56%, no ischemia  . Allergy   . Hyperlipidemia   . Diabetes mellitus without complication (HCC)   . Renal disorder     chronic kidney disease  . History of herniated intervertebral disc   . GERD (gastroesophageal reflux disease)     Past Surgical History  Procedure Laterality Date  . Esophagogastroduodenoscopy Left 10/24/2012    Procedure: ESOPHAGOGASTRODUODENOSCOPY (EGD);  Surgeon: Willis Modena, MD;  Location: [facility] END[facility]COPY;  Service: Endoscopy;  Laterality: Left;  possible balloon dilation  . Balloon dilation N/A 10/24/2012    Procedure: BALLOON DILATION;  Surgeon: Willis Modena, MD;  Location: [facility] ENDOSCOPY;  Service: Endoscopy;  Laterality: N/A;  . Knee arthroscopy with excision plica  05/11/2015    Procedure: KNEE ARTHROSCOPY WITH EXCISION PLICA;  Surgeon: Tarry Kos, MD;  Location: [facility];  Service: Orthopedics;;  . Synovectomy Right 05/11/2015    Procedure: SYNOVECTOMY;  Surgeon:  Tarry Kos, MD;  Location: [facility];  Service: Orthopedics;  Laterality: Right;  . Knee arthroscopy with lateral menisectomy  05/11/2015    Procedure: KNEE ARTHROSCOPY WITH LATERAL MENISECTOMY;  Surgeon: Tarry Kos, MD;  Location: [facility];  Service: Orthopedics;;    There were no vitals filed for this visit.  Visit Diagnosis:  Abnormal posture - Plan: PT plan of care cert/re-cert  Cervical radicular pain - Plan: PT plan of care cert/re-cert  Stiffness of neck - Plan: PT plan of care cert/re-cert  Activity intolerance - Plan: PT plan of care cert/re-cert      Subjective Assessment - 11/03/15 1010    Subjective Pinched nerve in neck with numbness in RT arm. No injury.  Decreased space and bone spurs in neck affecting nerve. Head aachs. Hard to type, hold phone, lifting pots and pans hard, cleaning   Limitations House hold activities  looking up and down   How long can you sit comfortably? 30 min   How long can you stand comfortably? Varies 45 min   How long can you walk comfortably? 2 blocks   Diagnostic tests MRI   Patient Stated Goals REleive pressure off nerves   Currently in Pain? Yes   Pain Score --  7.5   Pain Orientation Right   Pain Descriptors / Indicators Numbness;Aching   Pain Type Chronic pain   Pain  Radiating Towards RT triceps to thumb and to middle finger, occasional back spasms   Pain Onset More than a month ago   Pain Frequency Other (Comment)   Aggravating Factors  See above   Pain Relieving Factors medications   Multiple Pain Sites No            [facility] PT Assessment - 11/03/15 0952    Assessment   Medical Diagnosis Cervical spondylosis   Referring Provider Vira Browns, MD   Onset Date/Surgical Date --  4 months ago   Next MD Visit 11/24/15   Prior Therapy Not for neck for his back a year ago   Precautions   Precautions None   Restrictions   Weight Bearing Restrictions No   Balance Screen   Has the patient  fallen in the past 6 months No   Has the patient had a decrease in activity level because of a fear of falling?  No   Is the patient reluctant to leave their home because of a fear of falling?  No   Prior Function   Level of Independence Independent   Cognition   Overall Cognitive Status Within Functional Limits for tasks assessed   ROM / Strength   AROM / PROM / Strength AROM;Strength   AROM   AROM Assessment Site Cervical   Cervical Flexion 35   Cervical Extension 47   Cervical - Right Side Bend 32   Cervical - Left Side Bend 30   Cervical - Right Rotation 62   Cervical - Left Rotation 60   Strength   Overall Strength Comments finger abduction   Palpation   Palpation comment Tender cervical paraspinals more RT. Also incr arm symptoms with RT sidebend and ext positioning. RT ar symptoms improved with retraction posture and tractions   Ambulation/Gait   Gait Comments WNL                   [facility] Adult PT Treatment/Exercise - 11/03/15 0952    Posture/Postural Control   Posture Comments forward head, educated on good posture and cervical retraction  and hand out issued   Modalities   Modalities Traction   Traction   Type of Traction Cervical   Min (lbs) 12   Max (lbs) 5   Hold Time 45   Rest Time 15   Time 12                PT Education - 11/03/15 1042    Education provided Yes   Education Details POC, expectations of traction and good posture   Methods Explanation;Tactile cues;Verbal cues;Handout;Demonstration   Comprehension Returned demonstration;Verbalized understanding          PT Short Term Goals - 11/03/15 1052    PT SHORT TERM GOAL #1   Title He will be independent with initial posture exercises and understand good posture.    Time 3   Period Weeks   Status New   PT SHORT TERM GOAL #2   Title He will have 40% decr RT arm symptoms  with normal activity at home   Time 3   Period Weeks   Status New   PT SHORT TERM GOAL #3   Title His  cervical sidebending will improve to 40 degrees bilat withut incr arm symptoms   Time 3   Period Weeks   Status New           PT Long Term Goals - 11/03/15 1054    PT LONG TERM GOAL #  1   Title He will report pain /su\ymptoms into RT arm as intermittant and improved 75% or more with home tasks iincluding lifting trash.    Time 6   Period Weeks   Status New   PT LONG TERM GOAL #2   Title Cervical rotation improved to 70 degrees without RT arm symptoms /   Time 6   Period Weeks   Status New   PT LONG TERM GOAL #3   Title He will report ability to lok up and down with 1-2 max neck pain , no RT arm symptoms.    PT LONG TERM GOAL #4   Title He will report no pain with lifting pots and pans and typing   Time 6   Period Weeks   Status New               Plan - 11/03/15 1059    Clinical Impression Statement Javier Beasley  presents with forward head, dcreased ne ck ROM, pain with movement and at rest and symptoms into RT UE. This limits im in normal daily activity.Marland Kitchen He should improve with skilled PT   Pt will benefit from skilled therapeutic intervention in order to improve on the following deficits Pain;Postural dysfunction;Increased muscle spasms;Decreased activity tolerance;Decreased range of motion   Rehab Potential Good   PT Frequency 2x / week   PT Duration 6 weeks   PT Treatment/Interventions Cryotherapy;Electrical Stimulation;Iontophoresis /ml Dexamethasone;Moist Heat;Traction;Ultrasound;Therapeutic exercise;Patient/family education;Manual techniques;Dry needling;Taping   PT Next Visit Plan Modalities , traction if it did not irritate and manual treatments   PT Home Exercise Plan posture   Consulted and Agree with Plan of Care Patient         Problem List Patient Active Problem List   Diagnosis Date Noted  . Chest pain 08/26/2015  . Dizziness 08/04/2015  . Acute meniscal tear of right knee 04/29/2015  . Foot pain, left 01/20/2015  . Hemorrhoids 12/28/2014  .  Essential hypertension 09/21/2014  . Diabetes mellitus with stage 2 chronic kidney disease (HCC) 07/05/2014  . Health care maintenance 07/05/2014  . Labral tear of shoulder 02/11/2014  . Pain in joint, shoulder region 12/28/2013  . HLD (hyperlipidemia) 11/03/2012  . CKD (chronic kidney disease) stage 2, GFR 60-89 ml/min 11/03/2012  . Esophagitis 10/24/2012  . Asthma 10/23/2012  . Allergic reaction 10/23/2012  . Chronic low back pain 11/12/2011  . Bilateral knee pain 11/12/2011    Caprice Red PT  11/03/2015, 11:04 AM  [facility] [address] [place], [place], [address] Phone: [phone]   Fax:  [phone]  Name: Javier Beasley MRN: [id number] Date of Birth: 1965/10/21

## 2015-11-04 ENCOUNTER — Telehealth: Payer: Self-pay | Admitting: *Deleted

## 2015-11-04 NOTE — Progress Notes (Signed)
Internal Medicine Clinic Attending  Case discussed with Dr. Krall at the time of the visit.  We reviewed the resident's history and exam and pertinent patient test results.  I agree with the assessment, diagnosis, and plan of care documented in the resident's note.  

## 2015-11-04 NOTE — Telephone Encounter (Signed)
Patient was seen yesterday by Dr. Isabella Bowens and his albuterol was not filled and he states it is urgent that he gets it today since he was seen yesterday and needs.  He also stated he now has the [employer] and wants to make sure he is getting something he can afford and would like for someone to call him back to today.

## 2015-11-07 ENCOUNTER — Encounter: Payer: Self-pay | Admitting: Pharmacist

## 2015-11-07 ENCOUNTER — Ambulatory Visit (INDEPENDENT_AMBULATORY_CARE_PROVIDER_SITE_OTHER): Payer: Self-pay | Admitting: Pharmacist

## 2015-11-07 DIAGNOSIS — Z7951 Long term (current) use of inhaled steroids: Secondary | ICD-10-CM

## 2015-11-07 DIAGNOSIS — Z79899 Other long term (current) drug therapy: Secondary | ICD-10-CM

## 2015-11-07 DIAGNOSIS — J4540 Moderate persistent asthma, uncomplicated: Secondary | ICD-10-CM

## 2015-11-07 NOTE — Telephone Encounter (Signed)
Dr. Selena Batten, did you and Kenney Houseman get a chance to do MAP with this patient.

## 2015-11-07 NOTE — Progress Notes (Signed)
S:    Javier Beasley is a 50 y.o. male reports to clinical pharmacist appointment for asthma medication management. Patient did not bring inhaled medications.  Asthma medications Mometasone-formoterol (Dulera) 200-5 mcg/actuation, 1 puff BID Albuterol 90 mcg/actuation, 2 puffs Q 6 hours PRN wheezing or shortness of breath  Patient reports he has not yet obtained his inhalers from [facility]. Patient was advised to follow up to ensure [facility] enrollment.  Allergies  Allergen Reactions  . Yeast-Related Products Shortness Of Breath  . Pork-Derived Products     Does not eat pork   Patient states asthma is generally well-controlled as long as he has his inhalers. Reports inhaling 2 puffs of Dulera in the morning. Advised patient to inhale 1 puff in the morning, 1 puff in the evening, and rinse mouth after each use. Inhalers were reviewed with the patient, including name, instructions, indication, goals of therapy, potential side effects, importance of adherence, and safe use.  Medication Samples have been provided to the patient.  Drug name: mometasone-formoterol [name]) Qty: 1  LOT: [id number] Exp.Date: 09/04/16 Drug name: albuterol (Ventolin) Qty: 1  LOT: [id number]  Exp.Date: Jan 2018  Patient also had questions regarding pain medications and stated he prefers not to take tramadol due to sedation. Provided information on safe use of acetaminophen.  A/P: No changes recommended at this time.  Follow-up Patient advised to follow-up if further medication-related questions arise. Patient verbalized understanding by repeating back information and was advised to contact me if further medication-related questions arise. Patient was also provided an information handout.  Kim,Jennifer J  15 minutes spent face-to-face with the patient during the encounter. 50% of time spent on education. 50% of time was spent on assessment and plan.

## 2015-11-07 NOTE — Patient Instructions (Signed)
Dulera (mometasone-formoterol) 200-5 mcg instructions: inhale 1 puff twice daily (rinse mouth after each use) Ventolin (albuterol) 90 mcg instructions: inhale 2 puffs every 6 hours as needed for wheezing or shortness of breath  Acetaminophen tablets or caplets What is this medicine? ACETAMINOPHEN (a set a MEE noe fen) is a pain reliever. It is used to treat mild pain and fever. This medicine may be used for other purposes; ask your health care provider or pharmacist if you have questions. What should I tell my health care provider before I take this medicine? They need to know if you have any of these conditions: -if you often drink alcohol -liver disease -an unusual or allergic reaction to acetaminophen, other medicines, foods, dyes, or preservatives -pregnant or trying to get pregnant -breast-feeding How should I use this medicine? Take this medicine by mouth with a glass of water. Follow the directions on the package or prescription label. Take your medicine at regular intervals. Do not take your medicine more often than directed. Talk to your pediatrician regarding the use of this medicine in children. While this drug may be prescribed for children as young as 2 years of age for selected conditions, precautions do apply. Overdosage: If you think you have taken too much of this medicine contact a poison control center or emergency room at once. NOTE: This medicine is only for you. Do not share this medicine with others. What if I miss a dose? If you miss a dose, take it as soon as you can. If it is almost time for your next dose, take only that dose. Do not take double or extra doses. What may interact with this medicine? -alcohol -imatinib -isoniazid -other medicines with acetaminophen This list may not describe all possible interactions. Give your health care provider a list of all the medicines, herbs, non-prescription drugs, or dietary supplements you use. Also tell them if you  smoke, drink alcohol, or use illegal drugs. Some items may interact with your medicine. What should I watch for while using this medicine? Tell your doctor or health care professional if the pain lasts more than 10 days (5 days for children), if it gets worse, or if there is a new or different kind of pain. Also, check with your doctor if a fever lasts for more than 3 days. Do not take other medicines that contain acetaminophen with this medicine. Always read labels carefully. If you have questions, ask your doctor or pharmacist. If you take too much acetaminophen get medical help right away. Too much acetaminophen can be very dangerous and cause liver damage. Even if you do not have symptoms, it is important to get help right away. What side effects may I notice from receiving this medicine? Side effects that you should report to your doctor or health care professional as soon as possible: -allergic reactions like skin rash, itching or hives, swelling of the face, lips, or tongue -breathing problems -fever or sore throat -redness, blistering, peeling or loosening of the skin, including inside the mouth -trouble passing urine or change in the amount of urine -unusual bleeding or bruising -unusually weak or tired -yellowing of the eyes or skin Side effects that usually do not require medical attention (report to your doctor or health care professional if they continue or are bothersome): -headache -nausea, stomach upset This list may not describe all possible side effects. Call your doctor for medical advice about side effects. You may report side effects to FDA at 1-800-FDA-1088. Where should I keep  my medicine? Keep out of reach of children. Store at room temperature between 20 and 25 degrees C (68 and 77 degrees F). Protect from moisture and heat. Throw away any unused medicine after the expiration date. NOTE: This sheet is a summary. It may not cover all possible information. If you have  questions about this medicine, talk to your doctor, pharmacist, or health care provider.    2016, Elsevier/Gold Standard. (2013-04-27 12:54:16)

## 2015-11-07 NOTE — Telephone Encounter (Signed)
Hi, yes we worked on this today, I just need to get the doctor's signature and financial paperwork from [facility]. Patient signed the paperwork today and I gave him samples to hold him over until the paperwork gets processed. Thank you!

## 2015-11-09 ENCOUNTER — Ambulatory Visit: Payer: No Typology Code available for payment source

## 2015-11-09 DIAGNOSIS — M436 Torticollis: Secondary | ICD-10-CM

## 2015-11-09 DIAGNOSIS — R293 Abnormal posture: Secondary | ICD-10-CM

## 2015-11-09 DIAGNOSIS — M5412 Radiculopathy, cervical region: Secondary | ICD-10-CM

## 2015-11-09 DIAGNOSIS — R6889 Other general symptoms and signs: Secondary | ICD-10-CM

## 2015-11-09 NOTE — Therapy (Signed)
[facility] [address] [place], [place], [address] Phone: [phone]   Fax:  [phone]  Physical Therapy Treatment  Patient Details  Name: Javier Beasley MRN: [id number] Date of Birth: 02/12/1966 Referring Provider: Vira Browns, MD  Encounter Date: 11/09/2015      PT End of Session - 11/09/15 0748    Visit Number 2   Date for PT Re-Evaluation 12/15/15   PT Start Time 0705   PT Stop Time 0800   PT Time Calculation (min) 55 min   Activity Tolerance Patient tolerated treatment well   Behavior During Therapy [facility] for tasks assessed/performed      Past Medical History  Diagnosis Date  . Asthma   . Chronic back pain   . Bilateral chronic knee pain   . Chronic shoulder pain   . Hypertension   . Reflux   . Depression   . Hx of echocardiogram     a. Echo 10/24/12:  EF 60-65%, normal wall thickness, normal diastolic fxn  . Hx of cardiovascular stress test     a. Lexiscan Myoview 10/29/12:  EF 56%, no ischemia  . Allergy   . Hyperlipidemia   . Diabetes mellitus without complication (HCC)     diagnosed in 2015  . Renal disorder     chronic kidney disease  . History of herniated intervertebral disc   . GERD (gastroesophageal reflux disease)     Past Surgical History  Procedure Laterality Date  . Esophagogastroduodenoscopy Left 10/24/2012    Procedure: ESOPHAGOGASTRODUODENOSCOPY (EGD);  Surgeon: Willis Modena, MD;  Location: [facility] ENDOSCOPY;  Service: Endoscopy;  Laterality: Left;  possible balloon dilation  . Balloon dilation N/A 10/24/2012    Procedure: BALLOON DILATION;  Surgeon: Willis Modena, MD;  Location: [facility] ENDOSCOPY;  Service: Endoscopy;  Laterality: N/A;  . Knee arthroscopy with excision plica  05/11/2015    Procedure: KNEE ARTHROSCOPY WITH EXCISION PLICA;  Surgeon: Tarry Kos, MD;  Location: [facility];  Service: Orthopedics;;  . Synovectomy Right 05/11/2015    Procedure: SYNOVECTOMY;  Surgeon: Tarry Kos, MD;  Location: [facility];  Service: Orthopedics;  Laterality: Right;  . Knee arthroscopy with lateral menisectomy  05/11/2015    Procedure: KNEE ARTHROSCOPY WITH LATERAL MENISECTOMY;  Surgeon: Tarry Kos, MD;  Location: [facility];  Service: Orthopedics;;    There were no vitals filed for this visit.  Visit Diagnosis:  Abnormal posture  Cervical radicular pain  Stiffness of neck  Activity intolerance      Subjective Assessment - 11/09/15 0708    Subjective Morning  rough   Currently in Pain? Yes   Pain Score 8    Pain Location Neck   Pain Orientation Right   Pain Descriptors / Indicators Aching;Numbness   Pain Type Chronic pain   Pain Onset More than a month ago   Pain Frequency Constant   Multiple Pain Sites No                         [facility] Adult PT Treatment/Exercise - 11/09/15 0709    Neck Exercises: Supine   Neck Retraction 5 reps;5 secs   Capital Flexion 5 reps   Capital Flexion Limitations 3 sets with 5 fist grips , 5 ue rotation, hor adduction and abduction   Cervical Rotation Right;Left;5 reps   Lateral Flexion Right;Left  2 reps 10 sec each   Moist Heat Therapy   Number  Minutes Moist Heat 12 Minutes   Moist Heat Location Cervical   Ultrasound   Ultrasound Location RT neck   Ultrasound Parameters 100% , 1.2Wcm2,   Ultrasound Goals Pain   Traction   Type of Traction Cervical   Min (lbs) 5   Max (lbs) 15   Hold Time 45   Rest Time 15   Time 12   Manual Therapy   Soft tissue mobilization asssisted with Rockblade to RT neck.                   PT Short Term Goals - 11/03/15 1052    PT SHORT TERM GOAL #1   Title He will be independent with initial posture exercises and understand good posture.    Time 3   Period Weeks   Status New   PT SHORT TERM GOAL #2   Title He will have 40% decr RT arm symptoms  with normal activity at home   Time 3   Period Weeks   Status New   PT SHORT  TERM GOAL #3   Title His cervical sidebending will improve to 40 degrees bilat withut incr arm symptoms   Time 3   Period Weeks   Status New           PT Long Term Goals - 11/03/15 1054    PT LONG TERM GOAL #1   Title He will report pain /su\ymptoms into RT arm as intermittant and improved 75% or more with home tasks iincluding lifting trash.    Time 6   Period Weeks   Status New   PT LONG TERM GOAL #2   Title Cervical rotation improved to 70 degrees without RT arm symptoms /   Time 6   Period Weeks   Status New   PT LONG TERM GOAL #3   Title He will report ability to lok up and down with 1-2 max neck pain , no RT arm symptoms.    PT LONG TERM GOAL #4   Title He will report no pain with lifting pots and pans and typing   Time 6   Period Weeks   Status New               Plan - 11/09/15 0748    Clinical Impression Statement Javier Beasley  continues with neck pain and RT radiculopathy.  He repported mild headache for 10 min post last session. He was requesting I fill out a for for disability but he was informed I could not do this without him going through a [facility] for valid objective assessment of his abilities. It is unclear if the hospital will pay for this testing . He will talk to lawyer and the hospital for clearance on who will pay for testing or he will be out of pocket for this.   PT Next Visit Plan Modalities , traction if it did not irritate and manual treatments. stab exercise, posible neural glides   Consulted and Agree with Plan of Care Patient        Problem List Patient Active Problem List   Diagnosis Date Noted  . Chest pain 08/26/2015  . Dizziness 08/04/2015  . Acute meniscal tear of right knee 04/29/2015  . Foot pain, left 01/20/2015  . Hemorrhoids 12/28/2014  . Essential hypertension 09/21/2014  . Diabetes mellitus with stage 2 chronic kidney disease (HCC) 07/05/2014  . Health care maintenance 07/05/2014  . Labral tear of shoulder 02/11/2014  .  Pain in  joint, shoulder region 12/28/2013  . HLD (hyperlipidemia) 11/03/2012  . CKD (chronic kidney disease) stage 2, GFR 60-89 ml/min 11/03/2012  . Esophagitis 10/24/2012  . Asthma 10/23/2012  . Allergic reaction 10/23/2012  . Chronic low back pain 11/12/2011  . Bilateral knee pain 11/12/2011    Caprice Red PT 11/09/2015, 7:57 AM  [facility] [address] [place], [place], [address] Phone: [phone]   Fax:  [phone]  Name: Javier Beasley MRN: [id number] Date of Birth: 09-13-1966

## 2015-11-15 ENCOUNTER — Ambulatory Visit: Payer: No Typology Code available for payment source

## 2015-11-15 DIAGNOSIS — M436 Torticollis: Secondary | ICD-10-CM

## 2015-11-15 DIAGNOSIS — R6889 Other general symptoms and signs: Secondary | ICD-10-CM

## 2015-11-15 DIAGNOSIS — R293 Abnormal posture: Secondary | ICD-10-CM

## 2015-11-15 DIAGNOSIS — M5412 Radiculopathy, cervical region: Secondary | ICD-10-CM

## 2015-11-15 NOTE — Therapy (Signed)
[facility] [address] [place], [place], [address] Phone: [phone]   Fax:  [phone]  Physical Therapy Treatment  Patient Details  Name: ADRIN Beasley MRN: [id number] Date of Birth: [birth year] Referring Provider: Vira Browns, MD  Encounter Date: 11/15/2015      PT End of Session - 11/15/15 1534    Visit Number 3   Number of Visits 12   Date for PT Re-Evaluation 12/15/15   PT Start Time 0310  8 min late   PT Stop Time 0352   PT Time Calculation (min) 42 min   Activity Tolerance Patient tolerated treatment well;No increased pain   Behavior During Therapy [facility] for tasks assessed/performed      Past Medical History  Diagnosis Date  . Asthma   . Chronic back pain   . Bilateral chronic knee pain   . Chronic shoulder pain   . Hypertension   . Reflux   . Depression   . Hx of echocardiogram     a. Echo 10/24/12:  EF 60-65%, normal wall thickness, normal diastolic fxn  . Hx of cardiovascular stress test     a. Lexiscan Myoview 10/29/12:  EF 56%, no ischemia  . Allergy   . Hyperlipidemia   . Diabetes mellitus without complication (HCC)     diagnosed in 2015  . Renal disorder     chronic kidney disease  . History of herniated intervertebral disc   . GERD (gastroesophageal reflux disease)     Past Surgical History  Procedure Laterality Date  . Esophagogastroduodenoscopy Left 10/24/2012    Procedure: ESOPHAGOGASTRODUODENOSCOPY (EGD);  Surgeon: Javier Modena, MD;  Location: [facility] ENDOSCOPY;  Service: Endoscopy;  Laterality: Left;  possible balloon dilation  . Balloon dilation N/A 10/24/2012    Procedure: BALLOON DILATION;  Surgeon: Javier Modena, MD;  Location: [facility] ENDOSCOPY;  Service: Endoscopy;  Laterality: N/A;  . Knee arthroscopy with excision plica  05/11/2015    Procedure: KNEE ARTHROSCOPY WITH EXCISION PLICA;  Surgeon: Javier Kos, MD;  Location: [facility];  Service: Orthopedics;;  . Synovectomy Right  05/11/2015    Procedure: SYNOVECTOMY;  Surgeon: Javier Kos, MD;  Location: [facility];  Service: Orthopedics;  Laterality: Right;  . Knee arthroscopy with lateral menisectomy  05/11/2015    Procedure: KNEE ARTHROSCOPY WITH LATERAL MENISECTOMY;  Surgeon: Javier Kos, MD;  Location: [facility];  Service: Orthopedics;;    There were no vitals filed for this visit.  Visit Diagnosis:  Abnormal posture  Cervical radicular pain  Stiffness of neck  Activity intolerance      Subjective Assessment - 11/15/15 1513    Subjective Less tingling than before. 10-15%   Currently in Pain? Yes   Pain Score 7    Pain Location Neck   Pain Orientation Right   Pain Descriptors / Indicators Aching   Pain Type Chronic pain   Pain Radiating Towards RT tricep   Pain Frequency Constant   Aggravating Factors  moving arm, activity   Pain Relieving Factors medication   Multiple Pain Sites No                         [facility] Adult PT Treatment/Exercise - 11/15/15 1514    Moist Heat Therapy   Number Minutes Moist Heat 12 Minutes   Moist Heat Location Cervical   Ultrasound   Ultrasound Location neck RT    Ultrasound Parameters 100% , 1  MHz, 1.5 Wcm2   Traction   Type of Traction Cervical   Min (lbs) 5   Max (lbs) 17   Hold Time 45   Rest Time 15   Time 12   Manual Therapy   Soft tissue mobilization assisted with Rockblade to RT neck.                   PT Short Term Goals - 11/15/15 1536    PT SHORT TERM GOAL #1   Title He will be independent with initial posture exercises and understand good posture.    Status On-going   PT SHORT TERM GOAL #2   Title He will have 40% decr RT arm symptoms  with normal activity at home   Status On-going   PT SHORT TERM GOAL #3   Title His cervical sidebending will improve to 40 degrees bilat withut incr arm symptoms   Status On-going           PT Long Term Goals - 11/03/15 1054    PT LONG TERM  GOAL #1   Title He will report pain /su\ymptoms into RT arm as intermittant and improved 75% or more with home tasks iincluding lifting trash.    Time 6   Period Weeks   Status New   PT LONG TERM GOAL #2   Title Cervical rotation improved to 70 degrees without RT arm symptoms /   Time 6   Period Weeks   Status New   PT LONG TERM GOAL #3   Title He will report ability to lok up and down with 1-2 max neck pain , no RT arm symptoms.    PT LONG TERM GOAL #4   Title He will report no pain with lifting pots and pans and typing   Time 6   Period Weeks   Status New               Plan - 11/15/15 1535    Clinical Impression Statement Subjectivly he is improved but pain reports are still high. Will measure next visit as he was late today   PT Next Visit Plan Modalities , traction to 19 pounds if 17 pounds benefitial.  and manual treatments. stab exercise, posible neural glides   Consulted and Agree with Plan of Care Patient        Problem List Patient Active Problem List   Diagnosis Date Noted  . Chest pain 08/26/2015  . Dizziness 08/04/2015  . Acute meniscal tear of right knee 04/29/2015  . Foot pain, left 01/20/2015  . Hemorrhoids 12/28/2014  . Essential hypertension 09/21/2014  . Diabetes mellitus with stage 2 chronic kidney disease (HCC) 07/05/2014  . Health care maintenance 07/05/2014  . Labral tear of shoulder 02/11/2014  . Pain in joint, shoulder region 12/28/2013  . HLD (hyperlipidemia) 11/03/2012  . CKD (chronic kidney disease) stage 2, GFR 60-89 ml/min 11/03/2012  . Esophagitis 10/24/2012  . Asthma 10/23/2012  . Allergic reaction 10/23/2012  . Chronic low back pain 11/12/2011  . Bilateral knee pain 11/12/2011    Javier Beasley PT 11/15/2015, 3:37 PM  [facility] [address] [place], [place], [address] Phone: [phone]   Fax:  [phone]  Name: Javier Beasley MRN: [id number] Date of Birth:  [birth year]

## 2015-11-16 ENCOUNTER — Encounter: Payer: Self-pay | Admitting: Dietician

## 2015-11-17 ENCOUNTER — Ambulatory Visit: Payer: Self-pay | Attending: Specialist

## 2015-11-17 DIAGNOSIS — R293 Abnormal posture: Secondary | ICD-10-CM | POA: Insufficient documentation

## 2015-11-17 DIAGNOSIS — M5412 Radiculopathy, cervical region: Secondary | ICD-10-CM | POA: Insufficient documentation

## 2015-11-17 DIAGNOSIS — R6889 Other general symptoms and signs: Secondary | ICD-10-CM | POA: Insufficient documentation

## 2015-11-17 DIAGNOSIS — M436 Torticollis: Secondary | ICD-10-CM | POA: Insufficient documentation

## 2015-11-17 NOTE — Therapy (Signed)
[facility] [address] [place], [place], [address] Phone: [phone]   Fax:  [phone]  Physical Therapy Treatment  Patient Details  Name: Javier Beasley MRN: [id number] Date of Birth: 06/09/1966 Referring Provider: Vira Browns, MD  Encounter Date: 11/17/2015      PT End of Session - 11/17/15 1532    Visit Number 4   Number of Visits 12   Date for PT Re-Evaluation 12/15/15   PT Start Time 0305   PT Stop Time 0357   PT Time Calculation (min) 52 min   Activity Tolerance Patient tolerated treatment well;No increased pain   Behavior During Therapy [facility] for tasks assessed/performed      Past Medical History  Diagnosis Date  . Asthma   . Chronic back pain   . Bilateral chronic knee pain   . Chronic shoulder pain   . Hypertension   . Reflux   . Depression   . Hx of echocardiogram     a. Echo 10/24/12:  EF 60-65%, normal wall thickness, normal diastolic fxn  . Hx of cardiovascular stress test     a. Lexiscan Myoview 10/29/12:  EF 56%, no ischemia  . Allergy   . Hyperlipidemia   . Diabetes mellitus without complication (HCC)     diagnosed in 2015  . Renal disorder     chronic kidney disease  . History of herniated intervertebral disc   . GERD (gastroesophageal reflux disease)     Past Surgical History  Procedure Laterality Date  . Esophagogastroduodenoscopy Left 10/24/2012    Procedure: ESOPHAGOGASTRODUODENOSCOPY (EGD);  Surgeon: Willis Modena, MD;  Location: [facility] ENDOSCOPY;  Service: Endoscopy;  Laterality: Left;  possible balloon dilation  . Balloon dilation N/A 10/24/2012    Procedure: BALLOON DILATION;  Surgeon: Willis Modena, MD;  Location: [facility]COPY;  Service: Endoscopy;  Laterality: N/A;  . Knee arthroscopy with excision plica  05/11/2015    Procedure: KNEE ARTHROSCOPY WITH EXCISION PLICA;  Surgeon: Tarry Kos, MD;  Location: [facility];  Service: Orthopedics;;  . Synovectomy Right 05/11/2015     Procedure: SYNOVECTOMY;  Surgeon: Tarry Kos, MD;  Location: [facility];  Service: Orthopedics;  Laterality: Right;  . Knee arthroscopy with lateral menisectomy  05/11/2015    Procedure: KNEE ARTHROSCOPY WITH LATERAL MENISECTOMY;  Surgeon: Tarry Kos, MD;  Location:  SURGERY CENTER;  Service: Orthopedics;;    There were no vitals filed for this visit.  Visit Diagnosis:  Abnormal posture  Cervical radicular pain  Stiffness of neck  Activity intolerance      Subjective Assessment - 11/17/15 1507    Subjective Less finger tingle. 10-15%. Now he has very mild tingle in hand and tingle worse with pressure to elbow and posterior shoulder.  Feel traction helping   Currently in Pain? Yes  C/O  LBP worse today   Pain Score 6    Pain Location Neck   Pain Orientation Right   Pain Descriptors / Indicators Aching            [facility] PT Assessment - 11/17/15 0001    AROM   Cervical - Right Side Bend 32   Cervical - Left Side Bend 34   Cervical - Right Rotation 65   Cervical - Left Rotation 68                     [facility] Adult PT Treatment/Exercise - 11/17/15 1511    Moist Heat  Therapy   Number Minutes Moist Heat 12 Minutes   Moist Heat Location Cervical   Ultrasound   Ultrasound Loc tion neck   Ultrasound Parameters 100% 1MHz, 1.5 Wcm2   Ultrasound Goals Pain   Traction   Type of Traction Cervical   Min (lbs) 5   Max (lbs) 18   Hold Time 45   Rest Time 15   Time 12   Manual Therapy   Soft tissue mobilization assisted with Rockblade to RT neck.                   PT Short Term Goals - 11/15/15 1536    PT SHORT TERM GOAL #1   Title He will be independent with initial posture exercises and understand good posture.    Status On-going   PT SHORT TERM GOAL #2   Title He will have 40% decr RT arm symptoms  with normal activity at home   Status On-going   PT SHORT TERM GOAL #3   Title His cervical sidebending will improve to 40  degrees bilat withut incr arm symptoms   Status On-going           PT Long Term Goals - 11/03/15 1054    PT LONG TERM GOAL #1   Title He will report pain /su\ymptoms into RT arm as intermittant and improved 75% or more with home tasks iincluding lifting trash.    Time 6   Period Weeks   Status New   PT LONG TERM GOAL #2   Title Cervical rotation improved to 70 degrees without RT arm symptoms /   Time 6   Period Weeks   Status New   PT LONG TERM GOAL #3   Title He will report ability to lok up and down with 1-2 max neck pain , no RT arm symptoms.    PT LONG TERM GOAL #4   Title He will report no pain with lifting pots and pans and typing   Time 6   Period Weeks   Status New               Plan - 11/17/15 1532    Clinical Impression Statement He continues to say he is better though same % improved. Range is still limited.    PT Next Visit Plan Modalities , traction to 19 pounds if 17 pounds benefitial.  and manual treatments. stab exercise, posible neural glides   Consulted and Agree with Plan of Care Patient        Problem List Patient Active Problem List   Diagnosis Date Noted  . Chest pain 08/26/2015  . Dizziness 08/04/2015  . Acute meniscal tear of right knee 04/29/2015  . Foot pain, left 01/20/2015  . Hemorrhoids 12/28/2014  . Essential hypertension 09/21/2014  . Diabetes mellitus with stage 2 chronic kidney disease (HCC) 07/05/2014  . Health care maintenance 07/05/2014  . Labral tear of shoulder 02/11/2014  . Pain in joint, shoulder region 12/28/2013  . HLD (hyperlipidemia) 11/03/2012  . CKD (chronic kidney disease) stage 2, GFR 60-89 ml/min 11/03/2012  . Esophagitis 10/24/2012  . Asthma 10/23/2012  . Allergic reaction 10/23/2012  . Chronic low back pain 11/12/2011  . Bilateral knee pain 11/12/2011    Caprice Red PT 11/17/2015, 3:34 PM  [facility] [address] [place], [place],  [address] Phone: [phone]   Fax:  [phone]  Name: Javier Beasley MRN: [id number] Date of Birth: 09-06-1966

## 2015-11-20 ENCOUNTER — Encounter: Payer: Self-pay | Admitting: Pulmonary Disease

## 2015-11-21 MED ORDER — MOMETASONE FURO-FORMOTEROL FUM 200-5 MCG/ACT IN AERO: 1.0000 | INHALATION_SPRAY | Freq: Two times a day (BID) | RESPIRATORY_TRACT | Status: DC

## 2015-11-21 MED ORDER — ALBUTEROL SULFATE HFA 108 (90 BASE) MCG/ACT IN AERS: 2.0000 | INHALATION_SPRAY | Freq: Four times a day (QID) | RESPIRATORY_TRACT | Status: DC | PRN

## 2015-11-21 NOTE — Addendum Note (Signed)
Addended by: Mliss FritzKIM, JENNIFER J on: 11/21/2015 09:18 AM   Modules accepted: Orders

## 2015-11-21 NOTE — Progress Notes (Signed)
[facility] and [facility]. [facility].

## 2015-11-22 ENCOUNTER — Ambulatory Visit: Payer: Self-pay

## 2015-11-22 DIAGNOSIS — R6889 Other general symptoms and signs: Secondary | ICD-10-CM

## 2015-11-22 DIAGNOSIS — M436 Torticollis: Secondary | ICD-10-CM

## 2015-11-22 DIAGNOSIS — R293 Abnormal posture: Secondary | ICD-10-CM

## 2015-11-22 DIAGNOSIS — M5412 Radiculopathy, cervical region: Secondary | ICD-10-CM

## 2015-11-22 NOTE — Patient Instructions (Signed)
Upper Limb Neural Tension: Radial III    Rotate right arm to direct thumb back and depress shoulder. To increase stretch, bend head away. Hold _1-30___ seconds. Repeat ___1-3_ times per set. Do ____ sets per session. Do __2__ sessions per day. For further stretch, move arm away from body.  http://orth.exer.us/412   Copyright  VHI. All rights reserved.  Upper Limb Neural Tension: Median III    Stand with left palm flat on wall, fingers back. Bend elbow, side-bend head away and Hold __10-30__ seconds. Then straighten elbow and Hold _10-30___ seconds. Repeat 2-3____ times per set. Do ____ sets per session. Do __2__ sessions per day.  http://orth.exer.us/406   Copyright  VHI. All rights reserved.  Upper Limb Neural Tension: Ulnar III    Bend right elbow and position fingers around eye, hand upside down. Pull elbow backward. Hold _10-30___ seconds. Repeat ____1-3 times per set. Do ____ sets per session. Do _2___ sessions per day.  http://orth.exer.us/418   Copyright  VHI. All rights reserved.  Upper Limb Neural Tension: Median II    Stand with left palm flat on wall, fingers up. Bend elbow, side-bend head away and Hold __10__ seconds. Straighten elbow and Hold _10-30___ seconds. Repeat ___2-3_ times per set. Do ____ sets per session. Do __2__ sessions per day.  http://orth.exer.us/404   Copyright  VHI. All rights reserved.  Upper Limb Neural Tension: Median I    Stand with left palm flat on wall, fingers up. Bend elbow ____ degrees and Hold __5__ seconds. Straighten elbow and Hold _10-30___ seconds. Repeat __2-3__ times per set. Do ____ sets per session. Do _2___ sessions per day.  http://orth.exer.us/402   Copyright  VHI. All rights reserved.

## 2015-11-22 NOTE — Therapy (Signed)
[facility] [address] [place], [place], [address] Phone: [phone]   Fax:  [phone]  Physical Therapy Treatment  Patient Details  Name: Javier Beasley MRN: [id number] Date of Birth: 09/02/1966 Referring Provider: Vira BrownsJames Nitka, MD  Encounter Date: 11/22/2015      PT End of Session - 11/22/15 1539    Visit Number 5   Number of Visits 12   Date for PT Re-Evaluation 12/15/15   PT Start Time 0300   PT Stop Time 0405   PT Time Calculation (min) 65 min   Activity Tolerance Patient tolerated treatment well   Behavior During Therapy [facility] for tasks assessed/performed      Past Medical History  Diagnosis Date  . Asthma   . Chronic back pain   . Bilateral chronic knee pain   . Chronic shoulder pain   . Hypertension   . Reflux   . Depression   . Hx of echocardiogram     a. Echo 10/24/12:  EF 60-65%, normal wall thickness, normal diastolic fxn  . Hx of cardiovascular stress test     a. Lexiscan Myoview 10/29/12:  EF 56%, no ischemia  . Allergy   . Hyperlipidemia   . Diabetes mellitus without complication (HCC)     diagnosed in 2015  . Renal disorder     chronic kidney disease  . History of herniated intervertebral disc   . GERD (gastroesophageal reflux disease)     Past Surgical History  Procedure Laterality Date  . Esophagogastroduodenoscopy Left 10/24/2012    Procedure: ESOPHAGOGASTRODUODENOSCOPY (EGD);  Surgeon: Willis ModenaWilliam Outlaw, MD;  [facility];  Service: Endoscopy;  Laterality: Left;  possible balloon dilation  . Balloon dilation N/A 10/24/2012    Procedure: BALLOON DILATION;  Surgeon: Willis ModenaWilliam Outlaw, MD;  [facility];  Service: Endoscopy;  Laterality: N/A;  . Knee arthroscopy with excision plica  05/11/2015    Procedure: KNEE ARTHROSCOPY WITH EXCISION PLICA;  Surgeon: Tarry KosNaiping M Xu, MD;  Location: [facility];  Service: Orthopedics;;  . Synovectomy Right 05/11/2015    Procedure:  SYNOVECTOMY;  Surgeon: Tarry KosNaiping M Xu, MD;  Location: [facility];  Service: Orthopedics;  Laterality: Right;  . Knee arthroscopy with lateral menisectomy  05/11/2015    Procedure: KNEE ARTHROSCOPY WITH LATERAL MENISECTOMY;  Surgeon: Tarry KosNaiping M Xu, MD;  Location: [facility];  Service: Orthopedics;;    There were no vitals filed for this visit.  Visit Diagnosis:  Abnormal posture  Cervical radicular pain  Stiffness of neck  Activity intolerance      Subjective Assessment - 11/22/15 1504    Subjective Same.A little crampy. Took flexeril but hasn't kicked in yet.  Tingle in RT arm lateral aspect , fingers less today.    Currently in Pain? Yes   Pain Score 7    Pain Location Neck   Pain Orientation Right   Pain Descriptors / Indicators Aching   Pain Type Chronic pain   Pain Onset More than a month ago   Pain Frequency Constant   Aggravating Factors  activity   Pain Relieving Factors medication   Multiple Pain Sites No  He ahs chronic LBP            [facility] PT Assessment - 11/22/15 0001    AROM   Cervical - Right Side Bend 30   Cervical - Left Side Bend 31   Cervical - Right Rotation 67   Cervical - Left Rotation 62  [facility] Adult PT Treatment/Exercise - 11/22/15 0001    Moist Heat Therapy   Number Minutes Moist Heat 12 Minutes   Moist Heat Location Cervical   Ultrasound   Ultrasound Location neck   Ultrasound Parameters 100%, , 1.5Wcm2   Ultrasound Goals Pain   Traction   Type of Traction Cervical   Min (lbs) 5   Max (lbs) 20   Hold Time 45   Rest Time 15   Time 12   Manual Therapy   Manual Therapy Soft tissue mobilization;Passive ROM;Manual Traction   Soft tissue mobilization assisted with Rockblade to RT neck.                 PT Education - 11/22/15 1514    Education provided Yes   Education Details neural stretches UE   Person(s) Educated Patient   Methods  Explanation;Demonstration;Handout;Verbal cues;Tactile cues   Comprehension Verbalized understanding;Returned demonstration          PT Short Term Goals - 11/22/15 1541    PT SHORT TERM GOAL #1   Title He will be independent with initial posture exercises and understand good posture.    Status On-going   PT SHORT TERM GOAL #2   Title He will have 40% decr RT arm symptoms  with normal activity at home   Status On-going   PT SHORT TERM GOAL #3   Title His cervical sidebending will improve to 40 degrees bilat withut incr arm symptoms   Status On-going           PT Long Term Goals - 11/03/15 1054    PT LONG TERM GOAL #1   Title He will report pain /su\ymptoms into RT arm as intermittant and improved 75% or more with home tasks iincluding lifting trash.    Time 6   Period Weeks   Status New   PT LONG TERM GOAL #2   Title Cervical rotation improved to 70 degrees without RT arm symptoms /   Time 6   Period Weeks   Status New   PT LONG TERM GOAL #3   Title He will report ability to lok up and down with 1-2 max neck pain , no RT arm symptoms.    PT LONG TERM GOAL #4   Title He will report no pain with lifting pots and pans and typing   Time 6   Period Weeks   Status New               Plan - 11/22/15 1539    Clinical Impression Statement We discussed lack of progress though he reports some small gains. He will try neural glides over next week and we will treat and if not really better will discharge next week to MD   PT Next Visit Plan Modalities , traction to 22 pounds if 20 pounds beneficial.  and manual treatments. stab exercise,neural glides review   Consulted and Agree with Plan of Care Patient        Problem List Patient Active Problem List   Diagnosis Date Noted  . Chest pain 08/26/2015  . Dizziness 08/04/2015  . Acute meniscal tear of right knee 04/29/2015  . Foot pain, left 01/20/2015  . Hemorrhoids 12/28/2014  . Essential hypertension 09/21/2014  .  Diabetes mellitus with stage 2 chronic kidney disease (HCC) 07/05/2014  . Health care maintenance 07/05/2014  . Labral tear of shoulder 02/11/2014  . Pain in joint, shoulder region 12/28/2013  . HLD (hyperlipidemia) 11/03/2012  . CKD (chronic  kidney disease) stage 2, GFR 60-89 ml/min 11/03/2012  . Esophagitis 10/24/2012  . Asthma 10/23/2012  . Allergic reaction 10/23/2012  . Chronic low back pain 11/12/2011  . Bilateral knee pain 11/12/2011    Caprice Red PT 11/22/2015, 3:42 PM  [facility] [address] [place], [place], [address] Phone: [phone]   Fax:  [phone]  Name: Javier Beasley MRN: [id number] Date of Birth: [birth year]

## 2015-11-24 ENCOUNTER — Ambulatory Visit: Payer: Self-pay | Admitting: Physical Therapy

## 2015-11-24 DIAGNOSIS — M5412 Radiculopathy, cervical region: Secondary | ICD-10-CM

## 2015-11-24 DIAGNOSIS — R6889 Other general symptoms and signs: Secondary | ICD-10-CM

## 2015-11-24 DIAGNOSIS — R293 Abnormal posture: Secondary | ICD-10-CM

## 2015-11-24 DIAGNOSIS — M436 Torticollis: Secondary | ICD-10-CM

## 2015-11-24 NOTE — Therapy (Signed)
[facility] [address] [place], [place], [address] Phone: [phone]   Fax:  [phone]  Physical Therapy Treatment  Patient Details  Name: Javier Beasley MRN: [id number] Date of Birth: 10/22/1965 Referring Provider: Vira BrownsJames Nitka, MD  Encounter Date: 11/24/2015      PT End of Session - 11/24/15 1008    Visit Number 6   Number of Visits 12   Date for PT Re-Evaluation 12/15/15   PT Start Time 0930   PT Stop Time 1027   PT Time Calculation (min) 57 min   Activity Tolerance Patient tolerated treatment well   Behavior During Therapy [facility] for tasks assessed/performed      Past Medical History  Diagnosis Date  . Asthma   . Chronic back pain   . Bilateral chronic knee pain   . Chronic shoulder pain   . Hypertension   . Reflux   . Depression   . Hx of echocardiogram     a. Echo 10/24/12:  EF 60-65%, normal wall thickness, normal diastolic fxn  . Hx of cardiovascular stress test     a. Lexiscan Myoview 10/29/12:  EF 56%, no ischemia  . Allergy   . Hyperlipidemia   . Diabetes mellitus without complication (HCC)     diagnosed in 2015  . Renal disorder     chronic kidney disease  . History of herniated intervertebral disc   . GERD (gastroesophageal reflux disease)     Past Surgical History  Procedure Laterality Date  . Esophagogastroduodenoscopy Left 10/24/2012    Procedure: ESOPHAGOGASTRODUODENOSCOPY (EGD);  Surgeon: Willis ModenaWilliam Outlaw, MD;  [facility];  Service: Endoscopy;  Laterality: Left;  possible balloon dilation  . Balloon dilation N/A 10/24/2012    Procedure: BALLOON DILATION;  Surgeon: Willis ModenaWilliam Outlaw, MD;  [facility];  Service: Endoscopy;  Laterality: N/A;  . Knee arthroscopy with excision plica  05/11/2015    Procedure: KNEE ARTHROSCOPY WITH EXCISION PLICA;  Surgeon: Tarry KosNaiping M Xu, MD;  Location: [facility];  Service: Orthopedics;;  . Synovectomy Right 05/11/2015    Procedure:  SYNOVECTOMY;  Surgeon: Tarry KosNaiping M Xu, MD;  Location: [facility];  Service: Orthopedics;  Laterality: Right;  . Knee arthroscopy with lateral menisectomy  05/11/2015    Procedure: KNEE ARTHROSCOPY WITH LATERAL MENISECTOMY;  Surgeon: Tarry KosNaiping M Xu, MD;  Location: [facility];  Service: Orthopedics;;    There were no vitals filed for this visit.  Visit Diagnosis:  Abnormal posture  Cervical radicular pain  Stiffness of neck  Activity intolerance      Subjective Assessment - 11/24/15 0936    Subjective "still feeling tingly down the R UE with numbness in the 1st, 2nd, and 3rd digits" feels traction helps but is more of a band aid.    Currently in Pain? No/denies                         [facility] Adult PT Treatment/Exercise - 11/24/15 0001    Self-Care   Self-Care Other Self-Care Comments   Other Self-Care Comments  manual trigger point release of the upper traps to reliev tension on the neck and surrounding musculature, and how to perform it.    Traction   Type of Traction Cervical   Min (lbs) 11   Max (lbs) 22   Hold Time 45   Rest Time 15   Time 15   Manual Therapy   Manual Therapy Myofascial release;Soft  tissue mobilization   Soft tissue mobilization IASTM over bil upper traps  reported relief of tingling in the hand.   Myofascial Release manual trigger point release of bil upper traps                  PT Education - 11/24/15 1014    Education provided Yes   Education Details manual trigger point release   Person(s) Educated Patient   Methods Explanation          PT Short Term Goals - 11/24/15 1012    PT SHORT TERM GOAL #1   Title He will be independent with initial posture exercises and understand good posture.    Time 3   Period Weeks   Status On-going   PT SHORT TERM GOAL #2   Title He will have 40% decr RT arm symptoms  with normal activity at home   Time 3   Period Weeks   Status On-going   PT SHORT TERM  GOAL #3   Title His cervical sidebending will improve to 40 degrees bilat withut incr arm symptoms   Time 3   Period Weeks   Status On-going           PT Long Term Goals - 11/24/15 1012    PT LONG TERM GOAL #1   Title He will report pain /su\ymptoms into RT arm as intermittant and improved 75% or more with home tasks iincluding lifting trash.    Time 6   Period Weeks   Status On-going   PT LONG TERM GOAL #2   Title Cervical rotation improved to 70 degrees without RT arm symptoms /   Time 6   Period Weeks   Status On-going   PT LONG TERM GOAL #3   Title He will report ability to lok up and down with 1-2 max neck pain , no RT arm symptoms.    Time 6   Period Weeks   Status On-going   PT LONG TERM GOAL #4   Title He will report no pain with lifting pots and pans and typing   Time 6   Period Weeks   Status On-going               Plan - 11/24/15 1008    Clinical Impression Statement Jacquese reports that the traction helps but doesn't seem to last with relief in the neck/ arm. Focused todays session on manual to calm down tightness in bil upper traps with manual trigger point release and IASTM which he reported relief of tingling in the R UE/ hand. Attempted traction again with pt placed into more cervical flexion.    PT Next Visit Plan assess response to traction and manual trigger point release, attempt trial of DN, modalities PRN   Consulted and Agree with Plan of Care Patient        Problem List Patient Active Problem List   Diagnosis Date Noted  . Chest pain 08/26/2015  . Dizziness 08/04/2015  . Acute meniscal tear of right knee 04/29/2015  . Foot pain, left 01/20/2015  . Hemorrhoids 12/28/2014  . Essential hypertension 09/21/2014  . Diabetes mellitus with stage 2 chronic kidney disease (HCC) 07/05/2014  . Health care maintenance 07/05/2014  . Labral tear of shoulder 02/11/2014  . Pain in joint, shoulder region 12/28/2013  . HLD (hyperlipidemia)  11/03/2012  . CKD (chronic kidney disease) stage 2, GFR 60-89 ml/min 11/03/2012  . Esophagitis 10/24/2012  . Asthma 10/23/2012  . Allergic reaction  10/23/2012  . Chronic low back pain 11/12/2011  . Bilateral knee pain 11/12/2011   Lulu Riding PT, DPT, LAT, ATC  11/24/2015  10:16 AM      [facility] [address] [place], [place], [address] Phone: [phone]   Fax:  [phone]  Name: LISLE SKILLMAN MRN: [id number] Date of Birth: 24-Oct-1965

## 2015-11-29 ENCOUNTER — Ambulatory Visit: Payer: Self-pay

## 2015-12-01 ENCOUNTER — Ambulatory Visit: Payer: Self-pay | Admitting: Physical Therapy

## 2015-12-01 DIAGNOSIS — R293 Abnormal posture: Secondary | ICD-10-CM

## 2015-12-01 DIAGNOSIS — M5412 Radiculopathy, cervical region: Secondary | ICD-10-CM

## 2015-12-01 DIAGNOSIS — R6889 Other general symptoms and signs: Secondary | ICD-10-CM

## 2015-12-01 DIAGNOSIS — M436 Torticollis: Secondary | ICD-10-CM

## 2015-12-01 NOTE — Therapy (Signed)
[facility] [address] [place], [place], [address] Phone: [phone]   Fax:  [phone]  Physical Therapy Treatment  Patient Details  Name: Javier Beasley MRN: [id number] Date of Birth: [birth year] Referring Provider: Vira Browns, MD  Encounter Date: 12/01/2015      PT End of Session - 12/01/15 1532    Visit Number 7   Number of Visits 12   Date for PT Re-Evaluation 12/15/15   PT Start Time 1508   PT Stop Time 1605   PT Time Calculation (min) 57 min   Activity Tolerance Patient tolerated treatment well   Behavior During Therapy [facility] for tasks assessed/performed      Past Medical History  Diagnosis Date  . Asthma   . Chronic back pain   . Bilateral chronic knee pain   . Chronic shoulder pain   . Hypertension   . Reflux   . Depression   . Hx of echocardiogram     a. Echo 10/24/12:  EF 60-65%, normal wall thickness, normal diastolic fxn  . Hx of cardiovascular stress test     a. Lexiscan Myoview 10/29/12:  EF 56%, no ischemia  . Allergy   . Hyperlipidemia   . Diabetes mellitus without complication (HCC)     diagnosed in 2015  . Renal disorder     chronic kidney disease  . History of herniated intervertebral disc   . GERD (gastroesophageal reflux disease)     Past Surgical History  Procedure Laterality Date  . Esophagogastroduodenoscopy Left 10/24/2012    Procedure: ESOPHAGOGASTRODUODENOSCOPY (EGD);  Surgeon: Willis Modena, MD;  Location: [facility] ENDOSCOPY;  Service: Endoscopy;  Laterality: Left;  possible balloon dilation  . Balloon dilation N/A 10/24/2012    Procedure: BALLOON DILATION;  Surgeon: Willis Modena, MD;  Location: [facility] ENDOSCOPY;  Service: Endoscopy;  Laterality: N/A;  . Knee arthroscopy with excision plica  05/11/2015    Procedure: KNEE ARTHROSCOPY WITH EXCISION PLICA;  Surgeon: Tarry Kos, MD;  Location: [facility];  Service: Orthopedics;;  . Synovectomy Right 05/11/2015    Procedure:  SYNOVECTOMY;  Surgeon: Tarry Kos, MD;  Location: [facility];  Service: Orthopedics;  Laterality: Right;  . Knee arthroscopy with lateral menisectomy  05/11/2015    Procedure: KNEE ARTHROSCOPY WITH LATERAL MENISECTOMY;  Surgeon: Tarry Kos, MD;  Location: [facility];  Service: Orthopedics;;    There were no vitals filed for this visit.  Visit Diagnosis:  Abnormal posture  Cervical radicular pain  Stiffness of neck  Activity intolerance      Subjective Assessment - 12/01/15 1513    Subjective " I am doing pretty good, just feeling stiff with some tingling in the R arm earlier, and tightness in the L shoulder"  pt reported increased pull last traction and increased relief in the R hand with compared to previous session.    Currently in Pain? Yes   Pain Score 6    Pain Location Neck   Pain Orientation Right   Pain Descriptors / Indicators Aching  heavy   Pain Type Chronic pain   Pain Radiating Towards Rt arm                         [facility] Adult PT Treatment/Exercise - 12/01/15 1537    Neck Exercises: Supine   Neck Retraction 15 reps  with towel behind top of head   Moist Heat Therapy   Number Minutes  Moist Heat 10 Minutes   Moist Heat Location --  L upper trap   Electrical Stimulation   Electrical Stimulation Location L upper trap   Electrical Stimulation Action IFC   Electrical Stimulation Parameters scan 100%, L 15, x 10   Electrical Stimulation Goals Pain   Manual Therapy   Manual Therapy Joint mobilization   Joint Mobilization grade 2-3 throacic T1-T7 P>A mobs   Soft tissue mobilization IASTM over L upper trap   following TPDN   Myofascial Release rolling and stretching of the L upper trap and levator scap region          Trigger Point Dry Needling - 12/01/15 1519    Consent Given? Yes   Education Handout Provided Yes   Muscles Treated Upper Body Upper trapezius   Upper Trapezius Response Twitch reponse  elicited;Palpable increased muscle length  L upper trap              PT Education - 12/01/15 1554    Education provided Yes   Education Details dry needling education   Person(s) Educated Patient   Methods Explanation   Comprehension Verbalized understanding          PT Short Term Goals - 11/24/15 1012    PT SHORT TERM GOAL #1   Title He will be independent with initial posture exercises and understand good posture.    Time 3   Period Weeks   Status On-going   PT SHORT TERM GOAL #2   Title He will have 40% decr RT arm symptoms  with normal activity at home   Time 3   Period Weeks   Status On-going   PT SHORT TERM GOAL #3   Title His cervical sidebending will improve to 40 degrees bilat withut incr arm symptoms   Time 3   Period Weeks   Status On-going           PT Long Term Goals - 11/24/15 1012    PT LONG TERM GOAL #1   Title He will report pain /su\ymptoms into RT arm as intermittant and improved 75% or more with home tasks iincluding lifting trash.    Time 6   Period Weeks   Status On-going   PT LONG TERM GOAL #2   Title Cervical rotation improved to 70 degrees without RT arm symptoms /   Time 6   Period Weeks   Status On-going   PT LONG TERM GOAL #3   Title He will report ability to lok up and down with 1-2 max neck pain , no RT arm symptoms.    Time 6   Period Weeks   Status On-going   PT LONG TERM GOAL #4   Title He will report no pain with lifting pots and pans and typing   Time 6   Period Weeks   Status On-going               Plan - 12/01/15 1556    Clinical Impression Statement Javier Beasley reported relief last session with the cervical traction with lowering the table to increase the cervical flexion stating it lasted for a couple of days. pt provided consent for TPDN of the L upper trap which elicited multiple twitches and he reported relief of tension in the muscle following IASTM and myaofascial relseae/ thoracic mobs. altered pt  prone position to pillows beneath his chest to having in chin tuck which helped with decreasing the tinglig in the R Arm. utlized E-stim and heat following  today session to decrease soreness from todays treatment.    PT Next Visit Plan assess response to DN, and manual trigger point release, attempt trial of DN, modalities PRN   Consulted and Agree with Plan of Care Patient        Problem List Patient Active Problem List   Diagnosis Date Noted  . Chest pain 08/26/2015  . Dizziness 08/04/2015  . Acute meniscal tear of right knee 04/29/2015  . Foot pain, left 01/20/2015  . Hemorrhoids 12/28/2014  . Essential hypertension 09/21/2014  . Diabetes mellitus with stage 2 chronic kidney disease (HCC) 07/05/2014  . Health care maintenance 07/05/2014  . Labral tear of shoulder 02/11/2014  . Pain in joint, shoulder region 12/28/2013  . HLD (hyperlipidemia) 11/03/2012  . CKD (chronic kidney disease) stage 2, GFR 60-89 ml/min 11/03/2012  . Esophagitis 10/24/2012  . Asthma 10/23/2012  . Allergic reaction 10/23/2012  . Chronic low back pain 11/12/2011  . Bilateral knee pain 11/12/2011   Lulu Riding PT, DPT, LAT, ATC  12/01/2015  4:05 PM      [facility] [address] [place], [place], [address] Phone: [phone]   Fax:  [phone]  Name: Javier Beasley MRN: [id number] Date of Birth: [birth year]

## 2015-12-02 ENCOUNTER — Other Ambulatory Visit: Payer: Self-pay | Admitting: Pulmonary Disease

## 2015-12-02 DIAGNOSIS — Z0000 Encounter for general adult medical examination without abnormal findings: Secondary | ICD-10-CM

## 2015-12-08 ENCOUNTER — Ambulatory Visit: Payer: Self-pay

## 2015-12-08 ENCOUNTER — Encounter: Payer: Self-pay | Admitting: Pulmonary Disease

## 2015-12-08 MED FILL — VENTOLIN HFA 90 MCG INHALER: 108 (90 BAS | 25 days supply | Qty: 18 | Fill #0

## 2015-12-08 NOTE — Telephone Encounter (Signed)
Spoke to [employer] Spoke to pt He states he "thought i had everything done for MAP but now theyre saying i dont" Spoke to [facility], they will give him 1 inhaler of ventolin, i will inform him to try to hurry and get his MAP certification

## 2015-12-21 NOTE — Addendum Note (Signed)
Addended by: Bufford SpikesFULCHER, Beauden Tremont N on: 12/21/2015 11:51 AM   Modules accepted: Orders

## 2015-12-22 ENCOUNTER — Ambulatory Visit: Payer: Self-pay

## 2015-12-22 ENCOUNTER — Ambulatory Visit: Payer: Self-pay | Attending: Specialist

## 2015-12-22 DIAGNOSIS — R293 Abnormal posture: Secondary | ICD-10-CM | POA: Insufficient documentation

## 2015-12-22 DIAGNOSIS — M542 Cervicalgia: Secondary | ICD-10-CM | POA: Insufficient documentation

## 2015-12-22 NOTE — Therapy (Addendum)
[place], [place], [address] Phone: [phone]   Fax:  [phone]  Physical Therapy Treatment  Patient Details  Name: Javier Beasley MRN: [id number] Date of Birth: 1965-12-23 Referring Provider: Basil Dess, MD  Encounter Date: 12/22/2015      PT End of Session - 12/22/15 1144    Visit Number 8   Number of Visits 12   Date for PT Re-Evaluation 12/15/15   PT Start Time 1105   PT Stop Time 1143   PT Time Calculation (min) 38 min   Activity Tolerance Patient tolerated treatment well   Behavior During Therapy Javier Beasley for tasks assessed/performed      Past Medical History  Diagnosis Date  . Asthma   . Chronic back pain   . Bilateral chronic knee pain   . Chronic shoulder pain   . Hypertension   . Reflux   . Depression   . Hx of echocardiogram     a. Echo 10/24/12:  EF 60-65%, normal wall thickness, normal diastolic fxn  . Hx of cardiovascular stress test     a. Lexiscan Myoview 10/29/12:  EF 56%, no ischemia  . Allergy   . Hyperlipidemia   . Diabetes mellitus without complication ([place])     diagnosed in 2015  . Renal disorder     chronic kidney disease  . History of herniated intervertebral disc   . GERD (gastroesophageal reflux disease)     Past Surgical History  Procedure Laterality Date  . Esophagogastroduodenoscopy Left 10/24/2012    Procedure: ESOPHAGOGASTRODUODENOSCOPY (EGD);  Surgeon: Arta Silence, MD;  Location: [facility];  Service: Endoscopy;  Laterality: Left;  possible balloon dilation  . Balloon dilation N/A 10/24/2012    Procedure: BALLOON DILATION;  Surgeon: Arta Silence, MD;  Location: [facility];  Service: Endoscopy;  Laterality: N/A;  . Knee arthroscopy with excision plica  10/21/5595    Procedure: KNEE ARTHROSCOPY WITH EXCISION PLICA;  Surgeon: Leandrew Koyanagi, MD;  Location: [place];  Service: Orthopedics;;  . Synovectomy Right 05/11/2015    Procedure:  SYNOVECTOMY;  Surgeon: Leandrew Koyanagi, MD;  Location: [place];  Service: Orthopedics;  Laterality: Right;  . Knee arthroscopy with lateral menisectomy  05/11/2015    Procedure: KNEE ARTHROSCOPY WITH LATERAL MENISECTOMY;  Surgeon: Leandrew Koyanagi, MD;  Location: [place];  Service: Orthopedics;;    Filed Vitals:   12/22/15 1107  BP: 152/90  Pulse: 65    Visit Diagnosis:  Cervicalgia  Abnormal posture      Subjective Assessment - 12/22/15 1107    Subjective He reports stiff neck less arm symptoms and now getting LT arm symptoms and gets dizzy at times with quick movments. He tried massage and with rubbing he felt like ants were on arms and muscle tension.  He will call  for  primary care appointment and will see Dr Javier Beasley next week.    Currently in Pain? Yes   Pain Score 7             [facility] PT Assessment - 12/22/15 1116    AROM   Cervical Flexion 45   Cervical Extension 50   Cervical - Right Side Bend 30   Cervical - Left Side Bend 27   Cervical - Right Rotation 70   Cervical - Left Rotation 65   Strength   Overall Strength Comments All UE motions 5/5  [facility] Adult PT Treatment/Exercise - 12/22/15 1126    Manual Therapy   Passive ROM Rotation and sidebending stretching iin supine with retraction and manual traction. He only had mild hand symptoms with this and no reports of dizziness even with cervical extension. He did have som neck pain and refered pain to RT traps. Affter sitting from this he reported ach in upper arms and some dizziness that resolved afte 30-60 sec. Also reporte some back pain.                 PT Education - 12/22/15 1143    Education provided Yes   Education Details informed him as to his high blood pressure  and possible consequences with poor control   Person(s) Educated Patient   Methods Explanation   Comprehension Verbalized understanding          PT Short Term Goals -  12/22/15 1151    PT SHORT TERM GOAL #1   Title He will be independent with initial posture exercises and understand good posture.    Baseline Able to verbalize   Status Achieved   PT SHORT TERM GOAL #2   Title He will have 40% decr RT arm symptoms  with normal activity at home   Status Partially Met   PT SHORT TERM GOAL #3   Title His cervical sidebending will improve to 40 degrees bilat withut incr arm symptoms   Status Not Met           PT Long Term Goals - 12/22/15 1151    PT LONG TERM GOAL #1   Title He will report pain /su\ymptoms into RT arm as intermittant and improved 75% or more with home tasks iincluding lifting trash.    Status On-going   PT LONG TERM GOAL #2   Title Cervical rotation improved to 70 degrees without RT arm symptoms /   Status Achieved   PT LONG TERM GOAL #3   Title He will report ability to look up and down with 1-2 max neck pain , no RT arm symptoms.    Status On-going   PT LONG TERM GOAL #4   Title He will report no pain with lifting pots and pans and typing   Status On-going               Plan - 12/22/15 1145    Clinical Impression Statement [name] reported new symptoms of dizziness , occasional blurred vision and new parasthesias into LT arm and tighness incresed around upper arms . I checked his BP and this was 152/90 today.   When I took His BP he reported increased parsthesias into LT arm  AND into RT arm.   This subsided after some time. I moved and stretched his Cervical spine to see if I could eleicite symptoms into arms or lightheadedness but was not able . He had arm symptoms after sitting up from supine but not when supine.   As he painwise no better and  is reporting new symptoms I felt it best to be assessed by Dr Javier Beasley and his primary care MD before continuing. If he is cleared we will see him for 5-6 sessions and if pain level not better will discharge then or if he reports he will not return.    PT Next Visit Plan Hold until  after MD visits   Consulted and Agree with Plan of Care Patient        Problem List Patient Active Problem List  Diagnosis Date Noted  . Chest pain 08/26/2015  . Dizziness 08/04/2015  . Acute meniscal tear of right knee 04/29/2015  . Foot pain, left 01/20/2015  . Hemorrhoids 12/28/2014  . Essential hypertension 09/21/2014  . Diabetes mellitus with stage 2 chronic kidney disease ([place]) 07/05/2014  . Health care maintenance 07/05/2014  . Labral tear of shoulder 02/11/2014  . Pain in joint, shoulder region 12/28/2013  . HLD (hyperlipidemia) 11/03/2012  . CKD (chronic kidney disease) stage 2, GFR 60-89 ml/min 11/03/2012  . Esophagitis 10/24/2012  . Asthma 10/23/2012  . Allergic reaction 10/23/2012  . Chronic low back pain 11/12/2011  . Bilateral knee pain 11/12/2011    Darrel Hoover PT 12/22/2015, 11:54 AM  [facility] [address] [place], [place], [address] Phone: [phone]   Fax:  [phone]  Name: Javier Beasley MRN: [id number] Date of Birth: 10-31-1965    PHYSICAL THERAPY DISCHARGE SUMMARY  Visits from Start of Care: 8  Current functional level related to goals / functional outcomes: See above  Remaining deficits: Unknown as he did not return   Education / Equipment: HEP Plan:                                                    Patient goals were partially met. Patient is being discharged due to not returning since the last visit.  ?????   Darrel Hoover, PT                01/26/16                        4:12 PM

## 2015-12-26 ENCOUNTER — Encounter: Payer: Self-pay | Admitting: Pulmonary Disease

## 2015-12-27 ENCOUNTER — Encounter: Payer: Self-pay | Admitting: Physical Therapy

## 2015-12-27 ENCOUNTER — Other Ambulatory Visit: Payer: Self-pay | Admitting: *Deleted

## 2015-12-27 DIAGNOSIS — E785 Hyperlipidemia, unspecified: Secondary | ICD-10-CM

## 2015-12-27 DIAGNOSIS — N182 Chronic kidney disease, stage 2 (mild): Secondary | ICD-10-CM

## 2015-12-27 DIAGNOSIS — E1122 Type 2 diabetes mellitus with diabetic chronic kidney disease: Secondary | ICD-10-CM

## 2015-12-27 DIAGNOSIS — I10 Essential (primary) hypertension: Secondary | ICD-10-CM

## 2015-12-27 MED ORDER — METFORMIN HCL ER 500 MG PO TB24: 500.0000 mg | ORAL_TABLET | Freq: Every day | ORAL | Status: DC

## 2015-12-27 MED ORDER — ATORVASTATIN CALCIUM 40 MG PO TABS: 80.0000 mg | ORAL_TABLET | Freq: Every day | ORAL | Status: DC

## 2015-12-27 MED ORDER — LISINOPRIL 5 MG PO TABS: 5.0000 mg | ORAL_TABLET | Freq: Every day | ORAL | Status: DC

## 2015-12-28 ENCOUNTER — Ambulatory Visit: Payer: Self-pay | Admitting: Internal Medicine

## 2015-12-28 ENCOUNTER — Telehealth: Payer: Self-pay | Admitting: Pulmonary Disease

## 2015-12-28 NOTE — Telephone Encounter (Signed)
Pt states he needs to have appt mon due to transportation

## 2015-12-28 NOTE — Telephone Encounter (Signed)
Needs to speak to nurse °

## 2016-01-05 ENCOUNTER — Encounter: Payer: Self-pay | Admitting: Internal Medicine

## 2016-01-09 IMAGING — MR MR KNEE*R* W/O CM
4 of 6 series · 19 of 40 positions shown · non-contrast
Comparison: MRI 12/16/2012.

CLINICAL DATA: Acute RIGHT knee pain. Onset of symptoms March 04, 2015. Acute on chronic knee pain. Acute exercise injury February 2015.

EXAM:
MRI OF THE RIGHT KNEE WITHOUT CONTRAST
TECHNIQUE: Multiplanar, multisequence MR imaging of the knee was performed. No
intravenous contrast was administered.

[Series 3: PD fat-sat · axial · 4.0mm · 0.31mm/px · z∈[-71,+49]mm · 9 of 27 slices shown (1 of 4)]
[im 1/27]
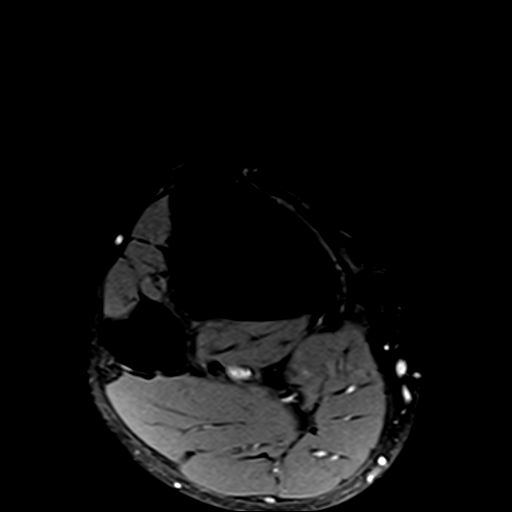
[im 4/27]
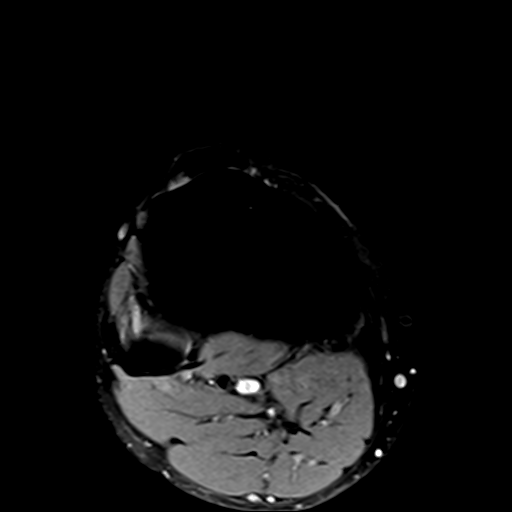
[im 7/27]
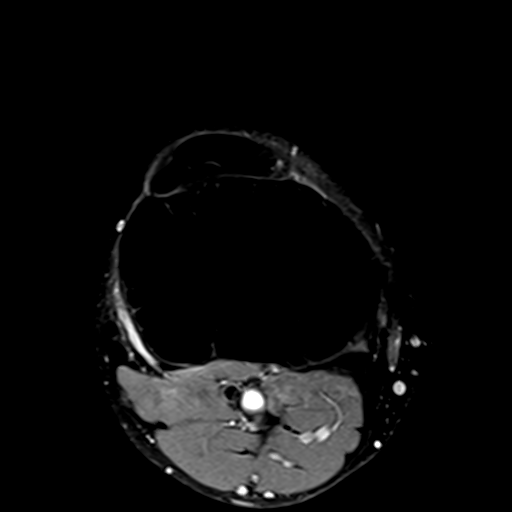
[im 10/27]
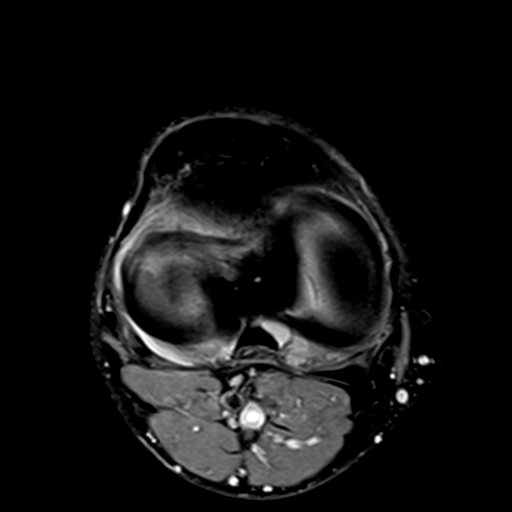
[im 14/27]
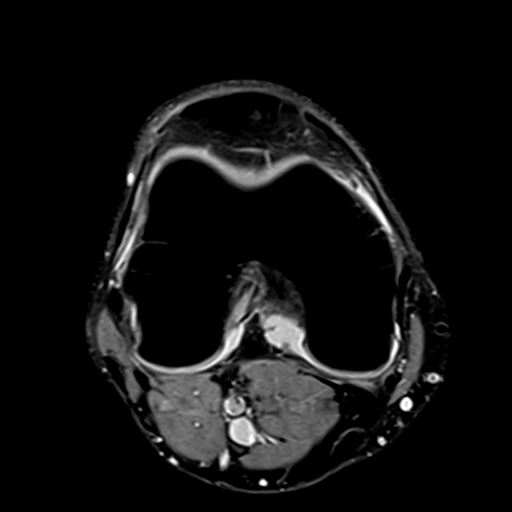
[im 17/27]
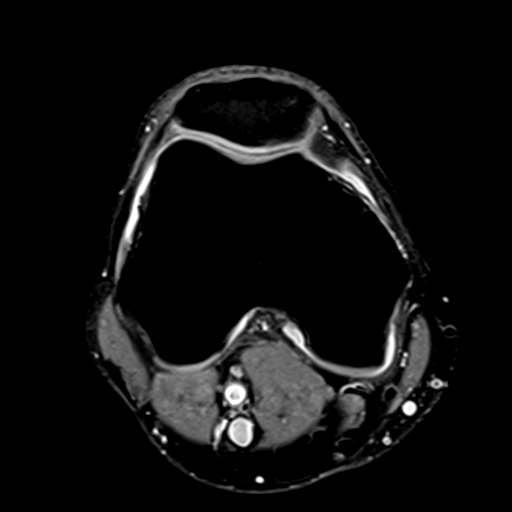
[im 20/27]
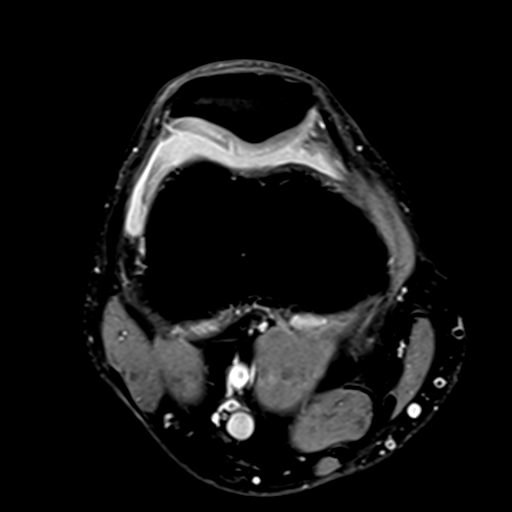
[im 23/27]
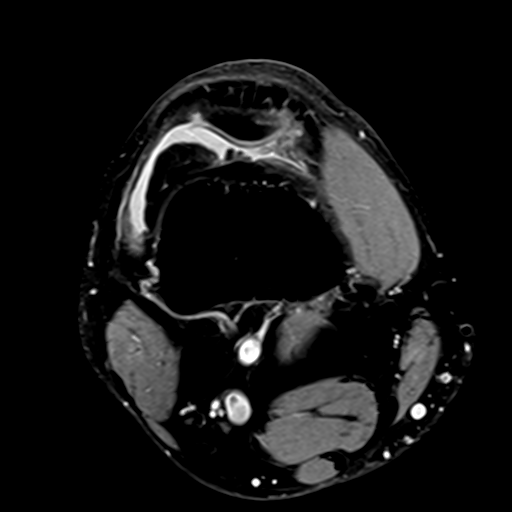
[im 27/27]
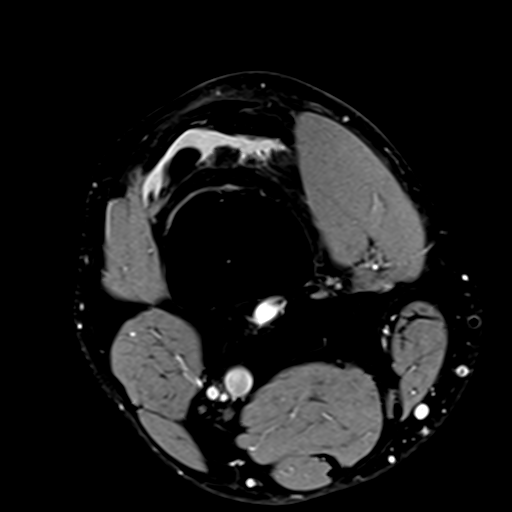

[Series 5: PD fat-sat · sagittal · 3.5mm · 0.31mm/px · 4 of 26 slices shown (2 of 4)]
[im 1/26]
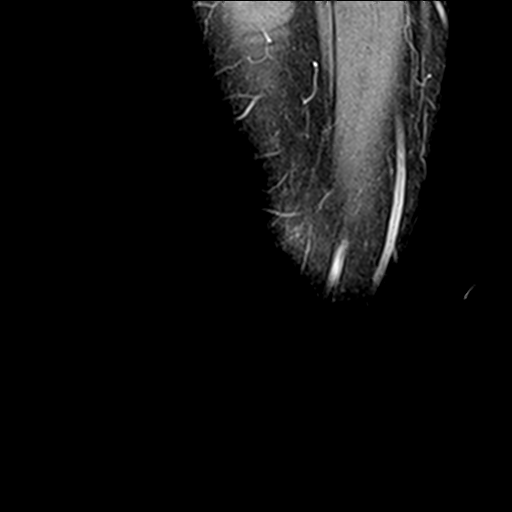
[im 5/26]
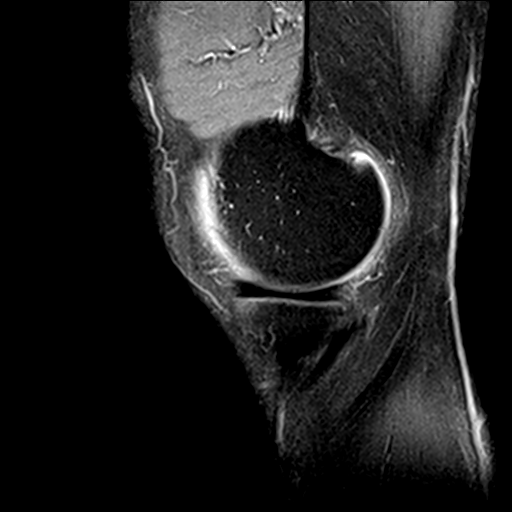
[im 13/26]
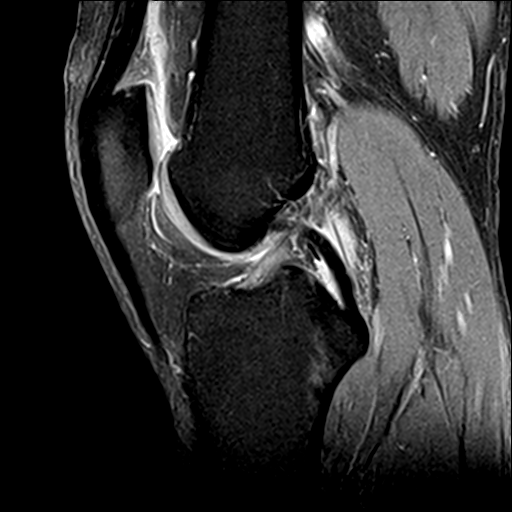
[im 21/26]
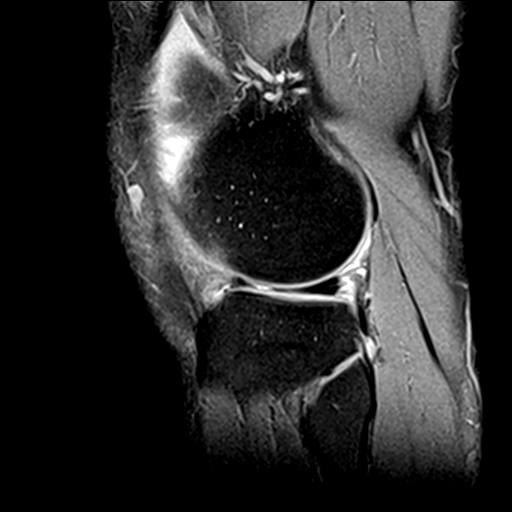

[Series 7: PD fat-sat · coronal · 3.5mm · 0.31mm/px · 3 of 25 slices shown (3 of 4)]
[im 5/25]
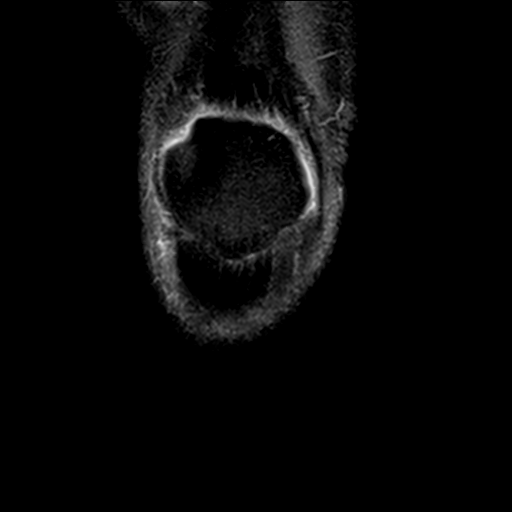
[im 13/25]
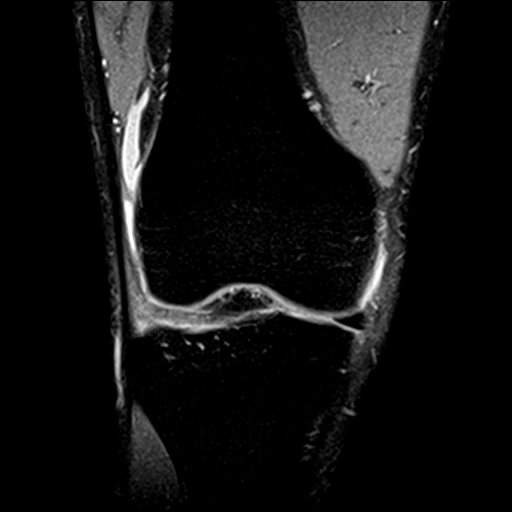
[im 21/25]
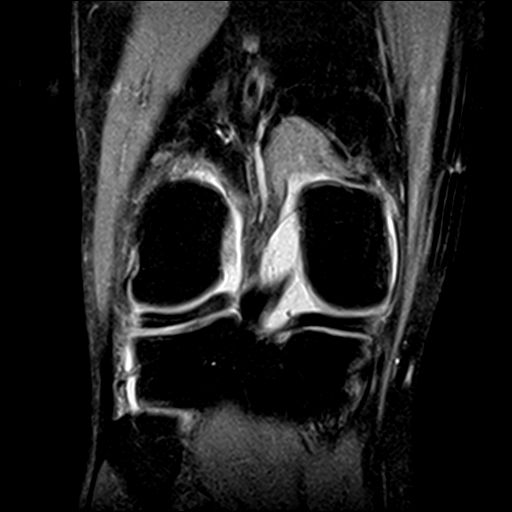

[Series 8: PD fat-sat · oblique · 2.0mm · 0.29mm/px · 3 of 11 slices shown (4 of 4)]
[im 1/11]
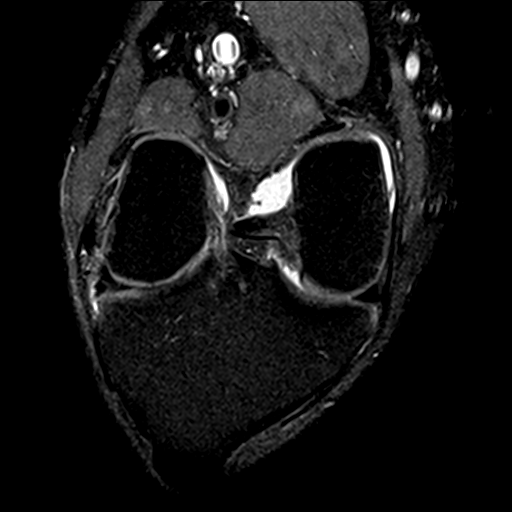
[im 6/11]
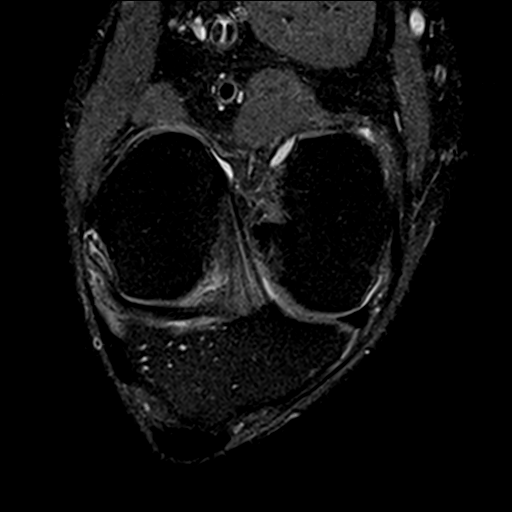
[im 11/11]
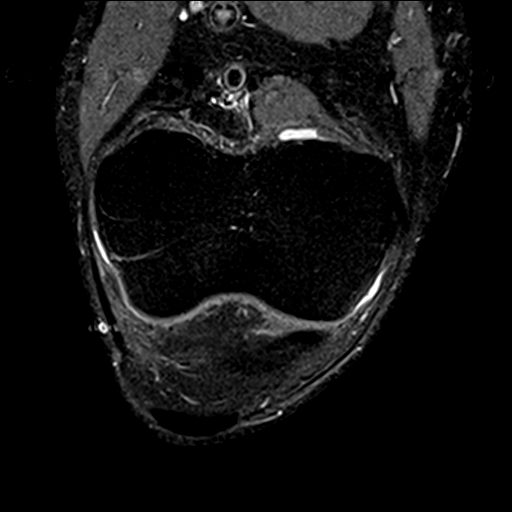

[19 of 40 positions shown; findings below may reference images not displayed]

FINDINGS: Intact.

MENISCI

Medial meniscus:  Normal.

Lateral meniscus: Incomplete discoid lateral meniscus. There is a
complex tear of the anterior body of the lateral meniscus (image 9
series 7). Longitudinal component exits the undersurface, with a
horizontal oblique component and exiting the superior surface of the
free edge. There is also a small radial component evident on axial
images. Mild subchondral edema in the lateral tibial plateau
associated with the meniscal tear.

LIGAMENTS

Cruciates:  Mucoid degeneration of the ACL.  PCL intact.

Collaterals:  Normal.

CARTILAGE

Patellofemoral: Mild patellofemoral osteoarthritis. Lateral facet
periapical grade III chondral fissure (image 8 series 3). Trochlear
cartilage appears preserved.

Medial:  Normal.

Lateral:  Normal.

Joint:  Moderate effusion.  Degenerative synovitis.

Popliteal Fossa:  Normal.

Extensor Mechanism:  Intact.

Bones:  No significant extra-articular findings.
IMPRESSION: 1. Incomplete discoid lateral meniscus with new complex tear in the
anterior body and anterior horn.
2. Mucoid degeneration of the ACL.
3. Moderate effusion and degenerative synovitis of the suprapatellar
pouch.
4. Mild patellofemoral osteoarthritis with minimal progression
compared to 12/16/2012 MRI.

## 2016-02-21 ENCOUNTER — Telehealth: Payer: Self-pay | Admitting: *Deleted

## 2016-02-21 ENCOUNTER — Other Ambulatory Visit: Payer: Self-pay | Admitting: Internal Medicine

## 2016-02-21 DIAGNOSIS — S83206D Unspecified tear of unspecified meniscus, current injury, right knee, subsequent encounter: Secondary | ICD-10-CM

## 2016-02-21 NOTE — Telephone Encounter (Signed)
Flexeril was short term medication. He is seeing Dr. Otelia SergeantNitka for his cervicalgia.

## 2016-02-23 ENCOUNTER — Ambulatory Visit (INDEPENDENT_AMBULATORY_CARE_PROVIDER_SITE_OTHER): Payer: Self-pay | Admitting: Pulmonary Disease

## 2016-02-23 ENCOUNTER — Encounter: Payer: Self-pay | Admitting: Pulmonary Disease

## 2016-02-23 VITALS — BP 148/73 | HR 66 | Temp 98.0°F | Resp 18 | Ht 73.0 in | Wt 205.3 lb

## 2016-02-23 DIAGNOSIS — Z7984 Long term (current) use of oral hypoglycemic drugs: Secondary | ICD-10-CM

## 2016-02-23 DIAGNOSIS — N182 Chronic kidney disease, stage 2 (mild): Secondary | ICD-10-CM

## 2016-02-23 DIAGNOSIS — M791 Myalgia, unspecified site: Secondary | ICD-10-CM

## 2016-02-23 DIAGNOSIS — Z0000 Encounter for general adult medical examination without abnormal findings: Secondary | ICD-10-CM

## 2016-02-23 DIAGNOSIS — J4540 Moderate persistent asthma, uncomplicated: Secondary | ICD-10-CM

## 2016-02-23 DIAGNOSIS — Z1211 Encounter for screening for malignant neoplasm of colon: Secondary | ICD-10-CM

## 2016-02-23 DIAGNOSIS — I129 Hypertensive chronic kidney disease with stage 1 through stage 4 chronic kidney disease, or unspecified chronic kidney disease: Secondary | ICD-10-CM

## 2016-02-23 DIAGNOSIS — E1122 Type 2 diabetes mellitus with diabetic chronic kidney disease: Secondary | ICD-10-CM

## 2016-02-23 DIAGNOSIS — I10 Essential (primary) hypertension: Secondary | ICD-10-CM

## 2016-02-23 DIAGNOSIS — J45909 Unspecified asthma, uncomplicated: Secondary | ICD-10-CM

## 2016-02-23 DIAGNOSIS — E785 Hyperlipidemia, unspecified: Secondary | ICD-10-CM

## 2016-02-23 LAB — POCT GLYCOSYLATED HEMOGLOBIN (HGB A1C): Hemoglobin A1C: 7.5

## 2016-02-23 LAB — GLUCOSE, CAPILLARY: Glucose-Capillary: 138 mg/dL — ABNORMAL HIGH (ref 65–99)

## 2016-02-23 MED ORDER — LISINOPRIL 10 MG PO TABS: 10.0000 mg | ORAL_TABLET | Freq: Every day | ORAL | Status: DC

## 2016-02-23 MED ORDER — CYCLOBENZAPRINE HCL 5 MG PO TABS: 5.0000 mg | ORAL_TABLET | Freq: Three times a day (TID) | ORAL | Status: DC | PRN

## 2016-02-23 NOTE — Patient Instructions (Addendum)
Take your Elwin SleightDulera twice a day Take albuterol as needed  Change your lisinopril to 2 tablets (10mg ) a day. When you run out of your current bottle you will take 1 tablet (10 mg) a day.  Follow up in 1 month

## 2016-02-23 NOTE — Telephone Encounter (Signed)
Patient has an appt today 02-23-16 with Dr. Isabella BowensKrall at 3:45 pm.

## 2016-02-23 NOTE — Progress Notes (Addendum)
   Subjective:    Patient ID: Javier [facility], male    DOB: 10/31/1965, 50 y.o.   MRN: [id number]  HPI [name] is a 50 year old man with history of DM2, asthma, HTN, HLD, chronic knee/shoulder/back pain presenting for follow up of his asthma.  Takes albuterol twice a day and Dulera once a day. Worse around this time of year.   Gets foot pain. Cramping. Intermittent. Has burning and cramping sensation in his thighs.   Review of Systems Constitutional: no fevers/chills Eyes: +has nearsightedness Ears, nose, mouth, throat, and face: no cough Respiratory: +occasional shortness of breath Cardiovascular: no chest pain Gastrointestinal: no nausea/vomiting, no abdominal pain, no constipation, no diarrhea Genitourinary: no dysuria, no hematuria  Past Medical History  Diagnosis Date  . Asthma   . Chronic back pain   . Bilateral chronic knee pain   . Chronic shoulder pain   . Hypertension   . Reflux   . Depression   . Hx of echocardiogram     a. Echo 10/24/12:  EF 60-65%, normal wall thickness, normal diastolic fxn  . Hx of cardiovascular stress test     a. Lexiscan Myoview 10/29/12:  EF 56%, no ischemia  . Allergy   . Hyperlipidemia   . Diabetes mellitus without complication (HCC)     diagnosed in 2015  . Renal disorder     chronic kidney disease  . History of herniated intervertebral disc   . GERD (gastroesophageal reflux disease)     Current Outpatient Prescriptions on File Prior to Visit  Medication Sig Dispense Refill  . acetaminophen (TYLENOL) 500 MG tablet Take 500 mg by mouth every 6 (six) hours as needed.    Marland Kitchen. albuterol (PROVENTIL HFA;VENTOLIN HFA) 108 (90 Base) MCG/ACT inhaler Inhale 2 puffs into the lungs every 6 (six) hours as needed for wheezing or shortness of breath. 54 g 3  . mometasone-formoterol (DULERA) 200-5 MCG/ACT AERO Inhale 1 puff into the lungs 2 (two) times daily. 1 Inhaler 11  . atorvastatin (LIPITOR) 40 MG tablet Take 2 tablets (80 mg total) by  mouth daily at 6 PM. 180 tablet 1  . cyclobenzaprine (FLEXERIL) 10 MG tablet Take 1 tablet (10 mg total) by mouth 3 (three) times daily as needed for muscle spasms. 60 tablet 0  . lisinopril (PRINIVIL,ZESTRIL) 5 MG tablet Take 1 tablet (5 mg total) by mouth daily. 90 tablet 1  . metFORMIN (GLUCOPHAGE XR) 500 MG 24 hr tablet Take 1 tablet (500 mg total) by mouth daily with breakfast. 90 tablet 1   No current facility-administered medications on file prior to visit.      Objective:   Physical Exam Blood pressure 148/73, pulse 66, temperature 98 F (36.7 C), temperature source Oral, resp. rate 18, height 6\' 1"  (1.854 m), weight 205 lb 4.8 oz (93.123 kg), SpO2 98 %. General Apperance: NAD HEENT: Normocephalic, atraumatic, anicteric sclera Neck: Supple, trachea midline Lungs: Clear to auscultation bilaterally. No wheezes, rhonchi or rales. Breathing comfortably Heart: Regular rate and rhythm, no murmur/rub/gallop Abdomen: Soft, nontender, nondistended, no rebound/guarding Extremities: Warm and well perfused, no edema Skin: No rashes or lesions Neurologic: Alert and interactive. No gross deficits.    Assessment & Plan:  Please refer to problem based charting.

## 2016-02-24 DIAGNOSIS — E559 Vitamin D deficiency, unspecified: Secondary | ICD-10-CM | POA: Insufficient documentation

## 2016-02-24 LAB — BMP8+ANION GAP
ANION GAP: 16 mmol/L (ref 10.0–18.0)
BUN/Creatinine Ratio: 7 — ABNORMAL LOW (ref 9–20)
BUN: 9 mg/dL (ref 6–24)
CO2: 27 mmol/L (ref 18–29)
Calcium: 10.1 mg/dL (ref 8.7–10.2)
Chloride: 98 mmol/L (ref 96–106)
Creatinine, Ser: 1.22 mg/dL (ref 0.76–1.27)
GFR, EST AFRICAN AMERICAN: 79 mL/min/{1.73_m2} (ref >59)
GFR, EST NON AFRICAN AMERICAN: 69 mL/min/{1.73_m2} (ref >59)
Glucose: 126 mg/dL — ABNORMAL HIGH (ref 65–99)
POTASSIUM: 4.6 mmol/L (ref 3.5–5.2)
Sodium: 141 mmol/L (ref 134–144)

## 2016-02-24 LAB — LIPID PANEL
CHOL/HDL RATIO: 3.1 ratio (ref 0.0–5.0)
Cholesterol, Total: 187 mg/dL (ref 100–199)
HDL: 60 mg/dL (ref >39)
LDL CALC: 93 mg/dL (ref 0–99)
Triglycerides: 169 mg/dL — ABNORMAL HIGH (ref 0–149)
VLDL Cholesterol Cal: 34 mg/dL (ref 5–40)

## 2016-02-24 LAB — VITAMIN D 25 HYDROXY (VIT D DEFICIENCY, FRACTURES): Vit D, 25-Hydroxy: 12.2 ng/mL — ABNORMAL LOW (ref 30.0–100.0)

## 2016-02-24 MED ORDER — VITAMIN D 1000 UNITS PO TABS: 1000.0000 [IU] | ORAL_TABLET | Freq: Every day | ORAL | Status: DC

## 2016-02-24 NOTE — Assessment & Plan Note (Signed)
Cr stable on repeat BMP today  BMP Latest Ref Rng 02/23/2016 05/10/2015 12/28/2014  Glucose 65 - 99 mg/dL 161(W126(H) 960(A146(H) 86  BUN 6 - 24 mg/dL 9 9 12   Creatinine 0.76 - 1.27 mg/dL 5.401.22 9.811.20 1.911.20  BUN/Creat Ratio 9 - 20 7(L) - -  Sodium 134 - 144 mmol/L 141 138 139  Potassium 3.5 - 5.2 mmol/L 4.6 4.0 4.2  Chloride 96 - 106 mmol/L 98 103 104  CO2 18 - 29 mmol/L 27 26 25   Calcium 8.7 - 10.2 mg/dL 47.810.1 9.3 9.1

## 2016-02-24 NOTE — Assessment & Plan Note (Signed)
Assessment: BP deteriorated and now 148/73.  Plan:  Increase lisinopril form 5mg  daily to 10mg  daily Follow up in 4-6 weeks

## 2016-02-24 NOTE — Assessment & Plan Note (Addendum)
Bilateral thigh cramping pain. Vit D lvl 12.2  Start vitamin D 1000u daily. Recheck level in 3 months. If no improvement with symptoms may consider decreasing atorvastatin from 80mg  daily to 40mg  daily. Short supply of Flexeril for now. 5mg  TID prn #30.

## 2016-02-24 NOTE — Assessment & Plan Note (Signed)
Assessment: Confusion about inhaler dosing. He was taking dulera once a day.  Plan: Take Dulera twice a day and albuterol as needed. May consider antihistamine or step up therapy if not controlled on this regimen

## 2016-02-24 NOTE — Assessment & Plan Note (Signed)
Declined pneumococcal vaccine. Ordered screening colonoscopy.

## 2016-02-24 NOTE — Assessment & Plan Note (Signed)
A1c 7.5 today.  Continue metformin 500mg  daily. May need to uptitrate metformin if his A1c continues to deteriorate.

## 2016-02-24 NOTE — Assessment & Plan Note (Signed)
Lipid Panel     Component Value Date/Time   CHOL 187 02/23/2016 1636   CHOL 210* 01/20/2015 1104   TRIG 169* 02/23/2016 1636   HDL 60 02/23/2016 1636   HDL 59 01/20/2015 1104   CHOLHDL 3.1 02/23/2016 1636   CHOLHDL 3.6 01/20/2015 1104   VLDL 19 01/20/2015 1104   LDLCALC 93 02/23/2016 1636   LDLCALC 132* 01/20/2015 1104    LDL and total cholesterol improved. Continue atorvastatin 80mg  for now.

## 2016-02-27 NOTE — Progress Notes (Signed)
Internal Medicine Clinic Attending  Case discussed with Dr. Krall at the time of the visit.  We reviewed the resident's history and exam and pertinent patient test results.  I agree with the assessment, diagnosis, and plan of care documented in the resident's note.  

## 2016-03-02 NOTE — Addendum Note (Signed)
Addended by: Neomia DearPOWERS, Malary Aylesworth E on: 03/02/2016 02:48 PM   Modules accepted: Orders

## 2016-03-29 ENCOUNTER — Other Ambulatory Visit: Payer: Self-pay | Admitting: Pulmonary Disease

## 2016-03-29 ENCOUNTER — Other Ambulatory Visit (INDEPENDENT_AMBULATORY_CARE_PROVIDER_SITE_OTHER): Payer: Self-pay

## 2016-03-29 DIAGNOSIS — I10 Essential (primary) hypertension: Secondary | ICD-10-CM

## 2016-03-29 DIAGNOSIS — Z1211 Encounter for screening for malignant neoplasm of colon: Secondary | ICD-10-CM

## 2016-03-29 LAB — POC HEMOCCULT BLD/STL (HOME/3-CARD/SCREEN)
Card #2 Fecal Occult Blod, POC: NEGATIVE
FECAL OCCULT BLD: NEGATIVE
Fecal Occult Blood, POC: NEGATIVE

## 2016-03-29 NOTE — Addendum Note (Signed)
Addended by: Griffin BasilKRALL, Liron Eissler T on: 03/29/2016 02:31 PM   Modules accepted: Orders

## 2016-03-29 NOTE — Telephone Encounter (Signed)
Pt needs appt

## 2016-03-31 MED ORDER — LISINOPRIL 10 MG PO TABS: 10.0000 mg | ORAL_TABLET | Freq: Every day | ORAL | Status: DC

## 2016-04-11 ENCOUNTER — Ambulatory Visit: Payer: Self-pay

## 2016-04-18 ENCOUNTER — Telehealth: Payer: Self-pay | Admitting: Pulmonary Disease

## 2016-04-18 NOTE — Telephone Encounter (Signed)
APT. REMINDER CALL, LMTCB °

## 2016-04-19 ENCOUNTER — Encounter: Payer: Self-pay | Admitting: Pulmonary Disease

## 2016-04-19 ENCOUNTER — Ambulatory Visit: Payer: Self-pay | Admitting: Pharmacist

## 2016-04-24 ENCOUNTER — Ambulatory Visit: Payer: Self-pay | Admitting: Pharmacist

## 2016-05-10 ENCOUNTER — Ambulatory Visit (INDEPENDENT_AMBULATORY_CARE_PROVIDER_SITE_OTHER): Payer: Self-pay | Admitting: Pulmonary Disease

## 2016-05-10 ENCOUNTER — Encounter: Payer: Self-pay | Admitting: Pulmonary Disease

## 2016-05-10 VITALS — BP 130/82 | HR 78 | Temp 98.1°F | Resp 20 | Ht 71.0 in | Wt 197.4 lb

## 2016-05-10 DIAGNOSIS — M545 Low back pain, unspecified: Secondary | ICD-10-CM

## 2016-05-10 DIAGNOSIS — E1122 Type 2 diabetes mellitus with diabetic chronic kidney disease: Secondary | ICD-10-CM

## 2016-05-10 DIAGNOSIS — I10 Essential (primary) hypertension: Secondary | ICD-10-CM

## 2016-05-10 DIAGNOSIS — N182 Chronic kidney disease, stage 2 (mild): Secondary | ICD-10-CM

## 2016-05-10 DIAGNOSIS — E559 Vitamin D deficiency, unspecified: Secondary | ICD-10-CM

## 2016-05-10 DIAGNOSIS — M79672 Pain in left foot: Secondary | ICD-10-CM

## 2016-05-10 DIAGNOSIS — E785 Hyperlipidemia, unspecified: Secondary | ICD-10-CM

## 2016-05-10 DIAGNOSIS — J4540 Moderate persistent asthma, uncomplicated: Secondary | ICD-10-CM

## 2016-05-10 DIAGNOSIS — G8929 Other chronic pain: Secondary | ICD-10-CM

## 2016-05-10 DIAGNOSIS — Z0000 Encounter for general adult medical examination without abnormal findings: Secondary | ICD-10-CM

## 2016-05-10 DIAGNOSIS — B351 Tinea unguium: Secondary | ICD-10-CM

## 2016-05-10 DIAGNOSIS — M47812 Spondylosis without myelopathy or radiculopathy, cervical region: Secondary | ICD-10-CM

## 2016-05-10 LAB — GLUCOSE, CAPILLARY: Glucose-Capillary: 109 mg/dL — ABNORMAL HIGH (ref 65–99)

## 2016-05-10 MED ORDER — METFORMIN HCL ER 500 MG PO TB24: 500.0000 mg | ORAL_TABLET | Freq: Every day | ORAL | 1 refills | Status: DC

## 2016-05-10 MED ORDER — ATORVASTATIN CALCIUM 40 MG PO TABS: 80.0000 mg | ORAL_TABLET | Freq: Every day | ORAL | 1 refills | Status: DC

## 2016-05-10 MED ORDER — LISINOPRIL 10 MG PO TABS: 10.0000 mg | ORAL_TABLET | Freq: Every day | ORAL | 1 refills | Status: DC

## 2016-05-10 NOTE — Assessment & Plan Note (Addendum)
Assessment: BP controlled. 130/82 today. BMP unremarkable.  Plan:  Continue lisinopril 10mg  daily.

## 2016-05-10 NOTE — Assessment & Plan Note (Addendum)
Random glucose 109. Doing well on metformin.

## 2016-05-10 NOTE — Progress Notes (Signed)
   CC: right arm pain  HPI:  [name] is a 50 year old man with history of DM2, asthma, HTN, HLD, chronic knee/shoulder/back pain presenting for follow up of his asthma.  Thigh cramping: Taking vitamin D. Thigh cramping has improved greatly.   Asthma: Taking Dulera twice a day. Takes Flonase and Claritin prn.  R upper arm numbness, pain he had before is coming back. Previously was controlled with PT. Followed by Dr. Otelia SergeantNitka.   Cramping right back. Comes and goes. Has been going on for the past two weeks. No dysuria. No urinary frequency. No hematuria. Similar to cramping on left side.   Left fifth toe trauma. Able to bear weight on it now. Improved greatly.  Large toenail with darkening on sides.    Past Medical History:  Diagnosis Date  . Allergy   . Asthma   . Bilateral chronic knee pain   . Chronic back pain   . Chronic shoulder pain   . Depression   . Diabetes mellitus without complication (HCC)    diagnosed in 2015  . GERD (gastroesophageal reflux disease)   . History of herniated intervertebral disc   . Hx of cardiovascular stress test    a. Lexiscan Myoview 10/29/12:  EF 56%, no ischemia  . Hx of echocardiogram    a. Echo 10/24/12:  EF 60-65%, normal wall thickness, normal diastolic fxn  . Hyperlipidemia   . Hypertension   . Reflux   . Renal disorder    chronic kidney disease    Review of Systems:   Constitutional: no fevers/chills Respiratory: no shortness of breath Cardiovascular: no chest pain  Physical Exam:  Vitals:   05/10/16 1327  BP: 130/82  Pulse: 78  Resp: 20  Temp: 98.1 F (36.7 C)  TempSrc: Oral  SpO2: 99%  Weight: 89.5 kg (197 lb 6.4 oz)  Height: 5\' 11"  (1.803 m)   General Apperance: NAD HEENT: Normocephalic, atraumatic, anicteric sclera Neck: Supple, trachea midline Lungs: Clear to auscultation bilaterally. No wheezes, rhonchi or rales. Breathing comfortably Heart: Regular rate and rhythm, no murmur/rub/gallop Abdomen: Soft,  nontender, nondistended, no rebound/guarding Extremities: Warm and well perfused, no edema Skin: No rashes or lesions Neurologic: Alert and interactive. No gross deficits.   Assessment & Plan:   See Encounters Tab for problem based charting.  Patient discussed with Dr. Oswaldo DoneVincent

## 2016-05-10 NOTE — Progress Notes (Signed)
Medication Samples have been provided to the patient.  Drug name: Javier SleightDulera       Strength: 26700mcg/5mcg        Qty: 3 inhalers  LOT: [id number]  Exp.Date: 09/04/2016  Dosing instructions: Inhale 1 puff twice daily.  The patient has been instructed regarding the correct time, dose, and frequency of taking this medication, including desired effects and most common side effects.   Javier Beasley 1:43 PM 05/10/2016

## 2016-05-10 NOTE — Patient Instructions (Addendum)
Vira BrownsJames Nitka, MD [address], [place], [place] [address] [phone]  Take Claritin daily. If your asthma is still not improved, take your Flonase 1 spray each nostril every day.  For your back pain,  Take ibuprofen (400 mg four times daily) or naproxen (250 to 500 mg twice daily) for 7-10 days. You may use heating pads. Your back pain will take 4-6 weeks to improve. If it has not improved after a couple of months, please let us know.

## 2016-05-11 ENCOUNTER — Encounter: Payer: Self-pay | Admitting: Pulmonary Disease

## 2016-05-11 DIAGNOSIS — M47812 Spondylosis without myelopathy or radiculopathy, cervical region: Secondary | ICD-10-CM | POA: Insufficient documentation

## 2016-05-11 DIAGNOSIS — B351 Tinea unguium: Secondary | ICD-10-CM | POA: Insufficient documentation

## 2016-05-11 LAB — BMP8+ANION GAP
ANION GAP: 15 mmol/L (ref 10.0–18.0)
BUN/Creatinine Ratio: 8 — ABNORMAL LOW (ref 9–20)
BUN: 10 mg/dL (ref 6–24)
CALCIUM: 9.6 mg/dL (ref 8.7–10.2)
CHLORIDE: 100 mmol/L (ref 96–106)
CO2: 28 mmol/L (ref 18–29)
Creatinine, Ser: 1.25 mg/dL (ref 0.76–1.27)
GFR calc Af Amer: 77 mL/min/{1.73_m2} (ref >59)
GFR, EST NON AFRICAN AMERICAN: 67 mL/min/{1.73_m2} (ref >59)
GLUCOSE: 83 mg/dL (ref 65–99)
POTASSIUM: 4.4 mmol/L (ref 3.5–5.2)
SODIUM: 143 mmol/L (ref 134–144)

## 2016-05-11 LAB — VITAMIN D 25 HYDROXY (VIT D DEFICIENCY, FRACTURES): VIT D 25 HYDROXY: 22.4 ng/mL — AB (ref 30.0–100.0)

## 2016-05-11 NOTE — Assessment & Plan Note (Signed)
Assessment: MRI from February with cervical spondylosis and degenerative disc disease, causing moderate impingement at C5-6 and C6-7. Borderline right eccentric impingement at C2-3 and C3-4. He has undergone physical therapy. Recurrence of same pain.  Plan: Patient will follow up with Dr. Otelia SergeantNitka.

## 2016-05-11 NOTE — Assessment & Plan Note (Signed)
Assessment: R lower back cramping pain. On physical exam, tender to palpation along muscles. No CVA tenderness. Denies dysuria or hematuria.  Plan: NSAIDS and heat prn If pain does not improve or worsens, would obtain CT scan to look for possible renal stone. Also obtain UA.

## 2016-05-11 NOTE — Assessment & Plan Note (Signed)
Declined influenza and pneumococcal vaccines. Willing to consider at next visit

## 2016-05-11 NOTE — Assessment & Plan Note (Signed)
Assessment: thigh cramping greatly improved with vitamin D supplementation.  Plan: If pain does not resolve completely or worsens, obtain CK. Consider changing statin to pravastatin or rosuvastatin.

## 2016-05-11 NOTE — Assessment & Plan Note (Signed)
Left fifth toe trauma with improvement. Mild tenderness to palpation along lateral MTP. Able to bear weight and walk.  Continue to monitor.

## 2016-05-11 NOTE — Assessment & Plan Note (Signed)
In the past four weeks,  How much of the time did your asthma keep you from getting as much done at work or at home: All of the time How often have you had shortness of breath: More than once a day How often did your asthma symptoms (wheezing, coughing, SOB, chest tightness) wake you up at night or earlier than usual in the morning: Not at all How often have you used your rescue inhaler: 1-2 times per day How would you rate your asthma control: somewhat controlled  Assessment: Somewhat controlled. He reports he is sensitive to grass and other environmental allergens.  Plan: Continue Dulera twice a day and albuterol as needed Take Claritin daily Take Flonase daily if still notcontrolled Follow up in 3-4 months

## 2016-05-11 NOTE — Assessment & Plan Note (Signed)
Left great toe with darkening on lateral aspects of toenail. No discoloration of skin. No linear demarcations.  Probable onychomycosis. Can monitor for now.

## 2016-05-14 NOTE — Progress Notes (Signed)
Internal Medicine Clinic Attending  Case discussed with Dr. Krall at the time of the visit.  We reviewed the resident's history and exam and pertinent patient test results.  I agree with the assessment, diagnosis, and plan of care documented in the resident's note.  

## 2016-06-06 ENCOUNTER — Telehealth: Payer: Self-pay | Admitting: Pulmonary Disease

## 2016-06-06 NOTE — Telephone Encounter (Signed)
APT. REMINDER CALL, NO ANSWER, NO VOICEMAIL °

## 2016-06-07 ENCOUNTER — Ambulatory Visit: Payer: Self-pay

## 2016-06-08 ENCOUNTER — Telehealth: Payer: Self-pay | Admitting: Pulmonary Disease

## 2016-06-08 NOTE — Telephone Encounter (Signed)
APT. REMINDER CALL, NO ANSWER, NO VOICEMAIL °

## 2016-06-11 ENCOUNTER — Ambulatory Visit: Payer: Self-pay

## 2016-06-12 ENCOUNTER — Encounter: Payer: Self-pay | Admitting: Pulmonary Disease

## 2016-06-28 ENCOUNTER — Encounter: Payer: Self-pay | Admitting: Pulmonary Disease

## 2016-07-16 ENCOUNTER — Encounter (INDEPENDENT_AMBULATORY_CARE_PROVIDER_SITE_OTHER): Payer: Self-pay | Admitting: Specialist

## 2016-07-16 ENCOUNTER — Ambulatory Visit (INDEPENDENT_AMBULATORY_CARE_PROVIDER_SITE_OTHER): Payer: Self-pay | Admitting: Specialist

## 2016-07-16 VITALS — BP 136/98 | HR 65 | Ht 73.0 in | Wt 200.0 lb

## 2016-07-16 DIAGNOSIS — M5030 Other cervical disc degeneration, unspecified cervical region: Secondary | ICD-10-CM

## 2016-07-16 DIAGNOSIS — M5020 Other cervical disc displacement, unspecified cervical region: Secondary | ICD-10-CM

## 2016-07-16 DIAGNOSIS — M5412 Radiculopathy, cervical region: Secondary | ICD-10-CM

## 2016-07-16 NOTE — Patient Instructions (Signed)
Avoid overhead lifting and overhead use of the arm. No lifting over 15 lbs. MRI ordered due to ast MRI being older than 6 months. Return in 2-3 weeks to review the MRI  Unable to perform work due to persisting right cervical radiculopathy. Tramadol 1-2 every 6 hours for pain.

## 2016-07-16 NOTE — Progress Notes (Signed)
Office Visit Note   Patient: Javier Beasley           Date of Birth: 10/27/1965           MRN: [id number] Visit Date: 07/16/2016              Requested by: Lora PaulaJennifer T Krall, MD [address] [place], [place] [address] PCP: Griffin BasilKrall, Jennifer, MD   Assessment & Plan: Visit Diagnoses:  1. Right cervical radiculopathy   2. Herniated cervical intervertebral disc   3. Degenerative, intervertebral disc, cervical     Plan: Avoid overhead lifting and overhead use of the arm. No lifting over 15 lbs. MRI ordered due to ast MRI being older than 6 months. Return in 2-3 weeks to review the MRI  Unable to perform work due to persisting right cervical radiculopathy. Tramadol 1-2 every 6 hours for pain.   Follow-Up Instructions: Return in about 2 weeks (around 07/30/2016) for Review the MRI and discuss surgery..   Orders:  Orders Placed This Encounter  Procedures  . MR Cervical Spine w/o contrast   No orders of the defined types were placed in this encounter.     Procedures: No procedures performed   Clinical Data: No additional findings.   Subjective: Chief Complaint  Patient presents with  . Neck - Pain    Patient last seen for neck pain on 11/24/2015. Patient returns today stating pain is becoming worse. Pain is radiating into bilateral shoulders. Right arm pain with numbness in fingers. States he hears cracking noise at times with certain movements. Here to discuss surgery today. Pain comes and goes and right more than the left radiates into the right radial 3 fingers. Occurs with watching TV Not driving as much due light hurting his eyes and with the use of keyboard and computer the pain  In the right arm worsens. Has had previous MRI of neck in February of this year with sever spondylosis of the cervical spine with disc osteophytes causing spinal stenosis of the cervical spine. No bowel or bladder difficulty. Able to ambulate but has been stumbling no fall. Knees still with pain  post arthroscopy of the knee. Has been trying to get by without surgery but is reaching the point that he wishing to consider intervention.    Review of Systems  HENT: Negative.   Eyes: Positive for photophobia and visual disturbance. Negative for pain, discharge and itching.  Respiratory: Positive for cough, chest tightness, shortness of breath and wheezing. Negative for apnea, choking and stridor.   Cardiovascular: Positive for chest pain. Negative for palpitations and leg swelling.  Gastrointestinal: Positive for abdominal pain and nausea.  Endocrine: Negative for cold intolerance, heat intolerance, polydipsia, polyphagia and polyuria.  Genitourinary: Positive for frequency and urgency.  Musculoskeletal: Positive for gait problem, myalgias, neck pain and neck stiffness. Negative for arthralgias, back pain and joint swelling.  Skin: Negative.   Allergic/Immunologic: Negative for environmental allergies, food allergies and immunocompromised state.  Neurological: Positive for weakness and numbness. Negative for dizziness, tremors, seizures, syncope, facial asymmetry, speech difficulty, light-headedness and headaches.  Hematological: Negative for adenopathy. Does not bruise/bleed easily.  Psychiatric/Behavioral: Negative for agitation, behavioral problems, confusion, decreased concentration, dysphoric mood, hallucinations, self-injury, sleep disturbance and suicidal ideas. The patient is nervous/anxious. The patient is not hyperactive.      Objective: Vital Signs: BP (!) 136/98   Pulse 65   Ht 6\' 1"  (1.854 m)   Wt 200 lb (90.7 kg)   BMI 26.39 kg/m  Physical Exam  Constitutional: He is oriented to person, place, and time. He appears well-developed and well-nourished.  HENT:  Head: Normocephalic and atraumatic.  Eyes: EOM are normal. Pupils are equal, round, and reactive to light.  Neck: Normal range of motion. Neck supple.  Cardiovascular: Normal rate, regular rhythm and normal  heart sounds.   Pulmonary/Chest: Effort normal and breath sounds normal.  Abdominal: Soft. Bowel sounds are normal.  Musculoskeletal: He exhibits tenderness.  Neurological: He is alert and oriented to person, place, and time. He displays abnormal reflex. No cranial nerve deficit. He exhibits abnormal muscle tone. Coordination normal.  Skin: Skin is warm and dry.  Psychiatric: He has a normal mood and affect. His behavior is normal. Judgment and thought content normal.    Back Exam   Tenderness  The patient is experiencing tenderness in the cervical.  Range of Motion  Extension:  30 abnormal  Lateral Bend Right: 60  Lateral Bend Left:  40 abnormal  Rotation Right: 70  Rotation Left: 70   Muscle Strength  Right Quadriceps:  5/5  Left Quadriceps:  5/5  Right Hamstrings:  5/5  Left Hamstrings:  5/5   Tests  Straight leg raise right: negative Straight leg raise left: negative  Reflexes  Patellar: normal Achilles: normal Biceps: 2/4 Babinski's sign: normal   Other  Toe Walk: normal Heel Walk: normal Sensation: normal Gait: normal   Comments:  Positive spurling sign right lat bending and extension of the neck. Negative Hoffman's sign. Weak right triceps  4/5. Finger extension is normal .      Specialty Comments:  No specialty comments available.  Imaging: No results found.   PMFS History: Patient Active Problem List   Diagnosis Date Noted  . Herniated cervical intervertebral disc 07/16/2016    Priority: High    Class: Chronic  . Cervical spine degeneration 05/11/2016  . Onychomycosis 05/11/2016  . Vitamin D deficiency 02/24/2016  . Acute meniscal tear of right knee 04/29/2015  . Foot pain, left 01/20/2015  . Essential hypertension 09/21/2014  . Diabetes mellitus with stage 2 chronic kidney disease (HCC) 07/05/2014  . Health care maintenance 07/05/2014  . Labral tear of shoulder 02/11/2014  . HLD (hyperlipidemia) 11/03/2012  . CKD (chronic kidney  disease) stage 2, GFR 60-89 ml/min 11/03/2012  . Esophagitis 10/24/2012  . Asthma 10/23/2012  . Chronic low back pain 11/12/2011   Past Medical History:  Diagnosis Date  . Allergy   . Asthma   . Bilateral chronic knee pain   . Chronic back pain   . Chronic shoulder pain   . Depression   . Diabetes mellitus without complication (HCC)    diagnosed in 2015  . GERD (gastroesophageal reflux disease)   . History of herniated intervertebral disc   . Hx of cardiovascular stress test    a. Lexiscan Myoview 10/29/12:  EF 56%, no ischemia  . Hx of echocardiogram    a. Echo 10/24/12:  EF 60-65%, normal wall thickness, normal diastolic fxn  . Hyperlipidemia   . Hypertension   . Reflux   . Renal disorder    chronic kidney disease    Family History  Problem Relation Age of Onset  . Hypertension Other   . Diabetes Other   . Heart attack Other   . Diabetes Mother   . Hypertension Mother   . Hyperlipidemia Neg Hx   . Sudden death Neg Hx   . Hypertension Sister   . Stroke Sister   . Hypertension Brother   .  Asthma Daughter   . Bladder Cancer Mother     Past Surgical History:  Procedure Laterality Date  . BALLOON DILATION N/A 10/24/2012   Procedure: BALLOON DILATION;  Surgeon: Willis Modena, MD;  Location[facility] ENDOSCOPY;  Service: Endoscopy;  Laterality: N/A;  . ESOPHAGOGASTRODUODENOSCOPY Left 10/24/2012   Procedure: ESOPHAGOGASTRODUODENOSCOPY (EGD);  Surgeon: Willis Modena, MD;  Location: [facility] ENDOSCOPY;  Service: Endoscopy;  Laterality: Left;  possible balloon dilation  . KNEE ARTHROSCOPY WITH EXCISION PLICA  05/11/2015   Procedure: KNEE ARTHROSCOPY WITH EXCISION PLICA;  Surgeon: Tarry Kos, MD;  Location: [facility];  Service: Orthopedics;;  . KNEE ARTHROSCOPY WITH LATERAL MENISECTOMY  05/11/2015   Procedure: KNEE ARTHROSCOPY WITH LATERAL MENISECTOMY;  Surgeon: Tarry Kos, MD;  Location: [facility];  Service: Orthopedics;;  . SYNOVECTOMY Right 05/11/2015    Procedure: SYNOVECTOMY;  Surgeon: Tarry Kos, MD;  Location:  SURGERY CENTER;  Service: Orthopedics;  Laterality: Right;   Social History   Occupational History  . Not on file.   Social History Main Topics  . Smoking status: Never Smoker  . Smokeless tobacco: Never Used  . Alcohol use 1.8 oz/week    3 Cans of beer per week     Comment: Beer.  . Drug use:     Types: Marijuana     Comment: daily MJ use for pain control.- smoked 1 mo ago  . Sexual activity: Not on file

## 2016-07-19 ENCOUNTER — Telehealth (INDEPENDENT_AMBULATORY_CARE_PROVIDER_SITE_OTHER): Payer: Self-pay | Admitting: *Deleted

## 2016-07-19 NOTE — Telephone Encounter (Signed)
Pt has appt for MRI on 07/27/16 at 320pm, needs follow up appt with JN but no openings until sometime in Dec, pt does not wants to wsait that long to discuss results. Who can I schedule pt with [facility] Please advise!

## 2016-07-23 NOTE — Telephone Encounter (Signed)
Will you please call patient and see if they can come in on 08/02/2016 at 8:45am, this is the first time I will be able to offer patient. This will be for MRI review.  Thanks

## 2016-07-24 NOTE — Telephone Encounter (Signed)
Spoke with pt and advised appointment on 08/02/16 @ 8:45am per Herbert SetaHeather

## 2016-07-27 ENCOUNTER — Other Ambulatory Visit: Payer: Self-pay

## 2016-07-31 ENCOUNTER — Telehealth (INDEPENDENT_AMBULATORY_CARE_PROVIDER_SITE_OTHER): Payer: Self-pay | Admitting: *Deleted

## 2016-07-31 NOTE — Telephone Encounter (Signed)
PT had MRI review scheduled per heather for 11/16 but pt MRI isn't until 11/22. I scheduled pt for next available 12/13 but didn't know if he needed this reviewed sooner.

## 2016-08-02 ENCOUNTER — Ambulatory Visit (INDEPENDENT_AMBULATORY_CARE_PROVIDER_SITE_OTHER): Payer: Self-pay | Admitting: Specialist

## 2016-08-08 ENCOUNTER — Ambulatory Visit
Admission: RE | Admit: 2016-08-08 | Discharge: 2016-08-08 | Disposition: A | Discharge status: Home / Self Care | Payer: No Typology Code available for payment source | Source: Ambulatory Visit | Attending: Specialist | Admitting: Specialist

## 2016-08-29 ENCOUNTER — Ambulatory Visit (INDEPENDENT_AMBULATORY_CARE_PROVIDER_SITE_OTHER): Payer: No Typology Code available for payment source | Admitting: Pulmonary Disease

## 2016-08-29 ENCOUNTER — Ambulatory Visit (INDEPENDENT_AMBULATORY_CARE_PROVIDER_SITE_OTHER): Payer: Self-pay | Admitting: Specialist

## 2016-08-29 ENCOUNTER — Encounter (INDEPENDENT_AMBULATORY_CARE_PROVIDER_SITE_OTHER): Payer: Self-pay | Admitting: Specialist

## 2016-08-29 VITALS — BP 162/82 | HR 99 | Temp 98.0°F | Wt 199.8 lb

## 2016-08-29 VITALS — BP 136/98 | HR 68 | Ht 73.0 in | Wt 200.0 lb

## 2016-08-29 DIAGNOSIS — I129 Hypertensive chronic kidney disease with stage 1 through stage 4 chronic kidney disease, or unspecified chronic kidney disease: Secondary | ICD-10-CM

## 2016-08-29 DIAGNOSIS — R42 Dizziness and giddiness: Secondary | ICD-10-CM

## 2016-08-29 DIAGNOSIS — H8112 Benign paroxysmal vertigo, left ear: Secondary | ICD-10-CM

## 2016-08-29 DIAGNOSIS — Z7984 Long term (current) use of oral hypoglycemic drugs: Secondary | ICD-10-CM

## 2016-08-29 DIAGNOSIS — M4722 Other spondylosis with radiculopathy, cervical region: Secondary | ICD-10-CM

## 2016-08-29 DIAGNOSIS — I10 Essential (primary) hypertension: Secondary | ICD-10-CM

## 2016-08-29 DIAGNOSIS — E1122 Type 2 diabetes mellitus with diabetic chronic kidney disease: Secondary | ICD-10-CM

## 2016-08-29 DIAGNOSIS — N182 Chronic kidney disease, stage 2 (mild): Secondary | ICD-10-CM

## 2016-08-29 LAB — POCT GLYCOSYLATED HEMOGLOBIN (HGB A1C): HEMOGLOBIN A1C: 7.2

## 2016-08-29 LAB — GLUCOSE, CAPILLARY: Glucose-Capillary: 112 mg/dL — ABNORMAL HIGH (ref 65–99)

## 2016-08-29 MED ORDER — CERVICAL TRACTION KIT: PACK | 0 refills | Status: DC

## 2016-08-29 MED ORDER — LISINOPRIL 10 MG PO TABS: 10.0000 mg | ORAL_TABLET | Freq: Every day | ORAL | 0 refills | Status: DC

## 2016-08-29 NOTE — Patient Instructions (Signed)
Avoid overhead lifting and overhead use of the arms. Do not lift greater than 5 lbs. Adjust head rest in vehicle to prevent hyperextension if rear ended. Return appointment if needed. Will write order for saunders traction unit to use at home chronically.

## 2016-08-29 NOTE — Addendum Note (Signed)
Addended by: Vira BrownsNITKA, Keeya Dyckman on: 08/29/2016 11:09 AM   Modules accepted: Orders

## 2016-08-29 NOTE — Patient Instructions (Signed)
Benign Positional Vertigo Introduction Vertigo is the feeling that you or your surroundings are moving when they are not. Benign positional vertigo is the most common form of vertigo. The cause of this condition is not serious (is benign). This condition is triggered by certain movements and positions (is positional). This condition can be dangerous if it occurs while you are doing something that could endanger you or others, such as driving.  What are the causes? In many cases, the cause of this condition is not known. It may be caused by a disturbance in an area of the inner ear that helps your brain to sense movement and balance. This disturbance can be caused by a viral infection (labyrinthitis), head injury, or repetitive motion.  What increases the risk? This condition is more likely to develop in:  Women.  People who are 50 years of age or older.  What are the signs or symptoms? Symptoms of this condition usually happen when you move your head or your eyes in different directions. Symptoms may start suddenly, and they usually last for less than a minute. Symptoms may include:  Loss of balance and falling.  Feeling like you are spinning or moving.  Feeling like your surroundings are spinning or moving.  Nausea and vomiting.  Blurred vision.  Dizziness.  Involuntary eye movement (nystagmus). Symptoms can be mild and cause only slight annoyance, or they can be severe and interfere with daily life. Episodes of benign positional vertigo may return (recur) over time, and they may be triggered by certain movements. Symptoms may improve over time.  How is this treated? Usually, your health care provider will treat this by moving your head in specific positions to adjust your inner ear back to normal. Surgery may be needed in severe cases, but this is rare. In some cases, benign positional vertigo may resolve on its own in 2-4 weeks.  Follow these instructions at  home: Safety  Move slowly.Avoid sudden body or head movements.  Avoid driving.  Avoid operating heavy machinery.  Avoid doing any tasks that would be dangerous to you or others if a vertigo episode would occur.  If you have trouble walking or keeping your balance, try using a cane for stability. If you feel dizzy or unstable, sit down right away.  Return to your normal activities as told by your health care provider. Ask your health care provider what activities are safe for you.  General instructions  Take over-the-counter and prescription medicines only as told by your health care provider.  Avoid certain positions or movements as told by your health care provider.  Drink enough fluid to keep your urine clear or pale yellow.  Keep all follow-up visits as told by your health care provider. This is important.  Contact a health care provider if:  You have a fever.  Your condition gets worse or you develop new symptoms.  Your family or friends notice any behavioral changes.  Your nausea or vomiting gets worse.  You have numbness or a "pins and needles" sensation.  Get help right away if:  You have difficulty speaking or moving.  You are always dizzy.  You faint.  You develop severe headaches.  You have weakness in your legs or arms.  You have changes in your hearing or vision.  You develop a stiff neck.  You develop sensitivity to light. This information is not intended to replace advice given to you by your health care provider. Make sure you discuss any questions you  have with your health care provider. Document Released: 06/11/2006 Document Revised: 02/09/2016 Document Reviewed: 12/27/2014  2017 Elsevier  Epley Maneuver Self-Care WHAT IS THE EPLEY MANEUVER? The Epley maneuver is an exercise you can do to relieve symptoms of benign paroxysmal positional vertigo (BPPV). This condition is often just referred to as vertigo. BPPV is caused by the movement of  tiny crystals (canaliths) inside your inner ear. The accumulation and movement of canaliths in your inner ear causes a sudden spinning sensation (vertigo) when you move your head to certain positions. Vertigo usually lasts about 30 seconds. BPPV usually occurs in just one ear. If you get vertigo when you lie on your left side, you probably have BPPV in your left ear. Your health care provider can tell you which ear is involved.   BPPV may be caused by a head injury. Many people older than 50 get BPPV for unknown reasons. If you have been diagnosed with BPPV, your health care provider may teach you how to do this maneuver. BPPV is not life threatening (benign) and usually goes away in time.   WHEN SHOULD I PERFORM THE EPLEY MANEUVER? You can do this maneuver at home whenever you have symptoms of vertigo. You may do the Epley maneuver up to 3 times a day until your symptoms of vertigo go away.  HOW SHOULD I DO THE EPLEY MANEUVER? 1. Sit on the edge of a bed or table with your back straight. Your legs should be extended or hanging over the edge of the bed or table.  2. Turn your head halfway toward the affected ear.  3. Lie backward quickly with your head turned until you are lying flat on your back. You may want to position a pillow under your shoulders.  4. Hold this position for 30 seconds. You may experience an attack of vertigo. This is normal. Hold this position until the vertigo stops. 5. Then turn your head to the opposite direction until your unaffected ear is facing the floor.  6. Hold this position for 30 seconds. You may experience an attack of vertigo. This is normal. Hold this position until the vertigo stops. 7. Now turn your whole body to the same side as your head. Hold for another 30 seconds.  8. You can then sit back up.  ARE THERE RISKS TO THIS MANEUVER? In some cases, you may have other symptoms (such as changes in your vision, weakness, or numbness). If you have these  symptoms, stop doing the maneuver and call your health care provider. Even if doing these maneuvers relieves your vertigo, you may still have dizziness. Dizziness is the sensation of light-headedness but without the sensation of movement. Even though the Epley maneuver may relieve your vertigo, it is possible that your symptoms will return within 5 years.  WHAT SHOULD I DO AFTER THIS MANEUVER? After doing the Epley maneuver, you can return to your normal activities. Ask your doctor if there is anything you should do at home to prevent vertigo. This may include:  Sleeping with two or more pillows to keep your head elevated.  Not sleeping on the side of your affected ear.  Getting up slowly from bed.  Avoiding sudden movements during the day.  Avoiding extreme head movement, like looking up or bending over.  Wearing a cervical collar to prevent sudden head movements.  WHAT SHOULD I DO IF MY SYMPTOMS GET WORSE? Call your health care provider if your vertigo gets worse. Call your provider right way if  you have other symptoms, including:   Nausea.  Vomiting.  Headache.  Weakness.  Numbness.  Vision changes.  This information is not intended to replace advice given to you by your health care provider. Make sure you discuss any questions you have with your health care provider. Document Released: 09/08/2013 Document Reviewed: 09/08/2013 Elsevier Interactive Patient Education  2017 [employer].

## 2016-08-29 NOTE — Progress Notes (Signed)
Office Visit Note   Patient: Javier Beasley           Date of Birth: 03/16/1966           MRN: [id number] Visit Date: 08/29/2016              Requested by: Lora PaulaJennifer T Krall, MD [address] [place], [place] [id number] PCP: Griffin BasilKrall, Jennifer, MD   Assessment & Plan: Visit Diagnoses:  1. Other spondylosis with radiculopathy, cervical region     Plan: Avoid overhead lifting and overhead use of the arms. Do not lift greater than 5 lbs. Adjust head rest in vehicle to prevent hyperextension if rear ended. Return appointment if needed. Will write order for saunders traction unit to use at home chronically.    Follow-Up Instructions: Return if symptoms worsen or fail to improve.   Orders:  No orders of the defined types were placed in this encounter.  No orders of the defined types were placed in this encounter.     Procedures: No procedures performed   Clinical Data: No additional findings.   Subjective: Chief Complaint  Patient presents with  . Neck - Pain, Follow-up    Patient is returning today to review MRI Cervical spine. Patient complains with right arm weakness, constant pain and numbness in fingers.     Review of Systems  HENT: Negative.   Eyes: Negative.   Respiratory: Negative.   Cardiovascular: Negative.   Gastrointestinal: Negative.   Endocrine: Negative.   Genitourinary: Negative.   Musculoskeletal: Positive for arthralgias, back pain, neck pain and neck stiffness.  Skin: Negative.   Allergic/Immunologic: Negative.   Neurological: Positive for weakness and numbness.  Hematological: Negative.   Psychiatric/Behavioral: Negative.      Objective: Vital Signs: BP (!) 136/98   Pulse 68   Ht 6\' 1"  (1.854 m)   Wt 200 lb (90.7 kg)   BMI 26.39 kg/m   Physical Exam  Constitutional: He is oriented to person, place, and time. He appears well-developed and well-nourished.  HENT:  Head: Normocephalic and atraumatic.  Eyes: EOM are normal. Pupils are  equal, round, and reactive to light.  Neck: Neck supple.  Pulmonary/Chest: Effort normal and breath sounds normal.  Abdominal: Soft. Bowel sounds are normal.  Musculoskeletal: Normal range of motion. He exhibits tenderness.  Neurological: He is oriented to person, place, and time.  Skin: Skin is warm and dry.    Back Exam   Tenderness  The patient is experiencing tenderness in the cervical.  Range of Motion  Extension: 40  Flexion: normal  Lateral Bend Right: normal  Lateral Bend Left: normal  Rotation Right: normal  Rotation Left: normal   Muscle Strength  Right Quadriceps:  5/5  Left Quadriceps:  5/5  Right Hamstrings:  5/5  Left Hamstrings:  5/5   Tests  Straight leg raise right: negative Straight leg raise left: negative  Reflexes  Patellar: normal Achilles: normal Biceps: normal Babinski's sign: normal   Other  Toe Walk: normal Heel Walk: normal Gait: normal  Erythema: no back redness Scars: absent      Specialty Comments:  No specialty comments available.  Imaging: No results found.   PMFS History: Patient Active Problem List   Diagnosis Date Noted  . Herniated cervical intervertebral disc 07/16/2016    Priority: High    Class: Chronic  . Cervical spine degeneration 05/11/2016  . Onychomycosis 05/11/2016  . Vitamin D deficiency 02/24/2016  . Acute meniscal tear of right knee 04/29/2015  .  Foot pain, left 01/20/2015  . Essential hypertension 09/21/2014  . Diabetes mellitus with stage 2 chronic kidney disease (HCC) 07/05/2014  . Health care maintenance 07/05/2014  . Labral tear of shoulder 02/11/2014  . HLD (hyperlipidemia) 11/03/2012  . CKD (chronic kidney disease) stage 2, GFR 60-89 ml/min 11/03/2012  . Esophagitis 10/24/2012  . Asthma 10/23/2012  . Chronic low back pain 11/12/2011   Past Medical History:  Diagnosis Date  . Allergy   . Asthma   . Bilateral chronic knee pain   . Chronic back pain   . Chronic shoulder pain   .  Depression   . Diabetes mellitus without complication (HCC)    diagnosed in 2015  . GERD (gastroesophageal reflux disease)   . History of herniated intervertebral disc   . Hx of cardiovascular stress test    a. Lexiscan Myoview 10/29/12:  EF 56%, no ischemia  . Hx of echocardiogram    a. Echo 10/24/12:  EF 60-65%, normal wall thickness, normal diastolic fxn  . Hyperlipidemia   . Hypertension   . Reflux   . Renal disorder    chronic kidney disease    Family History  Problem Relation Age of Onset  . Hypertension Other   . Diabetes Other   . Heart attack Other   . Diabetes Mother   . Hypertension Mother   . Hyperlipidemia Neg Hx   . Sudden death Neg Hx   . Hypertension Sister   . Stroke Sister   . Hypertension Brother   . Asthma Daughter   . Bladder Cancer Mother     Past Surgical History:  Procedure Laterality Date  . BALLOON DILATION N/A 10/24/2012   Procedure: BALLOON DILATION;  Surgeon: Willis ModenaWilliam Outlaw, MD;  [facility];  Service: Endoscopy;  Laterality: N/A;  . ESOPHAGOGASTRODUODENOSCOPY Left 10/24/2012   Procedure: ESOPHAGOGASTRODUODENOSCOPY (EGD);  Surgeon: Willis ModenaWilliam Outlaw, MD;  [facility];  Service: Endoscopy;  Laterality: Left;  possible balloon dilation  . KNEE ARTHROSCOPY WITH EXCISION PLICA  05/11/2015   Procedure: KNEE ARTHROSCOPY WITH EXCISION PLICA;  Surgeon: Tarry KosNaiping M Xu, MD;  Location: [facility];  Service: Orthopedics;;  . KNEE ARTHROSCOPY WITH LATERAL MENISECTOMY  05/11/2015   Procedure: KNEE ARTHROSCOPY WITH LATERAL MENISECTOMY;  Surgeon: Tarry KosNaiping M Xu, MD;  Location: [facility];  Service: Orthopedics;;  . SYNOVECTOMY Right 05/11/2015   Procedure: SYNOVECTOMY;  Surgeon: Tarry KosNaiping M Xu, MD;  Location: [facility];  Service: Orthopedics;  Laterality: Right;   Social History   Occupational History  . Not on file.   Social History Main Topics  . Smoking status: Never Smoker  . Smokeless tobacco: Never  Used  . Alcohol use 1.8 oz/week    3 Cans of beer per week     Comment: Beer.  . Drug use:     Types: Marijuana     Comment: daily MJ use for pain control.- smoked 1 mo ago  . Sexual activity: Not on file

## 2016-08-29 NOTE — Progress Notes (Signed)
   CC: dizziness  HPI:  [name] is a 50 year old man with history of DM2, asthma, HTN, HLD, chronic knee/shoulder/back pain presenting for evaluation of his dizziness.  Yesterday, started having dizziness. He was sitting on the couch. The room started spinning. He stood up and stumbled. Movement of head makes it worse. No headache. No vision changes. Never happened before. Had some cold symptoms a couple of weeks ago. No chest pain.  He has been out of his blood pressure medicine for 30 days.   Declined flu shot Declined pneumonia vaccine  Past Medical History:  Diagnosis Date  . Allergy   . Asthma   . Bilateral chronic knee pain   . Chronic back pain   . Chronic shoulder pain   . Depression   . Diabetes mellitus without complication (HCC)    diagnosed in 2015  . GERD (gastroesophageal reflux disease)   . History of herniated intervertebral disc   . Hx of cardiovascular stress test    a. Lexiscan Myoview 10/29/12:  EF 56%, no ischemia  . Hx of echocardiogram    a. Echo 10/24/12:  EF 60-65%, normal wall thickness, normal diastolic fxn  . Hyperlipidemia   . Hypertension   . Reflux   . Renal disorder    chronic kidney disease    Review of Systems:   No unintentional weight loss No fevers or chills No dysuria or hematuria  Physical Exam:  Vitals:   08/29/16 1059  BP: (!) 162/82  Pulse: 99  Temp: 98 F (36.7 C)  TempSrc: Oral  SpO2: 99%  Weight: 199 lb 12.8 oz (90.6 kg)   General Apperance: NAD HEENT: Normocephalic, atraumatic, anicteric sclera Neck: Supple, trachea midline Lungs: Clear to auscultation bilaterally. No wheezes, rhonchi or rales. Breathing comfortably Heart: Regular rate and rhythm, no murmur/rub/gallop Abdomen: Soft, nontender, nondistended, no rebound/guarding Extremities: Warm and well perfused, no edema Skin: No rashes or lesions Neurologic: Alert and interactive. No gross deficits.  Assessment & Plan:   See Encounters Tab for  problem based charting.  Patient discussed with Dr. Cyndie ChimeGranfortuna

## 2016-08-30 DIAGNOSIS — H8112 Benign paroxysmal vertigo, left ear: Secondary | ICD-10-CM | POA: Insufficient documentation

## 2016-08-30 NOTE — Progress Notes (Signed)
Medicine attending: Medical history, presenting problems, physical findings, and medications, reviewed with resident physician Dr Jennifer Krall on the day of the patient visit and I concur with her evaluation and management plan. 

## 2016-08-30 NOTE — Assessment & Plan Note (Signed)
Assessment: Diabetes remains controlled with hemoglobin A1c 7.2  Plan: Continue metformin 500 mg daily

## 2016-08-30 NOTE — Assessment & Plan Note (Signed)
Assessment: History consistent with acute onset vertigo. Nonsustained. No neurologic deficits making cerebral vascular etiology unlikely. No nystagmus at rest. Given that this is intermittent, brief episodes precipitated by head movements - probable benign paroxysmal positional vertigo. Positive Dix-Hallpike with vertigo elicited on left.  Plan: Discussed with patient Epley maneuver Will likely resolve spontaneously over days to weeks

## 2016-08-30 NOTE — Assessment & Plan Note (Signed)
Assessment: Blood pressure deteriorated to 162/82  Plan: Restart lisinopril 10 mg daily Follow-up in 3-4 weeks Will attempt to send future prescriptions for lisinopril to [facility]

## 2016-09-17 ENCOUNTER — Other Ambulatory Visit: Payer: Self-pay | Admitting: Pulmonary Disease

## 2016-09-17 DIAGNOSIS — I10 Essential (primary) hypertension: Secondary | ICD-10-CM

## 2016-11-12 ENCOUNTER — Ambulatory Visit (INDEPENDENT_AMBULATORY_CARE_PROVIDER_SITE_OTHER): Payer: No Typology Code available for payment source | Admitting: Pulmonary Disease

## 2016-11-12 ENCOUNTER — Other Ambulatory Visit: Payer: Self-pay | Admitting: Dietician

## 2016-11-12 VITALS — BP 125/62 | HR 67 | Temp 98.0°F | Ht 73.0 in | Wt 200.1 lb

## 2016-11-12 DIAGNOSIS — E1122 Type 2 diabetes mellitus with diabetic chronic kidney disease: Secondary | ICD-10-CM

## 2016-11-12 DIAGNOSIS — J45909 Unspecified asthma, uncomplicated: Secondary | ICD-10-CM

## 2016-11-12 DIAGNOSIS — I10 Essential (primary) hypertension: Secondary | ICD-10-CM

## 2016-11-12 DIAGNOSIS — Z7951 Long term (current) use of inhaled steroids: Secondary | ICD-10-CM

## 2016-11-12 DIAGNOSIS — K209 Esophagitis, unspecified without bleeding: Secondary | ICD-10-CM

## 2016-11-12 DIAGNOSIS — Z7984 Long term (current) use of oral hypoglycemic drugs: Secondary | ICD-10-CM

## 2016-11-12 DIAGNOSIS — I129 Hypertensive chronic kidney disease with stage 1 through stage 4 chronic kidney disease, or unspecified chronic kidney disease: Secondary | ICD-10-CM

## 2016-11-12 DIAGNOSIS — Z79899 Other long term (current) drug therapy: Secondary | ICD-10-CM

## 2016-11-12 DIAGNOSIS — N182 Chronic kidney disease, stage 2 (mild): Principal | ICD-10-CM

## 2016-11-12 LAB — HM DIABETES EYE EXAM

## 2016-11-12 MED ORDER — ALBUTEROL SULFATE HFA 108 (90 BASE) MCG/ACT IN AERS: 2.0000 | INHALATION_SPRAY | Freq: Four times a day (QID) | RESPIRATORY_TRACT | 0 refills | Status: DC | PRN

## 2016-11-12 MED ORDER — MOMETASONE FURO-FORMOTEROL FUM 200-5 MCG/ACT IN AERO: 1.0000 | INHALATION_SPRAY | Freq: Two times a day (BID) | RESPIRATORY_TRACT | 11 refills | Status: DC

## 2016-11-12 MED ORDER — MOMETASONE FURO-FORMOTEROL FUM 200-5 MCG/ACT IN AERO: 1.0000 | INHALATION_SPRAY | Freq: Two times a day (BID) | RESPIRATORY_TRACT | 0 refills | Status: DC

## 2016-11-12 MED ORDER — BUDESONIDE-FORMOTEROL FUMARATE 160-4.5 MCG/ACT IN AERO: 2.0000 | INHALATION_SPRAY | Freq: Every day | RESPIRATORY_TRACT | 0 refills | Status: DC

## 2016-11-12 MED FILL — VENTOLIN HFA 90 MCG INHALER: 108 (90 BAS | 25 days supply | Qty: 18 | Fill #0

## 2016-11-12 MED FILL — SYMBICORT 160-4.5 MCG INH: 160-4.5 | 30 days supply | Qty: 10 | Fill #0

## 2016-11-12 NOTE — Progress Notes (Signed)
   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 man with DM2, asthma, HTN, HLD, chronic knee/shoulder/back pain presenting for follow up of his hypertension.  He is doing well. He had an episode of lower abdominal pain about a week ago that lasted a few days and resolved spontaneously. None since. Denies constipation. Uncertain if it was a food trigger.   Past Medical History:  Diagnosis Date  . Allergy   . Asthma   . Bilateral chronic knee pain   . Chronic back pain   . Chronic shoulder pain   . Depression   . Diabetes mellitus without complication (HCC)    diagnosed in 2015  . GERD (gastroesophageal reflux disease)   . History of herniated intervertebral disc   . Hx of cardiovascular stress test    a. Lexiscan Myoview 10/29/12:  EF 56%, no ischemia  . Hx of echocardiogram    a. Echo 10/24/12:  EF 60-65%, normal wall thickness, normal diastolic fxn  . Hyperlipidemia   . Hypertension   . Reflux   . Renal disorder    chronic kidney disease    Review of Systems:   No fevers or chills No chest pain  Physical Exam:  Vitals:   11/12/16 1018  BP: 125/62  Pulse: 67  Temp: 98 F (36.7 C)  TempSrc: Oral  SpO2: 100%  Weight: 200 lb 1.6 oz (90.8 kg)  Height: 6\' 1"  (1.854 m)   General Apperance: NAD HEENT: Normocephalic, atraumatic, anicteric sclera Neck: Supple, trachea midline Lungs: Clear to auscultation bilaterally. No wheezes, rhonchi or rales. Breathing comfortably Heart: Regular rate and rhythm, no murmur/rub/gallop Abdomen: Soft, nontender, nondistended, no rebound/guarding Extremities: Warm and well perfused, no edema Skin: No rashes or lesions Neurologic: Alert and interactive. No gross deficits.  Assessment & Plan:   See Encounters Tab for problem based charting.  Patient discussed with Dr. Cleda DaubE. Hoffman

## 2016-11-12 NOTE — Assessment & Plan Note (Addendum)
Retinal exam and foot exam done today. Follow up in 4-6 months for A1c and BMP

## 2016-11-12 NOTE — Addendum Note (Signed)
Addended by: Mliss FritzKIM, Arley Salamone J on: 11/12/2016 11:34 AM   Modules accepted: Orders

## 2016-11-12 NOTE — Patient Instructions (Signed)
Follow-up in 4-6 months

## 2016-11-12 NOTE — Assessment & Plan Note (Signed)
Doing well on Dulera BID and albuterol as needed. Will work on medication assistance so he can continue these.

## 2016-11-12 NOTE — Assessment & Plan Note (Signed)
Assessment: BP at goal today 125/62  Plan: Continue lisinopril 10mg  daily

## 2016-11-12 NOTE — Assessment & Plan Note (Signed)
Has not been able to afford pantoprazole. Recommended over the counter Prilosec or Nexium.

## 2016-11-13 ENCOUNTER — Other Ambulatory Visit: Payer: Self-pay | Admitting: Pharmacist

## 2016-11-13 DIAGNOSIS — E785 Hyperlipidemia, unspecified: Secondary | ICD-10-CM

## 2016-11-13 DIAGNOSIS — N182 Chronic kidney disease, stage 2 (mild): Secondary | ICD-10-CM

## 2016-11-13 DIAGNOSIS — J45909 Unspecified asthma, uncomplicated: Secondary | ICD-10-CM

## 2016-11-13 DIAGNOSIS — I10 Essential (primary) hypertension: Secondary | ICD-10-CM

## 2016-11-13 DIAGNOSIS — E1122 Type 2 diabetes mellitus with diabetic chronic kidney disease: Secondary | ICD-10-CM

## 2016-11-13 MED ORDER — LISINOPRIL 10 MG PO TABS: 10.0000 mg | ORAL_TABLET | Freq: Every day | ORAL | 0 refills | Status: DC

## 2016-11-13 MED ORDER — ALBUTEROL SULFATE HFA 108 (90 BASE) MCG/ACT IN AERS: 2.0000 | INHALATION_SPRAY | Freq: Four times a day (QID) | RESPIRATORY_TRACT | 3 refills | Status: DC | PRN

## 2016-11-13 MED ORDER — ATORVASTATIN CALCIUM 80 MG PO TABS: 80.0000 mg | ORAL_TABLET | Freq: Every day | ORAL | 3 refills | Status: DC

## 2016-11-13 MED ORDER — MOMETASONE FURO-FORMOTEROL FUM 200-5 MCG/ACT IN AERO: 1.0000 | INHALATION_SPRAY | Freq: Two times a day (BID) | RESPIRATORY_TRACT | 11 refills | Status: DC

## 2016-11-13 NOTE — Progress Notes (Signed)
[facility], prescriptions transferred.

## 2016-11-15 ENCOUNTER — Encounter: Payer: Self-pay | Admitting: Dietician

## 2016-11-15 NOTE — Progress Notes (Signed)
Internal Medicine Clinic Attending  Case discussed with Dr. Krall soon after the resident saw the patient.  We reviewed the resident's history and exam and pertinent patient test results.  I agree with the assessment, diagnosis, and plan of care documented in the resident's note. 

## 2017-03-04 ENCOUNTER — Encounter: Payer: Self-pay | Admitting: *Deleted

## 2017-05-01 ENCOUNTER — Other Ambulatory Visit: Payer: Self-pay | Admitting: *Deleted

## 2017-05-01 ENCOUNTER — Encounter: Payer: Self-pay | Admitting: Internal Medicine

## 2017-05-01 ENCOUNTER — Ambulatory Visit (INDEPENDENT_AMBULATORY_CARE_PROVIDER_SITE_OTHER): Payer: Self-pay | Admitting: Internal Medicine

## 2017-05-01 VITALS — BP 150/87 | HR 60 | Temp 98.1°F | Wt 200.8 lb

## 2017-05-01 DIAGNOSIS — Z79899 Other long term (current) drug therapy: Secondary | ICD-10-CM

## 2017-05-01 DIAGNOSIS — K209 Esophagitis, unspecified without bleeding: Secondary | ICD-10-CM

## 2017-05-01 DIAGNOSIS — K200 Eosinophilic esophagitis: Secondary | ICD-10-CM

## 2017-05-01 DIAGNOSIS — E785 Hyperlipidemia, unspecified: Secondary | ICD-10-CM

## 2017-05-01 DIAGNOSIS — I129 Hypertensive chronic kidney disease with stage 1 through stage 4 chronic kidney disease, or unspecified chronic kidney disease: Secondary | ICD-10-CM

## 2017-05-01 DIAGNOSIS — I10 Essential (primary) hypertension: Secondary | ICD-10-CM

## 2017-05-01 DIAGNOSIS — N182 Chronic kidney disease, stage 2 (mild): Secondary | ICD-10-CM

## 2017-05-01 DIAGNOSIS — Z9114 Patient's other noncompliance with medication regimen: Secondary | ICD-10-CM

## 2017-05-01 DIAGNOSIS — E559 Vitamin D deficiency, unspecified: Secondary | ICD-10-CM

## 2017-05-01 DIAGNOSIS — Z7984 Long term (current) use of oral hypoglycemic drugs: Secondary | ICD-10-CM

## 2017-05-01 DIAGNOSIS — E1122 Type 2 diabetes mellitus with diabetic chronic kidney disease: Secondary | ICD-10-CM

## 2017-05-01 LAB — POCT GLYCOSYLATED HEMOGLOBIN (HGB A1C): HEMOGLOBIN A1C: 7.1

## 2017-05-01 LAB — GLUCOSE, CAPILLARY: GLUCOSE-CAPILLARY: 121 mg/dL — AB (ref 65–99)

## 2017-05-01 MED ORDER — LISINOPRIL 10 MG PO TABS: 10.0000 mg | ORAL_TABLET | Freq: Every day | ORAL | 1 refills | Status: DC

## 2017-05-01 MED ORDER — METFORMIN HCL ER 500 MG PO TB24: 500.0000 mg | ORAL_TABLET | Freq: Every day | ORAL | 1 refills | Status: DC

## 2017-05-01 NOTE — Patient Instructions (Signed)
Mr. Javier AstersWhite it was nice meeting you today.  -I have referred you to gastroenterology.  -Continue taking metformin as instructed for diabetes.  -Continue taking lisinopril as instructed for high blood pressure.  -Return to the clinic in 4 weeks for a follow-up.

## 2017-05-01 NOTE — Progress Notes (Signed)
[facility] [address] as pt no longer qualifies to use [facility] dept pharamacy.Kingsley SpittleGoldston, Darlene Cassady8/15/201812:29 PM

## 2017-05-02 ENCOUNTER — Encounter: Payer: Self-pay | Admitting: Internal Medicine

## 2017-05-02 LAB — LIPID PANEL
CHOLESTEROL TOTAL: 227 mg/dL — AB (ref 100–199)
Chol/HDL Ratio: 4.3 ratio (ref 0.0–5.0)
HDL: 53 mg/dL (ref >39)
LDL CALC: 146 mg/dL — AB (ref 0–99)
TRIGLYCERIDES: 141 mg/dL (ref 0–149)
VLDL CHOLESTEROL CAL: 28 mg/dL (ref 5–40)

## 2017-05-02 LAB — BMP8+ANION GAP
Anion Gap: 16 mmol/L (ref 10.0–18.0)
BUN / CREAT RATIO: 8 — AB (ref 9–20)
BUN: 10 mg/dL (ref 6–24)
CALCIUM: 9.4 mg/dL (ref 8.7–10.2)
CO2: 23 mmol/L (ref 20–29)
CREATININE: 1.19 mg/dL (ref 0.76–1.27)
Chloride: 99 mmol/L (ref 96–106)
GFR calc non Af Amer: 70 mL/min/{1.73_m2} (ref >59)
GFR, EST AFRICAN AMERICAN: 81 mL/min/{1.73_m2} (ref >59)
Glucose: 105 mg/dL — ABNORMAL HIGH (ref 65–99)
Potassium: 4.3 mmol/L (ref 3.5–5.2)
Sodium: 138 mmol/L (ref 134–144)

## 2017-05-02 LAB — VITAMIN D 25 HYDROXY (VIT D DEFICIENCY, FRACTURES): Vit D, 25-Hydroxy: 11.3 ng/mL — ABNORMAL LOW (ref 30.0–100.0)

## 2017-05-02 MED ORDER — ROSUVASTATIN CALCIUM 20 MG PO TABS: 20.0000 mg | ORAL_TABLET | Freq: Every day | ORAL | 3 refills | Status: DC

## 2017-05-02 MED ORDER — LISINOPRIL 10 MG PO TABS: 10.0000 mg | ORAL_TABLET | Freq: Every day | ORAL | 1 refills | Status: DC

## 2017-05-02 MED ORDER — VITAMIN D (ERGOCALCIFEROL) 1.25 MG (50000 UNIT) PO CAPS: 50000.0000 [IU] | ORAL_CAPSULE | ORAL | 0 refills | Status: DC

## 2017-05-02 MED ORDER — METFORMIN HCL ER 500 MG PO TB24: 500.0000 mg | ORAL_TABLET | Freq: Every day | ORAL | 1 refills | Status: DC

## 2017-05-02 NOTE — Assessment & Plan Note (Signed)
Assessment Well-controlled type 2 diabetes. A1c 7.1. Medication regimen includes metformin XR 500 mg daily. Patient reports noncompliance.  Plan -Continue metformin -Emphasized the importance of medication compliance. -Recheck A1c in 3 months

## 2017-05-02 NOTE — Assessment & Plan Note (Addendum)
Assessment Uncontrolled hypertension. Medication regimen includes lisinopril 10 mg daily. Patient reports noncompliance with medications. Labs showing creatinine 1.1 at baseline and GFR 81.  Plan -Continue lisinopril -Emphasized the importance of medication compliance.

## 2017-05-02 NOTE — Progress Notes (Signed)
   CC: Patient is here for a regular follow-up. Dysphagia, diabetes, hypertension, hyperlipidemia, and vitamin D deficiency were discussed during this visit.  HPI:  Mr.Graysen Rueben BashM Hoctor is a 51 y.o. male with a past medical history of conditions listed below presenting to the clinic for a regular follow-up. Dysphagia, diabetes, hypertension, hyperlipidemia, and vitamin D deficiency were discussing this visit. Please see problem based charting for the status of the patient's current and chronic medical conditions.   Past Medical History:  Diagnosis Date  . Allergy   . Asthma   . Bilateral chronic knee pain   . Chronic back pain   . Chronic shoulder pain   . Depression   . Diabetes mellitus without complication (HCC)    diagnosed in 2015  . GERD (gastroesophageal reflux disease)   . History of herniated intervertebral disc   . Hx of cardiovascular stress test    a. Lexiscan Myoview 10/29/12:  EF 56%, no ischemia  . Hx of echocardiogram    a. Echo 10/24/12:  EF 60-65%, normal wall thickness, normal diastolic fxn  . Hyperlipidemia   . Hypertension   . Reflux   . Renal disorder    chronic kidney disease   Review of Systems: Pertinent positives mentioned in HPI. Remainder of all ROS negative.   Physical Exam:  Vitals:   05/01/17 1111  BP: (!) 150/87  Pulse: 60  Temp: 98.1 F (36.7 C)  TempSrc: Oral  SpO2: 100%  Weight: 200 lb 12.8 oz (91.1 kg)   Physical Exam  Constitutional: He is oriented to person, place, and time. He appears well-developed and well-nourished. No distress.  HENT:  Head: Normocephalic and atraumatic.  Eyes: Right eye exhibits no discharge. Left eye exhibits no discharge.  Cardiovascular: Normal rate, regular rhythm and intact distal pulses.   Pulmonary/Chest: Effort normal and breath sounds normal. No respiratory distress. He has no wheezes. He has no rales.  Abdominal: Soft. Bowel sounds are normal. He exhibits no distension. There is no tenderness.    Musculoskeletal: He exhibits no edema.  Neurological: He is alert and oriented to person, place, and time.  Skin: Skin is warm and dry.    Assessment & Plan:   See Encounters Tab for problem based charting.  Patient discussed with Dr. Heide SparkNarendra

## 2017-05-02 NOTE — Assessment & Plan Note (Addendum)
Assessment Patient states he stopped taking Lipitor in the past because he is having cramps all over. Lipid panel at this visit showing cholesterol 227, triglycerides 141, HDL 53, and LDL 146. His 10 year ASCVD risk score is 22.4%.  Plan -Start high-intensity hydrophilic statin: Rosuvastatin 20 mg daily

## 2017-05-02 NOTE — Addendum Note (Signed)
Addended by: John GiovanniATHORE, Sindia Kowalczyk on: 05/02/2017 05:56 PM   Modules accepted: Orders

## 2017-05-02 NOTE — Assessment & Plan Note (Signed)
Assessment  Patient has a history of dysphagia. EGD done in February 2014 showing eosinophilic esophagitis. He is complaining of occasional sensation of food getting stuck in his esophagus and reports having associated "piercing" substernal chest pain whenever this happens. Chest pain is nonexertional and is not associated with shortness of breath or diaphoresis. Patient had a cardiac workup in the past including echo and stress perfusion study in 10/2012 and cardiology thought his CP was GI related. His dysphagia and odynophagia could possibly be due to recurrence of his eosinophilic esophagitis. Less likely to be due to malignancy as his weight has been stable.  Plan -Referral to GI for repeat EGD

## 2017-05-02 NOTE — Assessment & Plan Note (Addendum)
Assessment Vitamin D level low at 11.3.  Plan -Ergocalciferol 50,000 units weekly -Recheck vitamin D level in 6-8 weeks

## 2017-05-03 ENCOUNTER — Other Ambulatory Visit: Payer: Self-pay | Admitting: *Deleted

## 2017-05-03 NOTE — Telephone Encounter (Signed)
Vit D called in to [facility].

## 2017-05-03 NOTE — Progress Notes (Signed)
Internal Medicine Clinic Attending  Case discussed with Dr. Rathoreat the time of the visit. We reviewed the resident's history and exam and pertinent patient test results. I agree with the assessment, diagnosis, and plan of care documented in the resident's note.  

## 2017-05-21 ENCOUNTER — Ambulatory Visit: Payer: Self-pay

## 2017-05-30 ENCOUNTER — Encounter: Payer: Self-pay | Admitting: *Deleted

## 2017-07-01 ENCOUNTER — Encounter: Payer: Self-pay | Admitting: *Deleted

## 2017-07-09 ENCOUNTER — Ambulatory Visit: Payer: Self-pay

## 2017-07-25 ENCOUNTER — Encounter: Payer: Self-pay | Admitting: Internal Medicine

## 2017-07-25 ENCOUNTER — Ambulatory Visit (INDEPENDENT_AMBULATORY_CARE_PROVIDER_SITE_OTHER): Payer: Self-pay | Admitting: Internal Medicine

## 2017-07-25 VITALS — BP 143/81 | HR 67 | Temp 98.0°F | Wt 201.0 lb

## 2017-07-25 DIAGNOSIS — E785 Hyperlipidemia, unspecified: Secondary | ICD-10-CM

## 2017-07-25 DIAGNOSIS — Z7984 Long term (current) use of oral hypoglycemic drugs: Secondary | ICD-10-CM

## 2017-07-25 DIAGNOSIS — E559 Vitamin D deficiency, unspecified: Secondary | ICD-10-CM

## 2017-07-25 DIAGNOSIS — N182 Chronic kidney disease, stage 2 (mild): Secondary | ICD-10-CM

## 2017-07-25 DIAGNOSIS — R109 Unspecified abdominal pain: Secondary | ICD-10-CM

## 2017-07-25 DIAGNOSIS — E1122 Type 2 diabetes mellitus with diabetic chronic kidney disease: Secondary | ICD-10-CM

## 2017-07-25 DIAGNOSIS — Z7951 Long term (current) use of inhaled steroids: Secondary | ICD-10-CM

## 2017-07-25 DIAGNOSIS — M545 Low back pain: Secondary | ICD-10-CM

## 2017-07-25 DIAGNOSIS — I129 Hypertensive chronic kidney disease with stage 1 through stage 4 chronic kidney disease, or unspecified chronic kidney disease: Secondary | ICD-10-CM

## 2017-07-25 DIAGNOSIS — K219 Gastro-esophageal reflux disease without esophagitis: Secondary | ICD-10-CM

## 2017-07-25 DIAGNOSIS — I10 Essential (primary) hypertension: Secondary | ICD-10-CM

## 2017-07-25 DIAGNOSIS — Z79899 Other long term (current) drug therapy: Secondary | ICD-10-CM

## 2017-07-25 DIAGNOSIS — I2510 Atherosclerotic heart disease of native coronary artery without angina pectoris: Secondary | ICD-10-CM

## 2017-07-25 DIAGNOSIS — Z9889 Other specified postprocedural states: Secondary | ICD-10-CM

## 2017-07-25 DIAGNOSIS — R252 Cramp and spasm: Secondary | ICD-10-CM

## 2017-07-25 DIAGNOSIS — J45909 Unspecified asthma, uncomplicated: Secondary | ICD-10-CM

## 2017-07-25 DIAGNOSIS — G8929 Other chronic pain: Secondary | ICD-10-CM

## 2017-07-25 LAB — POCT GLYCOSYLATED HEMOGLOBIN (HGB A1C): Hemoglobin A1C: 6.8

## 2017-07-25 LAB — GLUCOSE, CAPILLARY: GLUCOSE-CAPILLARY: 219 mg/dL — AB (ref 65–99)

## 2017-07-25 MED ORDER — ALBUTEROL SULFATE HFA 108 (90 BASE) MCG/ACT IN AERS: 2.0000 | INHALATION_SPRAY | Freq: Four times a day (QID) | RESPIRATORY_TRACT | 3 refills | Status: DC | PRN

## 2017-07-25 NOTE — Assessment & Plan Note (Addendum)
Assessment Vitamin D level was low at last visit. He was prescribed ergocalciferol 50,000 units weekly. He does report some intermittent muscle cramps.  Plan - Recheck vitamin D level today (Will send new prescription if still low)

## 2017-07-25 NOTE — Patient Instructions (Addendum)
It was a pleasure to meet you today, [name].  Your diabetes was well-controlled today. Your A1c today was 6.8. Keep up the good work! I've included some information below about what foods/drinks to eat/avoid.  I have refilled your albuterol inhaler and [facility]. Please use your Elwin SleightDulera twice a day and use your albuterol inhaler as needed for breakthrough symptoms.  We are checking your vitamin D level today. I will call you with the results and send in a prescription for vitamin D if needed.  We are checking your kidney and liver function today as well. We will work on getting your GI appointment rescheduled.  Please see me back in 3 months or sooner if needed.    Diabetes Mellitus and Food It is important for you to manage your blood sugar (glucose) level. Your blood glucose level can be greatly affected by what you eat. Eating healthier foods in the appropriate amounts throughout the day at about the same time each day will help you control your blood glucose level. It can also help slow or prevent worsening of your diabetes mellitus. Healthy eating may even help you improve the level of your blood pressure and reach or maintain a healthy weight. General recommendations for healthful eating and cooking habits include:  Eating meals and snacks regularly. Avoid going long periods of time without eating to lose weight.  Eating a diet that consists mainly of plant-based foods, such as fruits, vegetables, nuts, legumes, and whole grains.  Using low-heat cooking methods, such as baking, instead of high-heat cooking methods, such as deep frying.  Work with your dietitian to make sure you understand how to use the Nutrition Facts information on food labels. How can food affect me? Carbohydrates Carbohydrates affect your blood glucose level more than any other type of food. Your dietitian will help you determine how many carbohydrates to eat  at each meal and teach you how to count carbohydrates. Counting carbohydrates is important to keep your blood glucose at a healthy level, especially if you are using insulin or taking certain medicines for diabetes mellitus. Alcohol Alcohol can cause sudden decreases in blood glucose (hypoglycemia), especially if you use insulin or take certain medicines for diabetes mellitus. Hypoglycemia can be a life-threatening condition. Symptoms of hypoglycemia (sleepiness, dizziness, and disorientation) are similar to symptoms of having too much alcohol. If your health care provider has given you approval to drink alcohol, do so in moderation and use the following guidelines:  Women should not have more than one drink per day, and men should not have more than two drinks per day. One drink is equal to: ? 12 oz of beer. ? 5 oz of wine. ? 1 oz of hard liquor.  Do not drink on an empty stomach.  Keep yourself hydrated. Have water, diet soda, or unsweetened iced tea.  Regular soda, juice, and other mixers might contain a lot of carbohydrates and should be counted.  What foods are not recommended? As you make food choices, it is important to remember that all foods are not the same. Some foods have fewer nutrients per serving than other foods, even though they might have the same number of calories or carbohydrates. It is difficult to get your body what it needs when you eat foods with fewer nutrients. Examples of foods that you should avoid that are high in calories and carbohydrates but low in nutrients include:  Trans fats (most processed foods list trans  fats on the Nutrition Facts label).  Regular soda.  Juice.  Candy.  Sweets, such as cake, pie, doughnuts, and cookies.  Fried foods.  What foods can I eat? Eat nutrient-rich foods, which will nourish your body and keep you healthy. The food you should eat also will depend on several factors, including:  The calories you need.  The medicines  you take.  Your weight.  Your blood glucose level.  Your blood pressure level.  Your cholesterol level.  You should eat a variety of foods, including:  Protein. ? Lean cuts of meat. ? Proteins low in saturated fats, such as fish, egg whites, and beans. Avoid processed meats.  Fruits and vegetables. ? Fruits and vegetables that may help control blood glucose levels, such as apples, mangoes, and yams.  Dairy products. ? Choose fat-free or low-fat dairy products, such as milk, yogurt, and cheese.  Grains, bread, pasta, and rice. ? Choose whole grain products, such as multigrain bread, whole oats, and brown rice. These foods may help control blood pressure.  Fats. ? Foods containing healthful fats, such as nuts, avocado, olive oil, canola oil, and fish.  Does everyone with diabetes mellitus have the same meal plan? Because every person with diabetes mellitus is different, there is not one meal plan that works for everyone. It is very important that you meet with a dietitian who will help you create a meal plan that is just right for you. This information is not intended to replace advice given to you by your health care provider. Make sure you discuss any questions you have with your health care provider. Document Released: 05/31/2005 Document Revised: 02/09/2016 Document Reviewed: 07/31/2013 Elsevier Interactive Patient Education  2017 [employer]

## 2017-07-25 NOTE — Assessment & Plan Note (Addendum)
Assessment Blood pressure today was 143/81. On lisinopril 10 mg daily. He reports improved compliance. Misses approximately 1-2 doses per week. I encouraged him to set a reminder on his phone to help him remember to take his medication. He states he is planning on getting a pillbox that he will set in his bathroom to help him remember.  Plan - Continue lisinopril - Encouraged compliance - CMET today (Cr previously stable at 1.1) - Follow up in 3 months

## 2017-07-25 NOTE — Progress Notes (Signed)
Medicine attending: I personally interviewed and briefly examined this patient on the day of the patient visit and reviewed pertinent clinical  data  with resident physician Dr. Jeannette CorpusJennifer Haung and we discussed a management plan. Unexplained intermittent intense abdominal cramping - no relation to eating; no tenesmus. Benign exam. Semilunar scar below navel from hernia surgery as a child. Consider intermittent obstruction from adhesions but he denies any constipation/diarhea. Imaging unlikely to yield answer. We will get GI opinion before indiscriminately ordering imaging studies.

## 2017-07-25 NOTE — Assessment & Plan Note (Signed)
Assessment Patient has been using his albuterol inhaler twice a day. He is on Dulera and albuterol. I advised him to use his Dulera twice a day every day and the albuterol inhaler only as needed.  Plan - Dulera BID - Refilled albuterol inhaler to be used PRN

## 2017-07-25 NOTE — Assessment & Plan Note (Addendum)
Assessment A1c today was 6.8. Currently on metformin XR 500 mg daily. No hypoglycemic episodes. He states that he drinks a lot of sweet tea and does not like water.  Plan - Continue metformin - Counseled patient on better nutrition options, specifically staying away from sweet tea, juices, and other regular sodas. Patient endorsed understanding.

## 2017-07-25 NOTE — Assessment & Plan Note (Signed)
Assessment Right and left lower back cramping pain. Tender to palpation along paraspinal muscles. No CVA tenderness. No dysuria or hematuria. No numbness. The cramps are intermittent and are worse after walking and being up for 10-20 minutes. Stretching does help. He has tried heating pads and ibuprofen, both of which are helpful.  Plan - Continue NSAIDs and heat PRN

## 2017-07-25 NOTE — Progress Notes (Addendum)
CC: management of HTN and diabetes  HPI:  Javier Beasley is a 51 y.o. with PMH significant for type 2 diabetes, hypertension, hyperlipidemia, vitamin D deficiency, CAD, asthma, and GERD who presents for management of HTN and diabetes.  He reports intermittent sharp "knife-like" abdominal pain that lasts approximately 1 minute. There is no pattern to the pain. It is not increased after eating and it is not decreased after a bowel movement. This has been going on for approximately 2 months. The episodes are increasing in frequency. Denies nausea, vomiting, diarrhea, or constipation. Denies hematochezia. He does have a horizontal 2 cm well-healed scar just below his belly button. He does not remember what surgery he had. He states that it was from when he was a baby. It may have been a hernia. He did have a referral to see GI (Dr. Dulce Sellarutlaw), however he was not able to make this appointment.  Please see the assessment and plan below for the status of the patient's chronic medical problems.  Past Medical History:  Diagnosis Date  . Allergy   . Asthma   . Bilateral chronic knee pain   . Chronic back pain   . Chronic shoulder pain   . Depression   . Diabetes mellitus without complication (HCC)    diagnosed in 2015  . GERD (gastroesophageal reflux disease)   . History of herniated intervertebral disc   . Hx of cardiovascular stress test    a. Lexiscan Myoview 10/29/12:  EF 56%, no ischemia  . Hx of echocardiogram    a. Echo 10/24/12:  EF 60-65%, normal wall thickness, normal diastolic fxn  . Hyperlipidemia   . Hypertension   . Reflux   . Renal disorder    chronic kidney disease   Review of Systems:   Constitutional: Negative for diaphoresis, fever, malaise/fatigue, and weight loss. HEENT: Negative for blurred vision, hearing loss, sinus pain, congestion, and sore throat. Respiratory: Negative for cough, shortness of breath and wheezing. Cardiovascular: Negative for chest pain,  palpitations, and leg swelling. Gastrointestinal: Negative for blood in stool, constipation, diarrhea, heartburn, nausea and vomiting. Positive for intermittent abdominal pain. Genitourinary: Negative for dysuria and hematuria.  Musculoskeletal: Negative for joint pain and occasional cramps and cramping back pain. Neurological: Negative for dizziness, focal weakness, weakness and headaches.  Social Hx:  - Denies smoking or illicit drugs - Drinks a 6pack of beer per week - Works as a Engineer, agriculturalbasketball coach and volunteer; used to play basketball in college [employer]([address]  Physical Exam: Vitals:   07/25/17 1344  BP: (!) 143/81  Pulse: 67  Temp: 98 F (36.7 C)  TempSrc: Oral  SpO2: 99%  Weight: 201 lb (91.2 kg)    GEN: Well-appearing, well-nourished. Alert and oriented. No acute distress. HENT: [place]/AT. Moist mucous membranes. No visible lesions. EYES: PERRL. Sclera non-icteric. Conjunctiva clear. RESP: Clear to auscultation bilaterally. No wheezes, rales, or rhonchi. No increased work of breathing. CV: Normal rate and regular rhythm. 1/6 mid-systolic murmur, best heard at the apex. No carotid bruits. No LE edema. ABD: Soft. Mild TTP in lower abdomen; otherwise non-tender. Non-distended. Normoactive bowel sounds. No rebound or guarding. No hepatomegaly or splenomegaly. Well-healed 2cm horizontal scar just below the navel. EXT: No edema. Warm and well perfused. NEURO: Cranial nerves II-XII grossly intact. Able to lift all four extremities against gravity. No apparent audiovisual hallucinations. Speech fluent and appropriate. PSYCH: Patient is calm and pleasant. Appropriate affect. Well-groomed; speech is appropriate and on-subject.  Assessment & Plan:  Stomach pain x621mo Had some kind of abdominal surgery as a baby, but cannot remember what it was. With a history of abdominal surgery and what seems like "contracting" stomach pain, he may have adhesions. He does not have any N/V/D/C and there is  no pattern to the pain relating to bowel movements or food intake, which makes intestinal spasms and PUD less likely. Denies hematochezia or history of peptic ulcer disease. No hepatomegaly or splenomegaly on exam. - Check CMET today - Can consider abdominal ultrasound if worsening abdominal pain - Referral for GI placed  See Encounters Tab for problem based charting.  Patient seen with Dr. Cyndie ChimeGranfortuna

## 2017-07-26 LAB — CMP14 + ANION GAP
A/G RATIO: 2 (ref 1.2–2.2)
ALK PHOS: 72 IU/L (ref 39–117)
ALT: 22 IU/L (ref 0–44)
AST: 25 IU/L (ref 0–40)
Albumin: 4.7 g/dL (ref 3.5–5.5)
Anion Gap: 16 mmol/L (ref 10.0–18.0)
BILIRUBIN TOTAL: 0.3 mg/dL (ref 0.0–1.2)
BUN / CREAT RATIO: 8 — AB (ref 9–20)
BUN: 10 mg/dL (ref 6–24)
CALCIUM: 9.5 mg/dL (ref 8.7–10.2)
CO2: 26 mmol/L (ref 20–29)
CREATININE: 1.23 mg/dL (ref 0.76–1.27)
Chloride: 102 mmol/L (ref 96–106)
GFR, EST AFRICAN AMERICAN: 78 mL/min/{1.73_m2} (ref >59)
GFR, EST NON AFRICAN AMERICAN: 68 mL/min/{1.73_m2} (ref >59)
GLUCOSE: 127 mg/dL — AB (ref 65–99)
Globulin, Total: 2.4 g/dL (ref 1.5–4.5)
POTASSIUM: 4 mmol/L (ref 3.5–5.2)
SODIUM: 144 mmol/L (ref 134–144)
Total Protein: 7.1 g/dL (ref 6.0–8.5)

## 2017-07-26 LAB — VITAMIN D 25 HYDROXY (VIT D DEFICIENCY, FRACTURES): Vit D, 25-Hydroxy: 39 ng/mL (ref 30.0–100.0)

## 2017-08-05 ENCOUNTER — Other Ambulatory Visit: Payer: Self-pay | Admitting: *Deleted

## 2017-08-12 NOTE — Telephone Encounter (Signed)
error 

## 2017-08-23 ENCOUNTER — Other Ambulatory Visit: Payer: Self-pay | Admitting: Gastroenterology

## 2017-08-23 DIAGNOSIS — R109 Unspecified abdominal pain: Secondary | ICD-10-CM

## 2017-09-02 ENCOUNTER — Ambulatory Visit
Admission: RE | Admit: 2017-09-02 | Discharge: 2017-09-02 | Disposition: A | Discharge status: Home / Self Care | Payer: Self-pay | Source: Ambulatory Visit | Attending: Gastroenterology | Admitting: Gastroenterology

## 2017-09-02 ENCOUNTER — Other Ambulatory Visit: Payer: No Typology Code available for payment source

## 2017-09-02 DIAGNOSIS — R109 Unspecified abdominal pain: Secondary | ICD-10-CM

## 2017-09-06 ENCOUNTER — Other Ambulatory Visit (INDEPENDENT_AMBULATORY_CARE_PROVIDER_SITE_OTHER): Payer: No Typology Code available for payment source

## 2017-09-06 DIAGNOSIS — R197 Diarrhea, unspecified: Secondary | ICD-10-CM

## 2017-09-11 LAB — CMP14 + ANION GAP
ALBUMIN: 5.1 g/dL (ref 3.5–5.5)
ALK PHOS: 74 IU/L (ref 39–117)
ALT: 23 IU/L (ref 0–44)
ANION GAP: 16 mmol/L (ref 10.0–18.0)
AST: 19 IU/L (ref 0–40)
Albumin/Globulin Ratio: 2.1 (ref 1.2–2.2)
BUN / CREAT RATIO: 8 — AB (ref 9–20)
BUN: 10 mg/dL (ref 6–24)
Bilirubin Total: 0.4 mg/dL (ref 0.0–1.2)
CALCIUM: 9.7 mg/dL (ref 8.7–10.2)
CO2: 24 mmol/L (ref 20–29)
CREATININE: 1.22 mg/dL (ref 0.76–1.27)
Chloride: 102 mmol/L (ref 96–106)
GFR, EST AFRICAN AMERICAN: 79 mL/min/{1.73_m2} (ref >59)
GFR, EST NON AFRICAN AMERICAN: 68 mL/min/{1.73_m2} (ref >59)
Globulin, Total: 2.4 g/dL (ref 1.5–4.5)
Glucose: 80 mg/dL (ref 65–99)
Potassium: 4.2 mmol/L (ref 3.5–5.2)
Sodium: 142 mmol/L (ref 134–144)
TOTAL PROTEIN: 7.5 g/dL (ref 6.0–8.5)

## 2017-09-11 LAB — PANCREATIC ELASTASE, FECAL: Pancreatic Elastase, Fecal: 171 ug Elast./g — ABNORMAL LOW (ref >200)

## 2017-09-11 LAB — CORTISOL-PM, BLOOD: Cortisol - PM: 8.7 ug/dL (ref 2.3–11.9)

## 2017-09-11 LAB — C-REACTIVE PROTEIN: CRP: 1 mg/L (ref 0.0–4.9)

## 2017-09-11 LAB — IGG, IGA, IGM
IGA/IMMUNOGLOBULIN A, SERUM: 212 mg/dL (ref 90–386)
IGG (IMMUNOGLOBIN G), SERUM: 1180 mg/dL (ref 700–1600)
IgM (Immunoglobulin M), Srm: 51 mg/dL (ref 20–172)

## 2017-09-11 LAB — TISSUE TRANSGLUTAMINASE, IGA: Transglutaminase IgA: <2 U/mL (ref 0–3)

## 2017-09-11 LAB — CALPROTECTIN, FECAL: CALPROTECTIN, FECAL: 160 ug/g — AB (ref 0–120)

## 2017-09-11 LAB — TSH: TSH: 1.36 u[IU]/mL (ref 0.450–4.500)

## 2017-09-13 NOTE — Progress Notes (Signed)
Lab results were faxed to Dr. Arta Silence at [phone] on 09-12-17 by Joellyn Haff, PBT  Per Clinic Lab documentation.  Maryan Rued, PBT 09-13-17  [phone]

## 2017-10-07 ENCOUNTER — Other Ambulatory Visit: Payer: Self-pay | Admitting: Pharmacist

## 2017-10-07 DIAGNOSIS — J45909 Unspecified asthma, uncomplicated: Secondary | ICD-10-CM

## 2017-10-07 DIAGNOSIS — I10 Essential (primary) hypertension: Secondary | ICD-10-CM

## 2017-10-07 DIAGNOSIS — E785 Hyperlipidemia, unspecified: Secondary | ICD-10-CM

## 2017-10-07 DIAGNOSIS — E1122 Type 2 diabetes mellitus with diabetic chronic kidney disease: Secondary | ICD-10-CM

## 2017-10-07 DIAGNOSIS — N182 Chronic kidney disease, stage 2 (mild): Secondary | ICD-10-CM

## 2017-10-07 MED ORDER — ROSUVASTATIN CALCIUM 20 MG PO TABS: 20.0000 mg | ORAL_TABLET | Freq: Every day | ORAL | 3 refills | Status: DC

## 2017-10-07 MED ORDER — LISINOPRIL 10 MG PO TABS: 10.0000 mg | ORAL_TABLET | Freq: Every day | ORAL | 0 refills | Status: DC

## 2017-10-07 MED ORDER — MOMETASONE FURO-FORMOTEROL FUM 200-5 MCG/ACT IN AERO: 1.0000 | INHALATION_SPRAY | Freq: Two times a day (BID) | RESPIRATORY_TRACT | 11 refills | Status: DC

## 2017-10-07 MED ORDER — METFORMIN HCL ER 500 MG PO TB24: 500.0000 mg | ORAL_TABLET | Freq: Every day | ORAL | 1 refills | Status: DC

## 2017-11-07 ENCOUNTER — Ambulatory Visit: Payer: No Typology Code available for payment source | Admitting: Pharmacist

## 2017-12-17 ENCOUNTER — Telehealth: Payer: Self-pay | Admitting: Internal Medicine

## 2017-12-30 ENCOUNTER — Ambulatory Visit: Payer: No Typology Code available for payment source

## 2018-01-21 ENCOUNTER — Other Ambulatory Visit: Payer: Self-pay | Admitting: Internal Medicine

## 2018-01-21 DIAGNOSIS — K209 Esophagitis, unspecified without bleeding: Secondary | ICD-10-CM

## 2018-01-27 ENCOUNTER — Ambulatory Visit: Payer: No Typology Code available for payment source

## 2018-01-28 ENCOUNTER — Ambulatory Visit: Payer: No Typology Code available for payment source

## 2018-02-12 ENCOUNTER — Ambulatory Visit: Payer: No Typology Code available for payment source

## 2018-02-13 ENCOUNTER — Ambulatory Visit (INDEPENDENT_AMBULATORY_CARE_PROVIDER_SITE_OTHER): Payer: No Typology Code available for payment source | Admitting: Internal Medicine

## 2018-02-13 ENCOUNTER — Other Ambulatory Visit: Payer: Self-pay

## 2018-02-13 ENCOUNTER — Encounter: Payer: Self-pay | Admitting: Internal Medicine

## 2018-02-13 VITALS — BP 144/89 | HR 58 | Temp 97.8°F | Ht 73.0 in | Wt 198.2 lb

## 2018-02-13 DIAGNOSIS — G8929 Other chronic pain: Secondary | ICD-10-CM

## 2018-02-13 DIAGNOSIS — J45909 Unspecified asthma, uncomplicated: Secondary | ICD-10-CM

## 2018-02-13 DIAGNOSIS — I10 Essential (primary) hypertension: Secondary | ICD-10-CM

## 2018-02-13 DIAGNOSIS — Z1211 Encounter for screening for malignant neoplasm of colon: Secondary | ICD-10-CM

## 2018-02-13 DIAGNOSIS — E785 Hyperlipidemia, unspecified: Secondary | ICD-10-CM

## 2018-02-13 DIAGNOSIS — E1122 Type 2 diabetes mellitus with diabetic chronic kidney disease: Secondary | ICD-10-CM

## 2018-02-13 DIAGNOSIS — M545 Low back pain: Secondary | ICD-10-CM

## 2018-02-13 DIAGNOSIS — N182 Chronic kidney disease, stage 2 (mild): Secondary | ICD-10-CM

## 2018-02-13 DIAGNOSIS — Z0000 Encounter for general adult medical examination without abnormal findings: Secondary | ICD-10-CM

## 2018-02-13 LAB — POCT GLYCOSYLATED HEMOGLOBIN (HGB A1C): Hemoglobin A1C: 6.9 % — AB (ref 4.0–5.6)

## 2018-02-13 LAB — GLUCOSE, CAPILLARY: Glucose-Capillary: 101 mg/dL — ABNORMAL HIGH (ref 65–99)

## 2018-02-13 MED ORDER — ALBUTEROL SULFATE HFA 108 (90 BASE) MCG/ACT IN AERS: 2.0000 | INHALATION_SPRAY | Freq: Four times a day (QID) | RESPIRATORY_TRACT | 3 refills | Status: DC | PRN

## 2018-02-13 MED ORDER — LISINOPRIL 20 MG PO TABS: 20.0000 mg | ORAL_TABLET | Freq: Every day | ORAL | 1 refills | Status: DC

## 2018-02-13 NOTE — Assessment & Plan Note (Addendum)
Pneumonia vaccination Declined pneumonia vaccine today.  Colon cancer screening Interested in screening colonoscopy. Denies hematochezia or family history of colon cancer. - Referral to GI placed

## 2018-02-13 NOTE — Assessment & Plan Note (Signed)
Assessment Reports adherence with Dulera twice a day and using albuterol inhaler 3-4 times weekly. Has 1 nightly episode per month. Patient is hesitant to start new medications. Continue for now.  Plan - Continue Dulera - Refilled albuterol inhaler - Advised patient to try OTC claritin or zyrtec

## 2018-02-13 NOTE — Assessment & Plan Note (Signed)
Assessment BP today 151/83, repeat 144/89. Patient asymptomatic. Reports missing ~1-2 doses of lisinopril weekly.  Plan - Increase to lisinopril  daily - F/u in 3 months - Recheck BMP at next visit

## 2018-02-13 NOTE — Assessment & Plan Note (Signed)
Assessment A1c 6.9. Reports adherence to metformin. No hypoglycemic episodes  Plan - Continue metformin XR  daily

## 2018-02-13 NOTE — Assessment & Plan Note (Signed)
Stable - Continue rosuvastatin  QHS

## 2018-02-13 NOTE — Addendum Note (Signed)
Addended by: Neomia Dear on: 02/13/2018 06:10 PM   Modules accepted: Orders

## 2018-02-13 NOTE — Assessment & Plan Note (Signed)
Assessment Right > left lower back cramping pain. Tender to palpation along paraspinal muscles. No dysuria or hematuria. Cramps are intermittent and are worse with sitting or standing for long periods of time. Stretching and lying down help his symptoms. He reports some relief with heating pads and advil. He has tried flexeril before with relief, but does not want to take extra medications if he does not need them. MRI in 2017 showed mild-moderate bilateral foramen narrowing at C5-6 and C6-7, as well as mild central stenosis in spinal canal at C5-6. EMG/NCV in 05/2013 did not show etiology for his pain. No red flag symptoms. Most likely MSK pain. Will continue conservative therapy.  Plan - Continue NSAIDs PRN and heating pad PRN

## 2018-02-13 NOTE — Progress Notes (Signed)
   CC: management of HTN  HPI:  Javier Beasley is a 52 y.o. male with PMH of asthma, HTN, and diabetes who presents for management of HTN.  Please see the assessment and plan below for the status of the patient's chronic medical problems.  Past Medical History:  Diagnosis Date  . Allergy   . Asthma   . Bilateral chronic knee pain   . Chronic back pain   . Chronic shoulder pain   . Depression   . Diabetes mellitus without complication (HCC)    diagnosed in 2015  . GERD (gastroesophageal reflux disease)   . History of herniated intervertebral disc   . Hx of cardiovascular stress test    a. Lexiscan Myoview 10/29/12:  EF 56%, no ischemia  . Hx of echocardiogram    a. Echo 10/24/12:  EF 60-65%, normal wall thickness, normal diastolic fxn  . Hyperlipidemia   . Hypertension   . Reflux   . Renal disorder    chronic kidney disease   Review of Systems:   GEN: Negative for fevers CV: Negative for chest pain  PULM: Negative for cough or SOB EXT: Negative for LE swelling BACK: No radiculopathy symptoms. Positive for back pain.  Physical Exam:  Vitals:   02/13/18 1402  BP: (!) 151/83  Pulse: (!) 58  Temp: 97.8 F (36.6 C)  TempSrc: Oral  SpO2: 100%  Weight: 198 lb 3.2 oz (89.9 kg)  Height:  (1.854 m)   GEN: Sitting comfortably in chair in NAD CV: NR & RR, no m/r/g PULM: CTAB, no wheezes or rales BACK: Mild TTP of right>left paraspinal muscles. No point tenderness on lumbar spine. Able to flex and extend without issue. Patient feels "stretch" when bends to the left. EXT: No LE edema  Assessment & Plan:   See Encounters Tab for problem based charting.  Patient discussed with Dr. Heide Spark

## 2018-02-13 NOTE — Patient Instructions (Addendum)
FOLLOW-UP INSTRUCTIONS When: 3 months For: blood pressure and diabetes management What to bring: medications   Javier Beasley,  It was good to see you again.  We are increasing your blood pressure medicine today. Please take lisinopril  once a day.  For your back pain, continue to use heating pads and over the counter medicines as needed. It is most likely musculoskeletal.  I sent a refill of your albuterol to your pharmacy.  I have sent a referral to the stomach doctor to get the colonoscopy done.  If you have any questions or concerns, call our clinic at [phone] or after hours call [phone] and ask for the internal medicine resident on call.

## 2018-02-14 NOTE — Progress Notes (Signed)
Internal Medicine Clinic Attending  Case discussed with Dr. Huang at the time of the visit.  We reviewed the resident's history and exam and pertinent patient test results.  I agree with the assessment, diagnosis, and plan of care documented in the resident's note. 

## 2018-03-09 ENCOUNTER — Encounter: Payer: Self-pay | Admitting: *Deleted

## 2018-04-21 ENCOUNTER — Ambulatory Visit: Payer: No Typology Code available for payment source

## 2018-04-21 ENCOUNTER — Other Ambulatory Visit: Payer: Self-pay | Admitting: Gastroenterology

## 2018-04-22 ENCOUNTER — Encounter: Payer: Self-pay | Admitting: Internal Medicine

## 2018-04-22 ENCOUNTER — Other Ambulatory Visit: Payer: Self-pay

## 2018-04-22 ENCOUNTER — Ambulatory Visit (INDEPENDENT_AMBULATORY_CARE_PROVIDER_SITE_OTHER): Payer: No Typology Code available for payment source | Admitting: Internal Medicine

## 2018-04-22 VITALS — BP 143/93 | HR 67 | Temp 98.6°F | Ht 73.0 in | Wt 200.8 lb

## 2018-04-22 DIAGNOSIS — G8929 Other chronic pain: Secondary | ICD-10-CM

## 2018-04-22 DIAGNOSIS — M545 Low back pain, unspecified: Secondary | ICD-10-CM

## 2018-04-22 DIAGNOSIS — M48061 Spinal stenosis, lumbar region without neurogenic claudication: Secondary | ICD-10-CM

## 2018-04-22 DIAGNOSIS — M5126 Other intervertebral disc displacement, lumbar region: Secondary | ICD-10-CM

## 2018-04-22 MED ORDER — DULOXETINE HCL 30 MG PO CPEP: 30.0000 mg | ORAL_CAPSULE | Freq: Every day | ORAL | 0 refills | Status: DC

## 2018-04-22 MED ORDER — DULOXETINE HCL 60 MG PO CPEP: 60.0000 mg | ORAL_CAPSULE | Freq: Every day | ORAL | 0 refills | Status: DC

## 2018-04-22 MED FILL — DULoxetine HCL 30 MG CPEP: 30 | 7 days supply | Qty: 7 | Fill #0

## 2018-04-22 NOTE — Patient Instructions (Addendum)
[name],   For your back pain, please start taking cymbalta.   Take 1 tablet of 30 mg for 7 days. Then start taking 1 tablet of 60 mg every day.   Please come back in 1 month for follow up to make sure the Cymbalta is working.   - Dr. Evelene CroonSantos

## 2018-04-22 NOTE — Progress Notes (Signed)
   CC: Chronic lower back pain follow up   HPI:  [name] is a 52 y.o. male with PMH listed below who presents to clinic for follow up of chronic lower back pain.   Chronic lower back pain: Patient presents for follow up of chronic lower back pain that he describes as throbbing and localizes to the right flank. The pain is intermittent, and is worse with ambulation and prolonged periods of standing. Improves with stretching. He occassionally takes Ibuprofen which eases off the pain and he also uses a heating pad at times that also seems to help. Has tried Flexeril in the past, which caused sedation and he is not interested on using this again. He states he remains able to perform his chores at home, but most recently has had to take multiple breaks during the day to rest. Denies recent illness, fever, chills, recent trauma, urinary symptoms and changes in bowel movements. He does endorse diffuse joint pain. On exam, he is able to ambulate without difficulty. No tenderness to palpation over bilateral paraspinal muscles or flanks. No neurological deficits noted. Lumbar spine MRI from 2013 showed mild central canal stenosis L3-L5 associated with disc bulging and facet hypertrophy. I suspect this is an exacerbation of chronic low back pain secondary to OA. Will start duloxetine as below for management of chronic low back pain given impairment in daily activities, hopefully this will also help with his diffuse joint pain. We discussed goals of therapy targeting improvement in daily function vs resolution of pain. Recommended maintaining active lifestyle and exercising.  - Start duloxetine 30 mg x 1 week, then increase to 60 mg QD  - Follow up in 1-2 months    Past Medical History:  Diagnosis Date  . Allergy   . Asthma   . Bilateral chronic knee pain   . Chronic back pain   . Chronic shoulder pain   . Depression   . Diabetes mellitus without complication (HCC)    diagnosed in 2015  . GERD  (gastroesophageal reflux disease)   . History of herniated intervertebral disc   . Hx of cardiovascular stress test    a. Lexiscan Myoview 10/29/12:  EF 56%, no ischemia  . Hx of echocardiogram    a. Echo 10/24/12:  EF 60-65%, normal wall thickness, normal diastolic fxn  . Hyperlipidemia   . Hypertension   . Reflux   . Renal disorder    chronic kidney disease   Review of Systems:   Review of Systems  Constitutional: Negative for chills, fever and weight loss.  Gastrointestinal: Negative for constipation and diarrhea.  Genitourinary: Negative for frequency and urgency.  Musculoskeletal: Positive for back pain and joint pain. Negative for falls.  Neurological: Negative for sensory change, focal weakness, weakness and headaches.    Physical Exam:  Vitals:   04/22/18 0901  BP: (!) 143/93  Pulse: 67  Temp: 98.6 F (37 C)  TempSrc: Oral  SpO2: 100%  Weight: 200 lb 12.8 oz (91.1 kg)  Height: 6\' 1"  (1.854 m)   General: young, well-developed, well-nourished male, in no acute distress  CV: RRR, nl S1/S2, no mrg  Pulm: CTAB, no increased WOB on room air  Neuro: Alert and oriented x3, strength and sensation intact in all 4 extremities, no paraspinal muscle tenderness, ambulates without difficulty    Assessment & Plan:   See Encounters Tab for problem based charting.  Patient discussed with Dr. Oswaldo DoneVincent

## 2018-04-22 NOTE — Assessment & Plan Note (Signed)
Chronic lower back pain: Patient presents for follow up of chronic lower back pain that he describes as throbbing and localizes to the right flank. The pain is intermittent, and is worse with ambulation and prolonged periods of standing. Improves with stretching. He occassionally takes Ibuprofen which eases off the pain and he also uses a heating pad at times that also seems to help. Has tried Flexeril in the past, which caused sedation and he is not interested on using this again. He states he remains able to perform his chores at home, but most recently has had to take multiple breaks during the day to rest. Denies recent illness, fever, chills, recent trauma, urinary symptoms and changes in bowel movements. He does endorse diffuse joint pain. On exam, he is able to ambulate without difficulty. No tenderness to palpation over bilateral paraspinal muscles or flanks. No neurological deficits noted. Lumbar spine MRI from 2013 showed mild central canal stenosis L3-L5 associated with disc bulging and facet hypertrophy. I suspect this is an exacerbation of chronic low back pain secondary to OA. Will start duloxetine as below for management of chronic low back pain given impairment in daily activities, hopefully this will also help with his diffuse joint pain. We discussed goals of therapy targeting improvement in daily function vs resolution of pain. Recommended maintaining active lifestyle and exercising.  - Start duloxetine 30 mg x 1 week, then increase to 60 mg QD  - Follow up in 1-2 months

## 2018-04-23 NOTE — Addendum Note (Signed)
Addended by: Erlinda HongVINCENT, DUNCAN T on: 04/23/2018 09:44 AM   Modules accepted: Level of Service

## 2018-04-23 NOTE — Progress Notes (Signed)
Internal Medicine Clinic Attending  Case discussed with Dr. Santos-Sanchez at the time of the visit.  We reviewed the resident's history and exam and pertinent patient test results.  I agree with the assessment, diagnosis, and plan of care documented in the resident's note.    

## 2018-05-12 ENCOUNTER — Other Ambulatory Visit: Payer: Self-pay | Admitting: Gastroenterology

## 2018-05-13 MED FILL — PEG-3350 SOLUTION: 420 | 1 days supply | Qty: 4000 | Fill #0

## 2018-05-20 ENCOUNTER — Encounter (HOSPITAL_COMMUNITY): Payer: Self-pay | Admitting: *Deleted

## 2018-05-21 ENCOUNTER — Other Ambulatory Visit: Payer: Self-pay

## 2018-05-21 ENCOUNTER — Ambulatory Visit (HOSPITAL_COMMUNITY): Payer: Medicaid Other | Admitting: Anesthesiology

## 2018-05-21 ENCOUNTER — Encounter (HOSPITAL_COMMUNITY)
Admission: RE | Disposition: A | Discharge status: Home / Self Care | Payer: Self-pay | Source: Ambulatory Visit | Attending: Gastroenterology

## 2018-05-21 ENCOUNTER — Encounter (HOSPITAL_COMMUNITY): Payer: Self-pay | Admitting: Anesthesiology

## 2018-05-21 ENCOUNTER — Ambulatory Visit (HOSPITAL_COMMUNITY)
Admission: RE | Admit: 2018-05-21 | Discharge: 2018-05-21 | Disposition: A | Discharge status: Home / Self Care | Payer: Medicaid Other | Source: Ambulatory Visit | Attending: Gastroenterology | Admitting: Gastroenterology

## 2018-05-21 DIAGNOSIS — I10 Essential (primary) hypertension: Secondary | ICD-10-CM | POA: Diagnosis not present

## 2018-05-21 DIAGNOSIS — K644 Residual hemorrhoidal skin tags: Secondary | ICD-10-CM | POA: Diagnosis not present

## 2018-05-21 DIAGNOSIS — Z79899 Other long term (current) drug therapy: Secondary | ICD-10-CM | POA: Insufficient documentation

## 2018-05-21 DIAGNOSIS — R1030 Lower abdominal pain, unspecified: Secondary | ICD-10-CM | POA: Diagnosis not present

## 2018-05-21 DIAGNOSIS — Z7984 Long term (current) use of oral hypoglycemic drugs: Secondary | ICD-10-CM | POA: Diagnosis not present

## 2018-05-21 DIAGNOSIS — E119 Type 2 diabetes mellitus without complications: Secondary | ICD-10-CM | POA: Insufficient documentation

## 2018-05-21 DIAGNOSIS — K648 Other hemorrhoids: Secondary | ICD-10-CM | POA: Insufficient documentation

## 2018-05-21 DIAGNOSIS — K219 Gastro-esophageal reflux disease without esophagitis: Secondary | ICD-10-CM | POA: Diagnosis not present

## 2018-05-21 HISTORY — PX: COLONOSCOPY WITH PROPOFOL: SHX5780

## 2018-05-21 LAB — GLUCOSE, CAPILLARY: GLUCOSE-CAPILLARY: 100 mg/dL — AB (ref 70–99)

## 2018-05-21 SURGERY — COLONOSCOPY WITH PROPOFOL
Anesthesia: Monitor Anesthesia Care

## 2018-05-21 MED ORDER — SODIUM CHLORIDE 0.9 % IV SOLN: INTRAVENOUS | Status: DC

## 2018-05-21 MED ORDER — PROPOFOL 10 MG/ML IV BOLUS
INTRAVENOUS | Status: AC
  Filled 2018-05-21: qty 40

## 2018-05-21 MED ORDER — PROPOFOL 500 MG/50ML IV EMUL
INTRAVENOUS | Status: DC | PRN
  Administered 2018-05-21: 100 ug/kg/min via INTRAVENOUS

## 2018-05-21 MED ORDER — PROPOFOL 10 MG/ML IV BOLUS
INTRAVENOUS | Status: DC | PRN
  Administered 2018-05-21 (×3): 20 mg via INTRAVENOUS
  Administered 2018-05-21: 50 mg via INTRAVENOUS
  Administered 2018-05-21 (×2): 20 mg via INTRAVENOUS

## 2018-05-21 MED ORDER — LACTATED RINGERS IV SOLN
INTRAVENOUS | Status: DC
  Administered 2018-05-21: 08:00:00 via INTRAVENOUS

## 2018-05-21 MED ORDER — PROPOFOL 10 MG/ML IV BOLUS
INTRAVENOUS | Status: AC
  Filled 2018-05-21: qty 20

## 2018-05-21 SURGICAL SUPPLY — 21 items

## 2018-05-21 NOTE — Transfer of Care (Signed)
Immediate Anesthesia Transfer of Care Note  Patient: Javier Beasley  Procedure(s) Performed: Procedure(s): COLONOSCOPY WITH PROPOFOL (N/A)  Patient Location: PACU  Anesthesia Type:MAC  Level of Consciousness:  sedated, patient cooperative and responds to stimulation  Airway & Oxygen Therapy:Patient Spontanous Breathing and Patient connected to face mask oxgen  Post-op Assessment:  Report given to PACU RN and Post -op Vital signs reviewed and stable  Post vital signs:  Reviewed and stable  Last Vitals:  Vitals:   05/21/18 0800 05/21/18 0934  BP: (!) 174/95 118/74  Pulse: 64 65  Resp: 14 15  Temp: 36.8 C 36.7 C  SpO2: 276% 701%    Complications: No apparent anesthesia complications

## 2018-05-21 NOTE — Anesthesia Preprocedure Evaluation (Signed)
Anesthesia Evaluation  Patient identified by MRN, date of birth, ID band Patient awake    Reviewed: Allergy & Precautions, NPO status , Patient's Chart, lab work & pertinent test results  Airway Mallampati: II  TM Distance: >3 FB Neck ROM: Full    Dental no notable dental hx.    Pulmonary neg pulmonary ROS,    Pulmonary exam normal breath sounds clear to auscultation       Cardiovascular hypertension, Normal cardiovascular exam Rhythm:Regular Rate:Normal     Neuro/Psych negative neurological ROS  negative psych ROS   GI/Hepatic Neg liver ROS, GERD  ,  Endo/Other  diabetes  Renal/GU negative Renal ROS  negative genitourinary   Musculoskeletal negative musculoskeletal ROS (+)   Abdominal   Peds negative pediatric ROS (+)  Hematology negative hematology ROS (+)   Anesthesia Other Findings   Reproductive/Obstetrics negative OB ROS                             Anesthesia Physical Anesthesia Plan  ASA: II  Anesthesia Plan: MAC   Post-op Pain Management:    Induction: Intravenous  PONV Risk Score and Plan:   Airway Management Planned: Simple Face Mask  Additional Equipment:   Intra-op Plan:   Post-operative Plan:   Informed Consent: I have reviewed the patients History and Physical, chart, labs and discussed the procedure including the risks, benefits and alternatives for the proposed anesthesia with the patient or authorized representative who has indicated his/her understanding and acceptance.   Dental advisory given  Plan Discussed with: CRNA and Surgeon  Anesthesia Plan Comments:         Anesthesia Quick Evaluation

## 2018-05-21 NOTE — H&P (Signed)
Patient interval history reviewed.  Patient examined again.  There has been no change from documented H/P dated 04/21/18 (scanned into chart from our office) except as documented above.  Assessment:  1.  Abdominal pain. 2.  No prior screening colonoscopy.  Plan:  1.  Colonoscopy. 2.  Risks (bleeding, infection, bowel perforation that could require surgery, sedation-related changes in cardiopulmonary systems), benefits (identification and possible treatment of source of symptoms, exclusion of certain causes of symptoms), and alternatives (watchful waiting, radiographic imaging studies, empiric medical treatment) of colonoscopy were explained to patient/family in detail and patient wishes to proceed.

## 2018-05-21 NOTE — Anesthesia Postprocedure Evaluation (Signed)
Anesthesia Post Note  Patient: Javier Beasley  Procedure(s) Performed: COLONOSCOPY WITH PROPOFOL (N/A )     Patient location during evaluation: PACU Anesthesia Type: MAC Level of consciousness: awake and alert Pain management: pain level controlled Vital Signs Assessment: post-procedure vital signs reviewed and stable Respiratory status: spontaneous breathing, nonlabored ventilation, respiratory function stable and patient connected to nasal cannula oxygen Cardiovascular status: stable and blood pressure returned to baseline Postop Assessment: no apparent nausea or vomiting Anesthetic complications: no    Last Vitals:  Vitals:   05/21/18 0944 05/21/18 0955  BP: 123/72 134/82  Pulse: (!) 53 (!) 58  Resp: 12 13  Temp:    SpO2: 100% 100%    Last Pain:  Vitals:   05/21/18 0955  TempSrc:   PainSc: 0-No pain                 Shalva Rozycki S

## 2018-05-21 NOTE — Discharge Instructions (Signed)
Colonoscopy ° °Post procedure instructions: ° °Read the instructions outlined below and refer to this sheet in the next few weeks. These discharge instructions provide you with general information on caring for yourself after you leave the hospital. Your doctor may also give you specific instructions. While your treatment has been planned according to the most current medical practices available, unavoidable complications occasionally occur. If you have any problems or questions after discharge, call Dr. Raveena Hebdon at [facility] ([phone]). ° °HOME CARE INSTRUCTIONS ° °ACTIVITY: °· You may resume your regular activity, but move at a slower pace for the next 24 hours.  °· Take frequent rest periods for the next 24 hours.  °· Walking will help get rid of the air and reduce the bloated feeling in your belly (abdomen).  °· No driving for 24 hours (because of the medicine (anesthesia) used during the test).  °· You may shower.  °· Do not sign any important legal documents or operate any machinery for 24 hours (because of the anesthesia used during the test).  °NUTRITION: °· Drink plenty of fluids.  °· You may resume your normal diet as instructed by your doctor.  °· Begin with a light meal and progress to your normal diet. Heavy or fried foods are harder to digest and may make you feel sick to your stomach (nauseated).  °· Avoid alcoholic beverages for 24 hours or as instructed.  °MEDICATIONS: °· You may resume your normal medications unless your doctor tells you otherwise.  °WHAT TO EXPECT TODAY: °· Some feelings of bloating in the abdomen.  °· Passage of more gas than usual.  °· Spotting of blood in your stool or on the toilet paper.  °IF YOU HAD POLYPS REMOVED DURING THE COLONOSCOPY: °· No aspirin products for 7 days or as instructed.  °· No alcohol for 7 days or as instructed.  °· Eat a soft diet for the next 24 hours.  ° °FINDING OUT THE RESULTS OF YOUR TEST ° °Not all test results are available during your  visit. If your test results are not back during the visit, make an appointment with your caregiver to find out the results. Do not assume everything is normal if you have not heard from your caregiver or the medical facility. It is important for you to follow up on all of your test results.  ° ° ° °SEEK IMMEDIATE MEDICAL CARE IF: ° °· You have more than a spotting of blood in your stool.  °· Your belly is swollen (abdominal distention).  °· You are nauseated or vomiting.  °· You have a fever.  °· You have abdominal pain or discomfort that is severe or gets worse throughout the day.  ° ° °Document Released: 04/17/2004 Document Revised: 05/16/2011 Document Reviewed: 04/15/2008 °[employer]® Patient Information ©2012 [employer]. ° °

## 2018-05-21 NOTE — Op Note (Signed)
[facility] Patient Name: Javier Beasley Procedure Date: 05/21/2018 MRN: [id number] Attending MD: Willis Modena , MD Date of Birth: [birth year] CSN: [id number] Age: 52 Admit Type: Outpatient Procedure:                Colonoscopy Indications:              This is the patient's first colonoscopy, Lower                            abdominal pain Providers:                Willis Modena, MD, Janae Sauce. Steele Berg, RN, Verita Schneiders, Technician, Paris Lore CRNA, CRNA Referring MD:              Medicines:                Monitored Anesthesia Care Complications:            No immediate complications. Estimated Blood Loss:     Estimated blood loss: none. Procedure:                Pre-Anesthesia Assessment:                           - Prior to the procedure, a History and Physical                            was performed, and patient medications and                            allergies were reviewed. The patient's tolerance of                            previous anesthesia was also reviewed. The risks                            and benefits of the procedure and the sedation                            options and risks were discussed with the patient.                            All questions were answered, and informed consent                            was obtained. Prior Anticoagulants: The patient has                            taken no previous anticoagulant or antiplatelet                            agents. ASA Grade Assessment: II - A patient with  mild systemic disease. After reviewing the risks                            and benefits, the patient was deemed in                            satisfactory condition to undergo the procedure.                           After obtaining informed consent, the colonoscope                            was passed under direct vision. Throughout the                            procedure,  the patient's blood pressure, pulse, and                            oxygen saturations were monitored continuously. The                            CF-HQ190L ([id number]) Olympus adult colonoscope was                            introduced through the anus and advanced to the the                            cecum, identified by appendiceal orifice and                            ileocecal valve. The ileocecal valve, appendiceal                            orifice, and rectum were photographed. The entire                            colon was examined. The colonoscopy was performed                            without difficulty. The patient tolerated the                            procedure well. The quality of the bowel                            preparation was good. Scope In: 9:03:53 AM Scope Out: 9:28:51 AM Scope Withdrawal Time: 0 hours 13 minutes 16 seconds  Total Procedure Duration: 0 hours 24 minutes 58 seconds  Findings:      Hemorrhoids were found on perianal exam.      External internal hemorrhoids were found during retroflexion. The       hemorrhoids were large.      Colon otherwise normal; no other polyps, masses, vascular ectasias, or       inflammatory changes were seen.  No additional abnormalities were found on retroflexion. Impression:               - Hemorrhoids found on perianal exam.                           - External internal hemorrhoids.                           - Otherwise normal colonoscopy; no source of                            abdominal pain seen; do not think abdominal pain is                            GI tract in origin. Moderate Sedation:      N/A- Per Anesthesia Care Recommendation:           - Patient has a contact number available for                            emergencies. The signs and symptoms of potential                            delayed complications were discussed with the                            patient. Return to normal activities tomorrow.                             Written discharge instructions were provided to the                            patient.                           - Discharge patient to home (ambulatory).                           - High fiber diet indefinitely.                           - Topical therapies (e.g., Preparation-H),                            avoidance of constipation/straining, liberal water                            intake, for management of hemorrhoids.                           - Continue present medications.                           - Repeat colonoscopy in 10 years for screening  purposes.                           - Return to GI clinic PRN.                           - Return to referring physician as previously                            scheduled. Procedure Code(s):        --- Professional ---                           [phone], Colonoscopy, flexible; diagnostic, including                            collection of specimen(s) by brushing or washing,                            when performed (separate procedure) Diagnosis Code(s):        --- Professional ---                           K64.4, Residual hemorrhoidal skin tags                           K64.8, Other hemorrhoids                           R10.30, Lower abdominal pain, unspecified CPT copyright 2017 American Medical Association. All rights reserved. The codes documented in this report are preliminary and upon coder review may  be revised to meet current compliance requirements. Willis Modena, MD 05/21/2018 9:37:14 AM This report has been signed electronically. Number of Addenda: 0

## 2018-05-22 ENCOUNTER — Encounter (HOSPITAL_COMMUNITY): Payer: Self-pay | Admitting: Gastroenterology

## 2018-07-18 ENCOUNTER — Other Ambulatory Visit: Payer: Self-pay | Admitting: *Deleted

## 2018-07-18 DIAGNOSIS — E1122 Type 2 diabetes mellitus with diabetic chronic kidney disease: Secondary | ICD-10-CM

## 2018-07-18 DIAGNOSIS — N182 Chronic kidney disease, stage 2 (mild): Principal | ICD-10-CM

## 2018-07-18 MED ORDER — METFORMIN HCL ER 500 MG PO TB24: 500.0000 mg | ORAL_TABLET | Freq: Every day | ORAL | 1 refills | Status: DC

## 2018-07-24 ENCOUNTER — Other Ambulatory Visit: Payer: Self-pay | Admitting: *Deleted

## 2018-07-24 NOTE — Telephone Encounter (Signed)
Pt appt 09/18/2018 @ 2:15.

## 2018-09-18 ENCOUNTER — Other Ambulatory Visit: Payer: Self-pay

## 2018-09-18 ENCOUNTER — Encounter: Payer: Self-pay | Admitting: Internal Medicine

## 2018-09-18 ENCOUNTER — Ambulatory Visit (INDEPENDENT_AMBULATORY_CARE_PROVIDER_SITE_OTHER): Payer: Medicare HMO | Admitting: Internal Medicine

## 2018-09-18 DIAGNOSIS — I10 Essential (primary) hypertension: Secondary | ICD-10-CM

## 2018-09-18 DIAGNOSIS — E11649 Type 2 diabetes mellitus with hypoglycemia without coma: Secondary | ICD-10-CM | POA: Diagnosis not present

## 2018-09-18 DIAGNOSIS — E1122 Type 2 diabetes mellitus with diabetic chronic kidney disease: Secondary | ICD-10-CM | POA: Diagnosis not present

## 2018-09-18 DIAGNOSIS — G8929 Other chronic pain: Secondary | ICD-10-CM | POA: Diagnosis not present

## 2018-09-18 DIAGNOSIS — Z79899 Other long term (current) drug therapy: Secondary | ICD-10-CM

## 2018-09-18 DIAGNOSIS — N182 Chronic kidney disease, stage 2 (mild): Secondary | ICD-10-CM

## 2018-09-18 DIAGNOSIS — I129 Hypertensive chronic kidney disease with stage 1 through stage 4 chronic kidney disease, or unspecified chronic kidney disease: Secondary | ICD-10-CM | POA: Diagnosis not present

## 2018-09-18 DIAGNOSIS — M545 Low back pain: Secondary | ICD-10-CM | POA: Diagnosis not present

## 2018-09-18 DIAGNOSIS — E785 Hyperlipidemia, unspecified: Secondary | ICD-10-CM

## 2018-09-18 DIAGNOSIS — R12 Heartburn: Secondary | ICD-10-CM | POA: Diagnosis not present

## 2018-09-18 DIAGNOSIS — Z0000 Encounter for general adult medical examination without abnormal findings: Secondary | ICD-10-CM

## 2018-09-18 DIAGNOSIS — Z7984 Long term (current) use of oral hypoglycemic drugs: Secondary | ICD-10-CM

## 2018-09-18 DIAGNOSIS — Z2821 Immunization not carried out because of patient refusal: Secondary | ICD-10-CM | POA: Diagnosis not present

## 2018-09-18 LAB — HM DIABETES EYE EXAM

## 2018-09-18 LAB — GLUCOSE, CAPILLARY
GLUCOSE-CAPILLARY: 121 mg/dL — AB (ref 70–99)
Glucose-Capillary: 67 mg/dL — ABNORMAL LOW (ref 70–99)

## 2018-09-18 LAB — POCT GLYCOSYLATED HEMOGLOBIN (HGB A1C): Hemoglobin A1C: 6.6 % — AB (ref 4.0–5.6)

## 2018-09-18 MED ORDER — ATORVASTATIN CALCIUM 80 MG PO TABS: 80.0000 mg | ORAL_TABLET | Freq: Every day | ORAL | 1 refills | Status: DC

## 2018-09-18 MED ORDER — ROSUVASTATIN CALCIUM 20 MG PO TABS: 20.0000 mg | ORAL_TABLET | Freq: Every day | ORAL | 0 refills | Status: DC

## 2018-09-18 NOTE — Assessment & Plan Note (Signed)
Declines flu vaccine and pneumococcal vaccine today. Colonoscopy 05/21/2018: positive for hemorrhoids otherwise normal. Repeat colonoscopy in 10 years. Lipid profile ordered. Will perform ophthalmology exam today.

## 2018-09-18 NOTE — Assessment & Plan Note (Signed)
BP well controled at 129/73 today.  -BMP today -Continue Lisinopril 20 mg QD -F/u in clinic in 6 months or earlier as needed

## 2018-09-18 NOTE — Assessment & Plan Note (Addendum)
A1c today: 6.6. BG today 67, gave cracker. Patient did not have lunch today. Asymptomatic. Repeated BG 121 after giving cracker.  Reports compliance to Metformin XR 500 mg QD.  -Continue Metformin 500 mh QD  -Recommended not to skip meals and come to clinic or urgent care if feeling dizzy to check BG

## 2018-09-18 NOTE — Assessment & Plan Note (Addendum)
Refilled Crestor 20 mg QD.   -Lipid profile  Addendum--> Lipid profile: Total Chol: 224, LDL:122, improved since last year but still not at the goal. With ASCVD risk 17.9%.  -Increasing Crestor to 40 mg QD  Lipid Panel     Component Value Date/Time   CHOL 224 (H) 09/18/2018 1420   TRIG 239 (H) 09/18/2018 1420   HDL 54 09/18/2018 1420   CHOLHDL 4.1 09/18/2018 1420   CHOLHDL 3.6 01/20/2015 1104   VLDL 19 01/20/2015 1104   LDLCALC 122 (H) 09/18/2018 1420

## 2018-09-18 NOTE — Assessment & Plan Note (Signed)
Patient reports some worsening of low back pain particularly when standing for a long time.  Denies any numbness or weakness.  No fever or chills.  On exam, there is no tenderness on the spine or CVA angle tenderness. Sensory and motor are intact and SLR is negative.  Patient has not taken duloxetine due to side effects.  (He reports palpitation with it.)  Also avoids Flexeril due to sedation. He thinks only thing that helps him significantly is getting massage.   -Recommend him to avoid lifting heavy stuff -Can continue getting massage therapy

## 2018-09-18 NOTE — Patient Instructions (Addendum)
Thank you for allowing [place] to provide your care today. Today we discussed your blood glucose level that was low at 67. Repeating blood sugar came back to normal at 121. Please make sure not to skip meal again. If developed more dizziness, come to clinic or urgent care to have your blood sugar checked.   Your blood pressure today was 129/73 which is good. Please keep up good work and take your medication as before. I refilled Crestor for you. Please pick up and take it regularly. We also talked about your back pain, your exam does not show acute concern, as we discussed, avoid lifting up heavy stuff and you can continue getting massage.   We performed the eye exam for you today, and offered you flu and pneumococcal vaccine that you declined. I understand that you are not interested in that but let us know if changing your mind. I also ordered labs for you. I will call if any are abnormal.   Please follow-up in 6 months or earlier as needed. Should you have any questions or concerns please call the internal medicine clinic at [phone].    Thank you

## 2018-09-18 NOTE — Progress Notes (Signed)
CC: Hypertension F/u  HPI:  [name] is a 53 y.o. with PMHx as documented below, is here in clinic for follow up. Also complains of worsening of back pain.  Please see problem based charting for further details and assessment and plan.  Past Medical History:  Diagnosis Date  . Allergy   . Asthma   . Bilateral chronic knee pain   . Chronic back pain   . Chronic shoulder pain   . Depression   . Diabetes mellitus without complication (HCC)    diagnosed in 2015  . GERD (gastroesophageal reflux disease)   . History of herniated intervertebral disc   . Hx of cardiovascular stress test    a. Lexiscan Myoview 10/29/12:  EF 56%, no ischemia  . Hx of echocardiogram    a. Echo 10/24/12:  EF 60-65%, normal wall thickness, normal diastolic fxn  . Hyperlipidemia   . Hypertension   . Reflux   . Renal disorder    chronic kidney disease   Social Hx: Non smoker Rarely drinks alcohol No ilicit Drug use  Review of Systems: Review of Systems  Constitutional: Negative for chills, fever and weight loss.  Respiratory: Negative for cough and shortness of breath.   Cardiovascular: Negative for chest pain, palpitations and leg swelling.  Gastrointestinal: Positive for heartburn. Negative for abdominal pain, constipation, diarrhea, nausea and vomiting.  Genitourinary: Positive for flank pain. Negative for dysuria and hematuria.  Neurological: Positive for dizziness. Negative for headaches.  Musculoskeletal: Low back pain  Family history: DM           Mother HTN         Mother Cancer    Mother  Social history: Does not smoke No alcohol use No illicit drug use  Physical Exam:  Vitals:   09/18/18 1427  BP: 129/73  Pulse: 74  Temp: 98.2 F (36.8 C)  TempSrc: Oral  SpO2: 99%  Weight: 199 lb 4.8 oz (90.4 kg)  Height: 6\' 1"  (1.854 m)   Physical Exam Vitals signs and nursing note reviewed.  Constitutional:      Appearance: Normal appearance.  HENT:     Head: Normocephalic  and atraumatic.     Mouth/Throat:     Mouth: Mucous membranes are moist.  Eyes:     Extraocular Movements: Extraocular movements intact.     Pupils: Pupils are equal, round, and reactive to light.  Cardiovascular:     Rate and Rhythm: Normal rate and regular rhythm.     Pulses: Normal pulses.     Heart sounds: Normal heart sounds. No murmur.  Pulmonary:     Effort: Pulmonary effort is normal.     Breath sounds: Normal breath sounds. No wheezing or rales.  Abdominal:     General: Bowel sounds are normal.     Palpations: Abdomen is soft.  Musculoskeletal:        General: No tenderness.     Left lower leg: No edema.  Neurological:     General: No focal deficit present.     Mental Status: He is alert and oriented to person, place, and time.     Sensory: No sensory deficit.     Motor: No weakness.     SLR: Negative  Psychiatric:        Mood and Affect: Mood normal.        Behavior: Behavior normal.    Assessment & Plan:   See Encounters Tab for problem based charting.  Patient discussed with  Dr. Rebeca Alert

## 2018-09-19 LAB — LIPID PANEL
CHOL/HDL RATIO: 4.1 ratio (ref 0.0–5.0)
Cholesterol, Total: 224 mg/dL — ABNORMAL HIGH (ref 100–199)
HDL: 54 mg/dL (ref >39)
LDL CALC: 122 mg/dL — AB (ref 0–99)
Triglycerides: 239 mg/dL — ABNORMAL HIGH (ref 0–149)
VLDL Cholesterol Cal: 48 mg/dL — ABNORMAL HIGH (ref 5–40)

## 2018-09-19 LAB — BMP8+ANION GAP
Anion Gap: 17 mmol/L (ref 10.0–18.0)
BUN/Creatinine Ratio: 12 (ref 9–20)
BUN: 15 mg/dL (ref 6–24)
CO2: 23 mmol/L (ref 20–29)
Calcium: 9.7 mg/dL (ref 8.7–10.2)
Chloride: 100 mmol/L (ref 96–106)
Creatinine, Ser: 1.3 mg/dL — ABNORMAL HIGH (ref 0.76–1.27)
GFR calc Af Amer: 73 mL/min/{1.73_m2} (ref >59)
GFR calc non Af Amer: 63 mL/min/{1.73_m2} (ref >59)
Glucose: 62 mg/dL — ABNORMAL LOW (ref 65–99)
Potassium: 4.3 mmol/L (ref 3.5–5.2)
Sodium: 140 mmol/L (ref 134–144)

## 2018-09-19 MED ORDER — ROSUVASTATIN CALCIUM 40 MG PO TABS: 20.0000 mg | ORAL_TABLET | Freq: Every day | ORAL | 2 refills | Status: DC

## 2018-09-19 NOTE — Addendum Note (Signed)
Addended by: Anne Shutter on: 09/19/2018 12:07 PM   Modules accepted: Level of Service

## 2018-09-19 NOTE — Addendum Note (Signed)
Addended byChevis Pretty on: 09/19/2018 01:27 PM   Modules accepted: Orders

## 2018-09-19 NOTE — Progress Notes (Signed)
Internal Medicine Clinic Attending  Case discussed with Dr. Maryla Morrow at the time of the visit.  We reviewed the resident's history and exam and pertinent patient test results.  I agree with the assessment, diagnosis, and plan of care documented in the resident's note.  Hypoglycemia this afternoon, has not eaten all day. Only on metformin 500 daily, although A1C is 6.6%, hesitant to stop it based on one low value after skipping meals. Risk of hypoglycemia relatively low with metformin. Continue for now, but reconsider if ongoing issue.  Chronic back with no red flags, main benefit is from massage therapy, would recommend continuing.   Jessy Oto, M.D., Ph.D.

## 2018-09-22 NOTE — Progress Notes (Signed)
Ok, Thank you very much.

## 2018-09-24 ENCOUNTER — Encounter: Payer: Self-pay | Admitting: Dietician

## 2018-10-13 ENCOUNTER — Encounter: Payer: Self-pay | Admitting: Dietician

## 2018-10-17 ENCOUNTER — Encounter: Payer: Self-pay | Admitting: Internal Medicine

## 2018-10-20 MED FILL — ROSUVASTATIN CALCIUM 20 MG: 20 | 90 days supply | Qty: 90 | Fill #0

## 2018-10-20 NOTE — Telephone Encounter (Signed)
Dr Maryla Morrow, is pt on Ctrestor 20 mg or increased to 40 mg ? "Increasing Crestor to 40 mg QD"per 09/18/18 office note.  [facility] has rx for 20 mg. Thanks

## 2018-12-15 DIAGNOSIS — I10 Essential (primary) hypertension: Secondary | ICD-10-CM

## 2018-12-15 DIAGNOSIS — J45909 Unspecified asthma, uncomplicated: Secondary | ICD-10-CM

## 2018-12-16 MED ORDER — MOMETASONE FURO-FORMOTEROL FUM 200-5 MCG/ACT IN AERO: 1.0000 | INHALATION_SPRAY | Freq: Two times a day (BID) | RESPIRATORY_TRACT | 11 refills | Status: DC

## 2018-12-16 MED ORDER — LISINOPRIL 20 MG PO TABS: 20.0000 mg | ORAL_TABLET | Freq: Every day | ORAL | 1 refills | Status: DC

## 2018-12-16 MED ORDER — ALBUTEROL SULFATE HFA 108 (90 BASE) MCG/ACT IN AERS: 2.0000 | INHALATION_SPRAY | Freq: Four times a day (QID) | RESPIRATORY_TRACT | 3 refills | Status: DC | PRN

## 2018-12-16 MED FILL — ALBUTEROL SULFATE HFA 108 (: 108 (90 BAS | 25 days supply | Qty: 9 | Fill #0

## 2018-12-16 MED FILL — LISINOPRIL 20 MG TABLET: 20 | 90 days supply | Qty: 90 | Fill #0

## 2018-12-17 ENCOUNTER — Telehealth: Payer: Self-pay | Admitting: *Deleted

## 2018-12-17 MED FILL — DULERA 200 MCG/5 MCG INH: 200-5 | 60 days supply | Qty: 13 | Fill #0

## 2018-12-17 NOTE — Telephone Encounter (Signed)
Information was sent through CoverMyMeds for [place] for [facility] 200-5MCG /ACT Aerosol.  Approved 09/17/2018 through 09/17/2019.  Angelina Ok, RN 12/17/2018 1:56 PM.

## 2019-01-26 MED FILL — ALBUTEROL SULFATE HFA 108 (: 108 (90 BAS | 25 days supply | Qty: 9 | Fill #1

## 2019-02-05 MED FILL — DULERA 200 MCG/5 MCG INH: 200-5 | 60 days supply | Qty: 13 | Fill #1

## 2019-02-16 ENCOUNTER — Encounter: Payer: Self-pay | Admitting: Internal Medicine

## 2019-02-17 ENCOUNTER — Ambulatory Visit (INDEPENDENT_AMBULATORY_CARE_PROVIDER_SITE_OTHER): Payer: Medicare HMO | Admitting: Internal Medicine

## 2019-02-17 ENCOUNTER — Other Ambulatory Visit: Payer: Self-pay

## 2019-02-17 VITALS — BP 169/70 | HR 88 | Temp 98.0°F | Ht 73.0 in | Wt 196.5 lb

## 2019-02-17 DIAGNOSIS — E1122 Type 2 diabetes mellitus with diabetic chronic kidney disease: Secondary | ICD-10-CM | POA: Diagnosis not present

## 2019-02-17 DIAGNOSIS — E785 Hyperlipidemia, unspecified: Secondary | ICD-10-CM

## 2019-02-17 DIAGNOSIS — Z79899 Other long term (current) drug therapy: Secondary | ICD-10-CM | POA: Diagnosis not present

## 2019-02-17 DIAGNOSIS — I129 Hypertensive chronic kidney disease with stage 1 through stage 4 chronic kidney disease, or unspecified chronic kidney disease: Secondary | ICD-10-CM | POA: Diagnosis not present

## 2019-02-17 DIAGNOSIS — Z7984 Long term (current) use of oral hypoglycemic drugs: Secondary | ICD-10-CM

## 2019-02-17 DIAGNOSIS — I10 Essential (primary) hypertension: Secondary | ICD-10-CM

## 2019-02-17 DIAGNOSIS — N182 Chronic kidney disease, stage 2 (mild): Secondary | ICD-10-CM

## 2019-02-17 LAB — POCT GLYCOSYLATED HEMOGLOBIN (HGB A1C): Hemoglobin A1C: 6.8 % — AB (ref 4.0–5.6)

## 2019-02-17 LAB — GLUCOSE, CAPILLARY: Glucose-Capillary: 283 mg/dL — ABNORMAL HIGH (ref 70–99)

## 2019-02-17 MED ORDER — LISINOPRIL 40 MG PO TABS: 20.0000 mg | ORAL_TABLET | Freq: Every day | ORAL | 1 refills | Status: DC

## 2019-02-17 MED ORDER — METFORMIN HCL ER 500 MG PO TB24: 500.0000 mg | ORAL_TABLET | Freq: Every day | ORAL | 1 refills | Status: DC

## 2019-02-17 MED ORDER — ROSUVASTATIN CALCIUM 40 MG PO TABS: 20.0000 mg | ORAL_TABLET | Freq: Every day | ORAL | 2 refills | Status: DC

## 2019-02-17 NOTE — Assessment & Plan Note (Signed)
Patient requesting Metformin refill. A1c 6.8 (stable from 5 months ago). Metformin refilled. - Metformin 500mg  Daily

## 2019-02-17 NOTE — Assessment & Plan Note (Signed)
Patient requesting refill for rosuvastatin, which was provided. - Rosuvastatin 40mg  Daily

## 2019-02-17 NOTE — Patient Instructions (Addendum)
Thank you for allowing [place] to care for you  For your high blood pressure - BP today was elevated - We will increase your Lisinopril to 40mg  Daily - Please take medication daily  Refill provided for Metformin and Statin   Please follow up with your PCP in about 1 month for recheck

## 2019-02-17 NOTE — Progress Notes (Signed)
   CC: Hypertension  HPI:  [name] is a 53 y.o. M with PMHx listed below presenting for Hypertension. Please see the A&P for the status of the patient's chronic medical problems.  Past Medical History:  Diagnosis Date  . Allergy   . Asthma   . Bilateral chronic knee pain   . Chronic back pain   . Chronic shoulder pain   . Depression   . Diabetes mellitus without complication (HCC)    diagnosed in 2015  . GERD (gastroesophageal reflux disease)   . History of herniated intervertebral disc   . Hx of cardiovascular stress test    a. Lexiscan Myoview 10/29/12:  EF 56%, no ischemia  . Hx of echocardiogram    a. Echo 10/24/12:  EF 60-65%, normal wall thickness, normal diastolic fxn  . Hyperlipidemia   . Hypertension   . Reflux   . Renal disorder    chronic kidney disease   Review of Systems:  Performed and all others negative.  Physical Exam:  Vitals:   02/17/19 1418  BP: (!) 169/70  Pulse: 88  Temp: 98 F (36.7 C)  SpO2: 100%  Weight: 196 lb 8 oz (89.1 kg)  Height: 6\' 1"  (1.854 m)   Physical Exam Constitutional:      General: He is not in acute distress.    Appearance: Normal appearance.  Cardiovascular:     Rate and Rhythm: Normal rate and regular rhythm.     Pulses: Normal pulses.     Heart sounds: Normal heart sounds.  Pulmonary:     Effort: Pulmonary effort is normal. No respiratory distress.     Breath sounds: Normal breath sounds.  Abdominal:     General: Bowel sounds are normal. There is no distension.     Palpations: Abdomen is soft.     Tenderness: There is no abdominal tenderness.  Musculoskeletal:        General: No swelling or deformity.  Skin:    General: Skin is warm and dry.  Neurological:     General: No focal deficit present.     Mental Status: Mental status is at baseline.     Assessment & Plan:   See Encounters Tab for problem based charting.  Patient discussed with Dr. Oswaldo Done

## 2019-02-17 NOTE — Telephone Encounter (Signed)
Called pt - stated his BP is high; he does have a BP monitor. BP has been as high as 170/120 then he re-checked 152/100. Stated he is taking Lisinopril as prescribed. Also c/o blurry vision (yesterday) and slight h/a. [facility] appt given for today @ 1415 PM. Pt advise if symptoms worsen to got to UC/ED.

## 2019-02-17 NOTE — Assessment & Plan Note (Addendum)
BP today 169/70. Patient presented due to elevated BPs at home 150s-170s systolic despite taking medication. (BP has ranged from 130s-150s in the past). Reports 3 days of intermittent blurry vision with a slight headache. Vision seemed to improve after naps and after he took to of his BP meds during one of the past two days. He states he takes his medicine most days but does miss 1-2 days every now and then (appears to be weekly); last missed doses were on Thursday and Saturday. Today, patient's vision is not blurry but he does have a mild headache. Although he has been only intermittently adherent to his regimen his BP is high enough (especially given he has had medication for the past 2 days and he has symptoms of headache and intermittent vision changes) that he warrants medication adjustment. Will increase Lisinopril to 40mg  Daily - Increase Lisinopril to 40mg  Daily - Follow up with PCP in about 1 month for BP check, BMET and Medication titration if needed

## 2019-02-18 ENCOUNTER — Telehealth: Payer: Self-pay | Admitting: Internal Medicine

## 2019-02-18 MED ORDER — LISINOPRIL 40 MG PO TABS: 40.0000 mg | ORAL_TABLET | Freq: Every day | ORAL | 1 refills | Status: DC

## 2019-02-18 MED FILL — ROSUVASTATIN CALCIUM 40 MG: 40 | 90 days supply | Qty: 45 | Fill #0

## 2019-02-18 MED FILL — metFORMIN HCL ER 500 MG TB2: 500 | 90 days supply | Qty: 90 | Fill #0

## 2019-02-18 MED FILL — LISINOPRIL 40 MG TABLET: 40 | 90 days supply | Qty: 90 | Fill #0

## 2019-02-18 NOTE — Addendum Note (Signed)
Addended by: Beola Cord B on: 02/18/2019 01:19 PM   Modules accepted: Orders

## 2019-02-18 NOTE — Telephone Encounter (Signed)
Pt was seen in [facility] by Dr. Alinda Money yesterday. Per office visit note lisinopril was to be increased to 40mg  daily, but RX was sent for lisinopril 40mg  with instructions on Sig to take 1/2 tab (20 mg daily). Please clarify which is correct and resend RX if necessary. Thank you! SChaplin, RN,BSN

## 2019-02-18 NOTE — Addendum Note (Signed)
Addended by: Erlinda Hong T on: 02/18/2019 01:16 PM   Modules accepted: Level of Service

## 2019-02-18 NOTE — Progress Notes (Signed)
Internal Medicine Clinic Attending  Case discussed with Dr. Melvin  at the time of the visit.  We reviewed the resident's history and exam and pertinent patient test results.  I agree with the assessment, diagnosis, and plan of care documented in the resident's note.  

## 2019-02-18 NOTE — Telephone Encounter (Signed)
Patient is to take 40mg  Daily, RX has been updated. Thank you

## 2019-02-18 NOTE — Telephone Encounter (Signed)
[facility] outpatient pharmacy called, unable to speak to pharmacist.  This nurse left VM for pharmacist which states lisinopril RX has been updated/corrected and resent per MD.  Left verbal new RX was for lisinopril 40mg , 1 tablet daily. SChaplin, RN,BSN

## 2019-02-18 NOTE — Telephone Encounter (Signed)
Please call the [facility] back about the Strength on the patient's lisinopril (ZESTRIL) 40 MG tablet.

## 2019-03-09 ENCOUNTER — Encounter: Payer: Self-pay | Admitting: Internal Medicine

## 2019-03-12 ENCOUNTER — Other Ambulatory Visit: Payer: Self-pay | Admitting: Internal Medicine

## 2019-03-12 DIAGNOSIS — G8929 Other chronic pain: Secondary | ICD-10-CM

## 2019-03-12 MED ORDER — CYCLOBENZAPRINE HCL 5 MG PO TABS: 5.0000 mg | ORAL_TABLET | Freq: Three times a day (TID) | ORAL | 0 refills | Status: DC | PRN

## 2019-03-13 MED FILL — CYCLOBENZAPRINE 5 MG TABLET: 5 | 7 days supply | Qty: 20 | Fill #0

## 2019-04-06 ENCOUNTER — Other Ambulatory Visit: Payer: Self-pay

## 2019-04-06 ENCOUNTER — Ambulatory Visit (INDEPENDENT_AMBULATORY_CARE_PROVIDER_SITE_OTHER): Payer: Medicare HMO | Admitting: Internal Medicine

## 2019-04-06 ENCOUNTER — Encounter: Payer: Self-pay | Admitting: Internal Medicine

## 2019-04-06 DIAGNOSIS — M25569 Pain in unspecified knee: Secondary | ICD-10-CM | POA: Diagnosis not present

## 2019-04-06 DIAGNOSIS — G8911 Acute pain due to trauma: Secondary | ICD-10-CM

## 2019-04-06 DIAGNOSIS — I10 Essential (primary) hypertension: Secondary | ICD-10-CM

## 2019-04-06 DIAGNOSIS — J45909 Unspecified asthma, uncomplicated: Secondary | ICD-10-CM

## 2019-04-06 DIAGNOSIS — H5369 Other night blindness: Secondary | ICD-10-CM | POA: Diagnosis not present

## 2019-04-06 DIAGNOSIS — Z7951 Long term (current) use of inhaled steroids: Secondary | ICD-10-CM

## 2019-04-06 DIAGNOSIS — R6889 Other general symptoms and signs: Secondary | ICD-10-CM | POA: Diagnosis not present

## 2019-04-06 DIAGNOSIS — Z79899 Other long term (current) drug therapy: Secondary | ICD-10-CM | POA: Diagnosis not present

## 2019-04-06 DIAGNOSIS — M549 Dorsalgia, unspecified: Secondary | ICD-10-CM | POA: Diagnosis not present

## 2019-04-06 DIAGNOSIS — M25512 Pain in left shoulder: Secondary | ICD-10-CM | POA: Diagnosis not present

## 2019-04-06 MED ORDER — ALBUTEROL SULFATE HFA 108 (90 BASE) MCG/ACT IN AERS: 2.0000 | INHALATION_SPRAY | Freq: Four times a day (QID) | RESPIRATORY_TRACT | 3 refills | Status: DC | PRN

## 2019-04-06 MED FILL — ALBUTEROL SULFATE HFA 108 (: 108 (90 BAS | 25 days supply | Qty: 9 | Fill #0

## 2019-04-06 NOTE — Assessment & Plan Note (Signed)
Patient asks for referral to ophthalmologist to be assessed for decreased night vision.   -Ambulatory referral to ophthalmology

## 2019-04-06 NOTE — Assessment & Plan Note (Signed)
BP well controled at 125/85. PR 91. -Continue Lisinopril 40 mg QD

## 2019-04-06 NOTE — Assessment & Plan Note (Addendum)
Occurred 2 nights ago (04/04/2019).  He did not have any ED visit for that. Complaining of generalized pain mostly at left shoulder, upper and lower back and right tight, and knee.   No neurologic symptoms.  No abdominal trauma.   No evidence of probable Fx on exam. No spine tenderness. No imaging at this point and manage for MSK pain.  -Recommedning heat pad, Voltaren gel vs Ibuprophene 400-800 mg BID PRN for 3 days and Flexeril PRN at night x 3 days. May prescribe short time Percocet if no improvement in 5 days. (patient instructed to give [place] a callin 5 days if no improvement) -F/u in clinic in 2 weeks as needed

## 2019-04-06 NOTE — Assessment & Plan Note (Signed)
Stable.  -Sending refill for Albuterol -Continue Albuterol and Dulera
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
## 2019-04-07 NOTE — Progress Notes (Signed)
Internal Medicine Clinic Attending  Case discussed with Dr. Masoudi  at the time of the visit.  We reviewed the resident's history and exam and pertinent patient test results.  I agree with the assessment, diagnosis, and plan of care documented in the resident's note.  

## 2019-05-05 DIAGNOSIS — E119 Type 2 diabetes mellitus without complications: Secondary | ICD-10-CM | POA: Diagnosis not present

## 2019-05-05 DIAGNOSIS — H524 Presbyopia: Secondary | ICD-10-CM | POA: Diagnosis not present

## 2019-05-05 DIAGNOSIS — H5213 Myopia, bilateral: Secondary | ICD-10-CM | POA: Diagnosis not present

## 2019-05-05 DIAGNOSIS — Z7984 Long term (current) use of oral hypoglycemic drugs: Secondary | ICD-10-CM | POA: Diagnosis not present

## 2019-05-05 LAB — HM DIABETES EYE EXAM

## 2019-05-13 ENCOUNTER — Telehealth: Payer: Self-pay

## 2019-05-13 DIAGNOSIS — Z20828 Contact with and (suspected) exposure to other viral communicable diseases: Secondary | ICD-10-CM

## 2019-05-13 DIAGNOSIS — Z20822 Contact with and (suspected) exposure to covid-19: Secondary | ICD-10-CM

## 2019-05-13 NOTE — Telephone Encounter (Signed)
Pt states his sig other tested POSITIVE appr 2 1/2 weeks ago their grbaby also tested POSITIVE at that time. He was not tested nor has he had any symptoms. They all went on 14 day quarantine and that ended Monday. In order to return to work he must have a NEGATIVE test. He is advised to go to [facility]. He is agreeable Do you agree?

## 2019-05-13 NOTE — Telephone Encounter (Signed)
Pt would like to be tested for COVID-19. Please call pt back.  

## 2019-05-13 NOTE — Telephone Encounter (Signed)
Agree. Do I need to place an order?

## 2019-05-14 ENCOUNTER — Other Ambulatory Visit: Payer: Self-pay

## 2019-05-14 DIAGNOSIS — R6889 Other general symptoms and signs: Secondary | ICD-10-CM | POA: Diagnosis not present

## 2019-05-14 DIAGNOSIS — Z20822 Contact with and (suspected) exposure to covid-19: Secondary | ICD-10-CM

## 2019-05-16 LAB — NOVEL CORONAVIRUS, NAA: SARS-CoV-2, NAA: NOT DETECTED

## 2019-05-21 ENCOUNTER — Encounter: Payer: Self-pay | Admitting: Internal Medicine

## 2019-05-26 DIAGNOSIS — H5213 Myopia, bilateral: Secondary | ICD-10-CM | POA: Diagnosis not present

## 2019-06-17 ENCOUNTER — Ambulatory Visit: Payer: Medicare HMO

## 2019-06-18 ENCOUNTER — Ambulatory Visit (INDEPENDENT_AMBULATORY_CARE_PROVIDER_SITE_OTHER): Payer: Medicare HMO | Admitting: Internal Medicine

## 2019-06-18 ENCOUNTER — Encounter: Payer: Self-pay | Admitting: Internal Medicine

## 2019-06-18 ENCOUNTER — Other Ambulatory Visit: Payer: Self-pay

## 2019-06-18 VITALS — BP 142/99 | HR 63 | Temp 98.4°F | Ht 73.0 in | Wt 195.3 lb

## 2019-06-18 DIAGNOSIS — M48061 Spinal stenosis, lumbar region without neurogenic claudication: Secondary | ICD-10-CM

## 2019-06-18 DIAGNOSIS — M545 Low back pain, unspecified: Secondary | ICD-10-CM

## 2019-06-18 DIAGNOSIS — Z79899 Other long term (current) drug therapy: Secondary | ICD-10-CM | POA: Diagnosis not present

## 2019-06-18 DIAGNOSIS — Z7951 Long term (current) use of inhaled steroids: Secondary | ICD-10-CM | POA: Diagnosis not present

## 2019-06-18 DIAGNOSIS — J45909 Unspecified asthma, uncomplicated: Secondary | ICD-10-CM

## 2019-06-18 DIAGNOSIS — G8929 Other chronic pain: Secondary | ICD-10-CM

## 2019-06-18 MED ORDER — DULERA 200-5 MCG/ACT IN AERO: 1.0000 | INHALATION_SPRAY | Freq: Two times a day (BID) | RESPIRATORY_TRACT | 3 refills | Status: DC

## 2019-06-18 MED ORDER — BACLOFEN 10 MG PO TABS: 5.0000 mg | ORAL_TABLET | Freq: Three times a day (TID) | ORAL | 0 refills | Status: AC | PRN

## 2019-06-18 MED ORDER — ALBUTEROL SULFATE HFA 108 (90 BASE) MCG/ACT IN AERS: 2.0000 | INHALATION_SPRAY | Freq: Four times a day (QID) | RESPIRATORY_TRACT | 3 refills | Status: DC | PRN

## 2019-06-18 MED FILL — ALBUTEROL SULFATE HFA 108 (: 108 (90 BAS | 75 days supply | Qty: 54 | Fill #0

## 2019-06-18 MED FILL — BACLOFEN 10 MG TABS: 10 | 14 days supply | Qty: 21 | Fill #0

## 2019-06-18 MED FILL — DULERA 200 MCG/5 MCG INH: 200-5 | 60 days supply | Qty: 13 | Fill #0

## 2019-06-18 NOTE — Progress Notes (Signed)
   CC: Back Pain, Asthma  HPI:  [name] is a 53 y.o. M with PMHx listed below presenting for Back Pain. Please see the A&P for the status of the patient's chronic medical problems.   Past Medical History:  Diagnosis Date  . Allergy   . Asthma   . Bilateral chronic knee pain   . Chronic back pain   . Chronic shoulder pain   . Depression   . Diabetes mellitus without complication ([place])    diagnosed in 2015  . GERD (gastroesophageal reflux disease)   . History of herniated intervertebral disc   . Hx of cardiovascular stress test    a. Lexiscan Myoview 10/29/12:  EF 56%, no ischemia  . Hx of echocardiogram    a. Echo 10/24/12:  EF 60-65%, normal wall thickness, normal diastolic fxn  . Hyperlipidemia   . Hypertension   . Reflux   . Renal disorder    chronic kidney disease   Review of Systems:  Performed and all others negative.  Physical Exam:  Vitals:   06/18/19 0913  BP: (!) 148/93  Pulse: 64  Temp: 98.4 F (36.9 C)  TempSrc: Oral  SpO2: 100%  Weight: 195 lb 4.8 oz (88.6 kg)  Height: 6\' 1"  (1.854 m)   Physical Exam Constitutional:      General: He is not in acute distress.    Appearance: Normal appearance.  Cardiovascular:     Rate and Rhythm: Normal rate and regular rhythm.     Pulses: Normal pulses.     Heart sounds: Normal heart sounds.  Pulmonary:     Effort: Pulmonary effort is normal. No respiratory distress.     Breath sounds: Normal breath sounds.  Abdominal:     General: Bowel sounds are normal. There is no distension.     Palpations: Abdomen is soft.     Tenderness: There is no abdominal tenderness.  Musculoskeletal:     Comments: Tenderness to palpation of midline lubar region. Tight Left paraspinal musculature, also mildly tender to palpation  Skin:    General: Skin is warm and dry.  Neurological:     General: No focal deficit present.     Mental Status: Mental status is at baseline.     Comments: No LE weakness nor sensory defcit  Bilat. L hip flexion somewhat limited by pain.     Assessment & Plan:   See Encounters Tab for problem based charting.   Patient discussed with Dr. Evette Doffing

## 2019-06-18 NOTE — Patient Instructions (Addendum)
Thank you for allowing [place] to care for you  For your acute pain flair - Baclofen prescribed to relax your muscles - Continue with heat, massage, and PRN pain control  For the chronic pain - We will schedule a follow up with your PCP for their next available to evaluate you once your pain flare is improved. The likely next step, if your pain remains function limiting, id further imaging.  Refill of inhalers provided  Schedule follow up with PCP for next available

## 2019-06-18 NOTE — Progress Notes (Signed)
Internal Medicine Clinic Attending  Case discussed with Dr. Melvin  at the time of the visit.  We reviewed the resident's history and exam and pertinent patient test results.  I agree with the assessment, diagnosis, and plan of care documented in the resident's note.  

## 2019-06-18 NOTE — Assessment & Plan Note (Signed)
Patient requesting refills for his inhalers. He has been taking both his Dulera and Albuterol twice a day, which is why he ran out so soon. He was instructed to continue to take his Dulera twice a day, but to take his Albuterol only as need for wheezing or shortness of breath (he states he has only had a couple of episodes of wheezing in the last 2 months) - Refill Dulera, 1 puff BID - Refill Albuterol, PRN

## 2019-06-18 NOTE — Assessment & Plan Note (Addendum)
Patient report continued gradual worsening of his chronic low back pain and an additional flare of pain for the past 3 days. His chronic pain has become for frequent (was intermittent before and is now daily). He has had physical therapy multiple time in the past that failed. MRI in 2013 showed Lumbar stenosis and associated disc bulging he has not had repeat imaging since that time.   As for his acute flare; he has had 3 days of worsened (9/10) LBP radiating to his left side (it typically radiates to his right). He has tried NSAIDs with only mild relief (to about 8/10) and has continue with heat and massage, also with mild relief.  His exam shows continued midline tenderness with new left paraspinal musculature that is tender, tight, and firm. No sensory/motor deficits on exam. His acute flare is likely due to muscle spasm given his exam findings. He reports drowsiness with flexeril in the past so will prescribe a trial of baclofen. - Baclofen 5mg  TID PRN - Continue with Heat and Massage - Continue with PRN NSAIDs  PCP follow for re-evaluation after flare. If his symptoms continue to affect his function (he was out of work for flare) then he may need repeat MRI since his previous MRI was 7 year ago. He will then need to be evaluated for neurosurgical referral.

## 2019-07-09 ENCOUNTER — Encounter: Payer: Medicare HMO | Admitting: Internal Medicine

## 2019-07-09 NOTE — Progress Notes (Deleted)
   CC: Diabetes follow up  HPI:  [name] is a 53 y.o. male with PMHx as documented below, presented for DM follow up. Please refer to problem based charting for further details and assessment of plan of current problem and chronic medical conditions.   Past Medical History:  Diagnosis Date  . Allergy   . Asthma   . Bilateral chronic knee pain   . Chronic back pain   . Chronic shoulder pain   . Depression   . Diabetes mellitus without complication ([place])    diagnosed in 2015  . GERD (gastroesophageal reflux disease)   . History of herniated intervertebral disc   . Hx of cardiovascular stress test    a. Lexiscan Myoview 10/29/12:  EF 56%, no ischemia  . Hx of echocardiogram    a. Echo 10/24/12:  EF 60-65%, normal wall thickness, normal diastolic fxn  . Hyperlipidemia   . Hypertension   . Reflux   . Renal disorder    chronic kidney disease   Review of Systems:  Review of Systems  Constitutional: Negative for chills and fever.  Respiratory: Negative for cough and hemoptysis.   Musculoskeletal: Positive for back pain and myalgias.  Neurological: Negative for dizziness.    Physical Exam:  There were no vitals filed for this visit. Physical Exam  Constitutional: ***Well-developed and well-nourished. No acute distress.  Cardiovascular: *** RRR, nl S1S2, ***no murmur, *** no LEE Respiratory: ***Effort normal and breath sounds normal. No respiratory distress. No wheezes.  GI: ***Soft. Bowel sounds are normal. No distension. There is no tenderness.  Neurological: Is alert and oriented x 3 *** MSK: Normal ROM ***   Assessment & Plan:   See Encounters Tab for problem based charting.  Patient discussed with Dr. {NAMES:[id number]::"Butcher","Guilloud","Hoffman","Mullen","Narendra","Raines","Vincent"}

## 2019-07-09 NOTE — Assessment & Plan Note (Deleted)
Patient is on Metformin 500 mg QD... Tolerated it well.  -HbA1c today -Continue Metformin 500 mg QD -BMP today

## 2019-07-20 ENCOUNTER — Encounter: Payer: Self-pay | Admitting: Internal Medicine

## 2019-07-20 ENCOUNTER — Ambulatory Visit (INDEPENDENT_AMBULATORY_CARE_PROVIDER_SITE_OTHER): Payer: Medicare HMO | Admitting: Internal Medicine

## 2019-07-20 ENCOUNTER — Other Ambulatory Visit: Payer: Self-pay

## 2019-07-20 VITALS — BP 147/90 | HR 76 | Temp 98.2°F | Ht 73.0 in | Wt 193.2 lb

## 2019-07-20 DIAGNOSIS — M7712 Lateral epicondylitis, left elbow: Secondary | ICD-10-CM

## 2019-07-20 DIAGNOSIS — M5136 Other intervertebral disc degeneration, lumbar region: Secondary | ICD-10-CM

## 2019-07-20 DIAGNOSIS — Z791 Long term (current) use of non-steroidal anti-inflammatories (NSAID): Secondary | ICD-10-CM

## 2019-07-20 DIAGNOSIS — E119 Type 2 diabetes mellitus without complications: Secondary | ICD-10-CM | POA: Diagnosis not present

## 2019-07-20 DIAGNOSIS — I10 Essential (primary) hypertension: Secondary | ICD-10-CM

## 2019-07-20 DIAGNOSIS — R6889 Other general symptoms and signs: Secondary | ICD-10-CM | POA: Diagnosis not present

## 2019-07-20 DIAGNOSIS — Z79899 Other long term (current) drug therapy: Secondary | ICD-10-CM | POA: Diagnosis not present

## 2019-07-20 DIAGNOSIS — M48061 Spinal stenosis, lumbar region without neurogenic claudication: Secondary | ICD-10-CM

## 2019-07-20 DIAGNOSIS — G8929 Other chronic pain: Secondary | ICD-10-CM | POA: Diagnosis not present

## 2019-07-20 DIAGNOSIS — M545 Low back pain: Secondary | ICD-10-CM

## 2019-07-20 MED ORDER — IBUPROFEN 400 MG PO TABS: 400.0000 mg | ORAL_TABLET | Freq: Four times a day (QID) | ORAL | 0 refills | Status: DC | PRN

## 2019-07-20 MED FILL — IBUPROFEN 400 MG TABS: 400 | 7 days supply | Qty: 30 | Fill #0

## 2019-07-20 NOTE — Progress Notes (Signed)
   CC: Low back pain f/u HPI:  [name] is a 53 y.o. male with PMHx as documented below, presented with for F/u of back pain and also complaining of left elbow pain. Please refer to problem based charting for further details and assessment of plan of current problem and chronic medical conditions.   Past Medical History:  Diagnosis Date  . Allergy   . Asthma   . Bilateral chronic knee pain   . Chronic back pain   . Chronic shoulder pain   . Depression   . Diabetes mellitus without complication ([place])    diagnosed in 2015  . GERD (gastroesophageal reflux disease)   . History of herniated intervertebral disc   . Hx of cardiovascular stress test    a. Lexiscan Myoview 10/29/12:  EF 56%, no ischemia  . Hx of echocardiogram    a. Echo 10/24/12:  EF 60-65%, normal wall thickness, normal diastolic fxn  . Hyperlipidemia   . Hypertension   . Reflux   . Renal disorder    chronic kidney disease   Review of Systems:  Review of Systems  Neurological: Negative for tingling, sensory change and weakness.    Physical Exam:   Vitals:   07/20/19 1437  BP: (!) 147/90  Pulse: 76  Temp: 98.2 F (36.8 C)  TempSrc: Oral  SpO2: 100%  Weight: 193 lb 3.2 oz (87.6 kg)  Height: 6\' 1"  (1.854 m)   Physical Exam Constitutional:      General: He is not in acute distress.    Appearance: Normal appearance. He is not ill-appearing.     Comments: Uncomfortable during the exam due to back pain  HENT:     Head: Normocephalic and atraumatic.  Cardiovascular:     Rate and Rhythm: Normal rate and regular rhythm.     Pulses: Normal pulses.     Heart sounds: Normal heart sounds.  Pulmonary:     Effort: Pulmonary effort is normal. No respiratory distress.     Breath sounds: Normal breath sounds. No wheezing or rales.  Abdominal:     General: There is no distension.     Tenderness: There is no abdominal tenderness.  Musculoskeletal:        General: Tenderness present.     Comments:  Tenderness on lateral aspect of left elbow, pain with pronation  Tenderness on lumbar spine and bilateral paraspinal muscles   Neurological:     General: No focal deficit present.     Mental Status: He is alert and oriented to person, place, and time.     Sensory: No sensory deficit.     Motor: No weakness.     Comments: Negative SLR and reverse SLR   Psychiatric:        Mood and Affect: Mood normal.        Behavior: Behavior normal.     Assessment & Plan:   See Encounters Tab for problem based charting.  Patient discussed with Dr. Philipp Ovens

## 2019-07-20 NOTE — Patient Instructions (Addendum)
It was our pleasure taking care of you in our clinic today.  You were seen due to low back pain and elbow pain. I prescribe Ibuprophen for your elbow and to be taken with food as needed. (It will be a short term medication until we repeat your kidney function). I order MRI of your back and will call you when result is ready and if abnormal. You can continue taking muscle relaxant for back pain meanwhile.  I understand that you prefer not to do any lab work today or addressing your blood pressure and diabetes. We will need to address that on next visit.   Please take your medications as instructed and contact us if you have any question or concern.    Please come back to clinic in 2 weeks if your elbow pain did not get better or earlier if your back pain gets worse. As always, if having severe symptoms, please go to emergency room for further management.  Thank you

## 2019-07-20 NOTE — Assessment & Plan Note (Signed)
Patient reports left elbow pain since last week. It hurts when he moves his elbow. No trauma but he works in [employer] and uses his elbow a lot.  On exam, he has tenderness on lateral epicondyl area, and pain with pronation. No NV deficit. His presentation is suggestive of lateral epicondylitis.  -Ibuprofen 400 mg Q 6h PRN (patient had slight Cr elevation on last BMP. He is not interested in doing lab work today. I Informed him that he should just take the NSAID as needed until we assess renal function with BMP) -F/u in clinic in 2 weeks if no improvement -Can wrap his elbow at home and use cold/warm compress as needed

## 2019-07-20 NOTE — Assessment & Plan Note (Signed)
BP today is 147/90 Patient reports that he sometimes skip his Lisinopril. He agrees to resume that again. He does not like to do lab work today and prefers to address his HTN on next visit.  -Continue Lisinopril 40 mg QD -BMP next visit

## 2019-07-20 NOTE — Assessment & Plan Note (Addendum)
Patient continues to have low back pain, mostly with standing walking and even with sitting for long time. The pain is on bilateral lower back and he reports radiation to his flanks sometimes. Denies any numbness, tingling, weakness of lower extremities. No urinary or fecal incontinency. No saddle anesthesia.  He mentions that his pain interfere with daily activity now and he can not walk a block these days. On exam: He has tenderness on spine and paraspinal area of lower back. SLR negative. Motor and sensory intact.  He has Hx of spinal canal stenosis and some degenerative disk disease on MRI 7 years ago. Will repeat the MRI.  No red flag for urgent referral to neurosurgery at this point.  -Lumbar MRI -f/u in clinic in 2 weeks if worsening of pain. (Explained the red flag to the patient) -Can continue muscle relaxant and NSAID as needed

## 2019-07-21 NOTE — Progress Notes (Signed)
Internal Medicine Clinic Attending  Case discussed with Dr. Masoudi  at the time of the visit.  We reviewed the resident's history and exam and pertinent patient test results.  I agree with the assessment, diagnosis, and plan of care documented in the resident's note.  

## 2019-07-31 MED FILL — IBUPROFEN 400 MG TABS: 400 | 7 days supply | Qty: 30 | Fill #0

## 2019-08-10 ENCOUNTER — Telehealth: Payer: Self-pay | Admitting: *Deleted

## 2019-08-10 ENCOUNTER — Other Ambulatory Visit: Payer: Self-pay | Admitting: Internal Medicine

## 2019-08-10 DIAGNOSIS — J45909 Unspecified asthma, uncomplicated: Secondary | ICD-10-CM

## 2019-08-10 MED ORDER — BUDESONIDE-FORMOTEROL FUMARATE 160-4.5 MCG/ACT IN AERO: 2.0000 | INHALATION_SPRAY | Freq: Two times a day (BID) | RESPIRATORY_TRACT | 2 refills | Status: DC

## 2019-08-10 NOTE — Progress Notes (Signed)
Replaced Dulera with Symbicort to be covered by insurance. [facility] to [facility].

## 2019-08-10 NOTE — Telephone Encounter (Signed)
Received fax from [facility] requesting alternative to [facility]. Covered alternatives:   Breo Ellipt powder for inhalation 100-85mcg or 200-25 mcg  Fluticasone propionate inhalation powder blister 100-50 mcg or 250-50 mcg or 500-50 mcg  Symbicort HFA Aerosol Inhaler 160-4.5 mcg or 80-4.5 mcg  Please send new Rx for a covered alternative to [facility]

## 2019-08-10 NOTE — Addendum Note (Signed)
Addended byDewayne Hatch on: 08/10/2019 03:25 PM   Modules accepted: Orders

## 2019-08-11 NOTE — Telephone Encounter (Signed)
Patient made aware that Ruthe Mannan is not covered by insurance and PCP has sent Rx for Symbicort to [facility]. Hubbard Hartshorn, BSN, RN-BC

## 2019-08-28 ENCOUNTER — Encounter: Payer: Self-pay | Admitting: Dietician

## 2019-08-31 ENCOUNTER — Ambulatory Visit (HOSPITAL_COMMUNITY): Admission: RE | Admit: 2019-08-31 | Payer: Medicare HMO | Source: Ambulatory Visit

## 2019-09-03 ENCOUNTER — Encounter: Payer: Self-pay | Admitting: Internal Medicine

## 2019-09-03 DIAGNOSIS — E1122 Type 2 diabetes mellitus with diabetic chronic kidney disease: Secondary | ICD-10-CM

## 2019-09-03 DIAGNOSIS — N182 Chronic kidney disease, stage 2 (mild): Secondary | ICD-10-CM

## 2019-09-04 MED ORDER — METFORMIN HCL ER 500 MG PO TB24: 500.0000 mg | ORAL_TABLET | Freq: Every day | ORAL | 1 refills | Status: DC

## 2019-09-04 MED FILL — METFORMIN HCL ER 500 MG TB2: 500 | 90 days supply | Qty: 90 | Fill #0

## 2019-09-05 ENCOUNTER — Ambulatory Visit (HOSPITAL_COMMUNITY): Payer: Medicare HMO | Attending: Internal Medicine

## 2019-09-14 ENCOUNTER — Other Ambulatory Visit: Payer: Self-pay | Admitting: Internal Medicine

## 2019-09-14 DIAGNOSIS — M7712 Lateral epicondylitis, left elbow: Secondary | ICD-10-CM

## 2019-09-14 MED ORDER — IBUPROFEN 400 MG PO TABS: 400.0000 mg | ORAL_TABLET | Freq: Four times a day (QID) | ORAL | 0 refills | Status: DC | PRN

## 2019-09-14 MED FILL — IBUPROFEN 400 MG TABS: 400 | 8 days supply | Qty: 30 | Fill #0

## 2019-09-14 MED FILL — metFORMIN HCL ER 500 MG TB2: 500 | 90 days supply | Qty: 90 | Fill #0

## 2019-10-10 ENCOUNTER — Encounter: Payer: Self-pay | Admitting: Internal Medicine

## 2019-11-21 ENCOUNTER — Ambulatory Visit: Payer: Medicare HMO | Attending: Internal Medicine

## 2019-11-21 DIAGNOSIS — Z23 Encounter for immunization: Secondary | ICD-10-CM

## 2019-11-21 NOTE — Progress Notes (Signed)
   Covid-19 Vaccination Clinic  Name:  JAQUAVION MCCANNON    MRN: [id number] DOB: 05-03-1966  11/21/2019  Mr. Wiegand was observed post Covid-19 immunization for 15 minutes without incident. He was provided with Vaccine Information Sheet and instruction to access the V-Safe system.   Mr. Stellmach was instructed to call 911 with any severe reactions post vaccine: Marland Kitchen Difficulty breathing  . Swelling of face and throat  . A fast heartbeat  . A bad rash all over body  . Dizziness and weakness   Immunizations Administered    Name Date Dose VIS Date Route   Pfizer COVID-19 Vaccine 11/21/2019  3:52 PM 0.3 mL 08/28/2019 Intramuscular   Manufacturer: [employer], [employer]   Lot: [id number]   NDC: [address]-1

## 2019-11-23 ENCOUNTER — Telehealth: Payer: Self-pay | Admitting: Internal Medicine

## 2019-12-09 ENCOUNTER — Encounter: Payer: Self-pay | Admitting: Internal Medicine

## 2019-12-09 ENCOUNTER — Other Ambulatory Visit: Payer: Self-pay | Admitting: Internal Medicine

## 2019-12-09 DIAGNOSIS — I10 Essential (primary) hypertension: Secondary | ICD-10-CM

## 2019-12-09 DIAGNOSIS — J45909 Unspecified asthma, uncomplicated: Secondary | ICD-10-CM

## 2019-12-09 MED ORDER — LISINOPRIL 40 MG PO TABS: 40.0000 mg | ORAL_TABLET | Freq: Every day | ORAL | 1 refills | Status: DC

## 2019-12-09 MED ORDER — ALBUTEROL SULFATE HFA 108 (90 BASE) MCG/ACT IN AERS: 2.0000 | INHALATION_SPRAY | Freq: Four times a day (QID) | RESPIRATORY_TRACT | 3 refills | Status: DC | PRN

## 2019-12-09 MED FILL — ALBUTEROL SULFATE HFA 108 (: 108 (90 BAS | 75 days supply | Qty: 54 | Fill #0

## 2019-12-09 MED FILL — LISINOPRIL 40 MG TABLET: 40 | 90 days supply | Qty: 90 | Fill #0

## 2019-12-09 NOTE — Telephone Encounter (Signed)
Good Afternoon.  We have submitted your request to have your MRI Authorized.  Your Request has now been moved to Peer- to Peer Review.   We will continue to update you with the Authorization Process.  Please feel free to Contact our office with any questions.  Thank you and we look forward to speaking with you soon.

## 2019-12-12 ENCOUNTER — Ambulatory Visit: Payer: Medicare HMO | Attending: Internal Medicine

## 2019-12-12 DIAGNOSIS — Z23 Encounter for immunization: Secondary | ICD-10-CM

## 2019-12-12 NOTE — Progress Notes (Signed)
   Covid-19 Vaccination Clinic  Name:  Javier Beasley    MRN: [id number] DOB: 1966/03/21  12/12/2019  Javier Beasley was observed post Covid-19 immunization for 15 minutes without incident. He was provided with Vaccine Information Sheet and instruction to access the V-Safe system.   Javier Beasley was instructed to call 911 with any severe reactions post vaccine: Marland Kitchen Difficulty breathing  . Swelling of face and throat  . A fast heartbeat  . A bad rash all over body  . Dizziness and weakness   Immunizations Administered    Name Date Dose VIS Date Route   Pfizer COVID-19 Vaccine 12/12/2019  2:12 PM 0.3 mL 08/28/2019 Intramuscular   Manufacturer: [employer], [employer]   Lot: [id number]   NDC: [address]-5

## 2019-12-16 NOTE — Telephone Encounter (Signed)
Spoke with the patient and he has been sch with [facility] on 12/23/2019 @ 1 pm.

## 2019-12-23 ENCOUNTER — Ambulatory Visit (HOSPITAL_COMMUNITY)
Admission: RE | Admit: 2019-12-23 | Discharge: 2019-12-23 | Disposition: A | Discharge status: Home / Self Care | Payer: Medicare HMO | Source: Ambulatory Visit | Attending: Internal Medicine | Admitting: Internal Medicine

## 2019-12-23 ENCOUNTER — Other Ambulatory Visit: Payer: Self-pay

## 2019-12-23 DIAGNOSIS — M545 Low back pain: Secondary | ICD-10-CM | POA: Diagnosis not present

## 2019-12-23 DIAGNOSIS — M48061 Spinal stenosis, lumbar region without neurogenic claudication: Secondary | ICD-10-CM | POA: Diagnosis not present

## 2019-12-23 DIAGNOSIS — G8929 Other chronic pain: Secondary | ICD-10-CM | POA: Insufficient documentation

## 2019-12-23 DIAGNOSIS — M5126 Other intervertebral disc displacement, lumbar region: Secondary | ICD-10-CM | POA: Diagnosis not present

## 2020-01-13 ENCOUNTER — Telehealth: Payer: Self-pay | Admitting: Internal Medicine

## 2020-01-13 NOTE — Telephone Encounter (Signed)
Attempted to call Javier Beasley regarding his MRI result and to ask how is his back pain. No response.  Will try again next days.

## 2020-03-15 MED FILL — metFORMIN HCL ER 500 MG TB2: 500 | 90 days supply | Qty: 90 | Fill #1

## 2020-03-15 MED FILL — LISINOPRIL 40 MG TABLET: 40 | 90 days supply | Qty: 90 | Fill #1

## 2020-03-15 MED FILL — ALBUTEROL SULFATE HFA 108 (: 108 (90 BAS | 75 days supply | Qty: 54 | Fill #1

## 2020-05-03 ENCOUNTER — Other Ambulatory Visit: Payer: Self-pay

## 2020-05-03 ENCOUNTER — Ambulatory Visit (INDEPENDENT_AMBULATORY_CARE_PROVIDER_SITE_OTHER): Payer: Medicare HMO | Admitting: Internal Medicine

## 2020-05-03 VITALS — BP 159/92 | HR 68 | Temp 98.2°F | Ht 73.0 in | Wt 201.6 lb

## 2020-05-03 DIAGNOSIS — I129 Hypertensive chronic kidney disease with stage 1 through stage 4 chronic kidney disease, or unspecified chronic kidney disease: Secondary | ICD-10-CM | POA: Diagnosis not present

## 2020-05-03 DIAGNOSIS — I10 Essential (primary) hypertension: Secondary | ICD-10-CM

## 2020-05-03 DIAGNOSIS — N182 Chronic kidney disease, stage 2 (mild): Secondary | ICD-10-CM | POA: Diagnosis not present

## 2020-05-03 DIAGNOSIS — E785 Hyperlipidemia, unspecified: Secondary | ICD-10-CM

## 2020-05-03 DIAGNOSIS — E1122 Type 2 diabetes mellitus with diabetic chronic kidney disease: Secondary | ICD-10-CM

## 2020-05-03 LAB — GLUCOSE, CAPILLARY: Glucose-Capillary: 184 mg/dL — ABNORMAL HIGH (ref 70–99)

## 2020-05-03 LAB — POCT GLYCOSYLATED HEMOGLOBIN (HGB A1C): Hemoglobin A1C: 7.1 % — AB (ref 4.0–5.6)

## 2020-05-03 MED ORDER — ROSUVASTATIN CALCIUM 40 MG PO TABS: 20.0000 mg | ORAL_TABLET | Freq: Every day | ORAL | 3 refills | Status: DC

## 2020-05-03 MED FILL — ROSUVASTATIN CALCIUM 40 MG: 40 | 90 days supply | Qty: 45 | Fill #0

## 2020-05-03 NOTE — Patient Instructions (Signed)
Thank you for allowing [place] to provide your care today. Today we discussed your diabetes and high blood pressure. As we discussed, it is important to your medications every day to control your diabetes and also your blood pressure. Your blood pressure today was elevated at 159/89. (The goal is around 120/80). I understand that you are not willing to make any changes to medications today. So that, let us continue with current medications, diet changes (try to eat less fast food, less salt and more vegetables) and will reevaluate you in 3 months.  I have ordered some blood work for you. I will call if any are abnormal.  Send a refill for your cholesterol medicine per your request.  Please come back to clinic in 3 months or earlier needed. As always, if having severe symptoms, please seek medical attention at emergency room. Should you have any questions or concerns please call the internal medicine clinic at [phone].    Thank you!

## 2020-05-03 NOTE — Progress Notes (Signed)
   CC: DM f/u and HTN  HPI:  [name] is a 54 y.o. male with PMHx as documented below, presented for f/u of DM and HTN. Please refer to problem based charting for further details and assessment and plan of current problem and chronic medical conditions.   Past Medical History:  Diagnosis Date  . Allergy   . Asthma   . Bilateral chronic knee pain   . Chronic back pain   . Chronic shoulder pain   . Depression   . Diabetes mellitus without complication ([facility])    diagnosed in 2015  . GERD (gastroesophageal reflux disease)   . History of herniated intervertebral disc   . Hx of cardiovascular stress test    a. Lexiscan Myoview 10/29/12:  EF 56%, no ischemia  . Hx of echocardiogram    a. Echo 10/24/12:  EF 60-65%, normal wall thickness, normal diastolic fxn  . Hyperlipidemia   . Hypertension   . Reflux   . Renal disorder    chronic kidney disease   Review of Systems:  Constitutional: Negative for chills and fever.  Respiratory: Negative for shortness of breath.   Cardiovascular: Negative for chest pain and leg swelling.  Gastrointestinal: Negative for abdominal pain, nausea and vomiting.  Neurological: Negative for dizziness and headaches.    Physical Exam:  Vitals:   05/03/20 1039  BP: (!) 159/92  Pulse: 68  Temp: 98.2 F (36.8 C)  TempSrc: Oral  SpO2: 100%  Weight: 201 lb 9.6 oz (91.4 kg)  Height: 6\' 1"  (1.854 m)   Constitutional: Well-developed and well-nourished. No acute distress.  HENT:  Head: Normocephalic and atraumatic.  Eyes: Conjunctivae are normal, EOM nl Cardiovascular:  RRR, nl S1S2, no murmur,  no LEE Respiratory: Effort normal and breath sounds normal. No respiratory distress. No wheezes.  GI: Soft. Bowel sounds are normal. No distension. There is no tenderness.  Neurological: Is alert and oriented x 3  Skin: Not diaphoretic. No erythema.  Psychiatric:Slightly irritable, Behavior is normal. Judgment and thought content normal.    Assessment  & Plan:   See Encounters Tab for problem based charting.  Patient discussed with Dr. 

## 2020-05-04 LAB — BMP8+ANION GAP
Anion Gap: 15 mmol/L (ref 10.0–18.0)
BUN/Creatinine Ratio: 10 (ref 9–20)
BUN: 11 mg/dL (ref 6–24)
CO2: 23 mmol/L (ref 20–29)
Calcium: 9.9 mg/dL (ref 8.7–10.2)
Chloride: 100 mmol/L (ref 96–106)
Creatinine, Ser: 1.08 mg/dL (ref 0.76–1.27)
GFR calc Af Amer: 89 mL/min/{1.73_m2} (ref >59)
GFR calc non Af Amer: 77 mL/min/{1.73_m2} (ref >59)
Glucose: 115 mg/dL — ABNORMAL HIGH (ref 65–99)
Potassium: 4.3 mmol/L (ref 3.5–5.2)
Sodium: 138 mmol/L (ref 134–144)

## 2020-05-04 NOTE — Assessment & Plan Note (Addendum)
Patient blood pressure today is 159/92.  He is asymptomatic, without headache, dizziness, chest pain.  Heart rate of 68. Uncontrolled hypertension in setting of nonadherence to medications. He cannot tell me how many times a week he might have missed his medications.    -lisinopril 40 mg daily -BMP

## 2020-05-04 NOTE — Assessment & Plan Note (Signed)
Hemoglobin A1c today is 7.1. Metformin 500 mg daily was ordered before but he did not take it because he heard it messes up with his kidney.  All of his questions and concerns were addressed.  We had an extensive discussion about his normal kidney function, benefits of a medication such as Metformin, and about renal complication of untreated diabetes and hypertension.   He agrees to restart Metformin. I recommended to increase the dose, as his HbA1c is mildly above the goal. He clearly mentions that he is not going to do so. After a shared decision making, we planned to resume Metformin 500 mg QD and he agrees to f/u in clinic in 3 months to recheck HbA1c.  -Resume Metformin 500 mg QD -F/u in clinic in 3 months to repeat A1c -Life style modification for weight loss, exercise and NASH diet

## 2020-05-06 NOTE — Progress Notes (Signed)
Internal Medicine Clinic Attending  Case discussed with Dr. Masoudi  At the time of the visit.  We reviewed the resident's history and exam and pertinent patient test results.  I agree with the assessment, diagnosis, and plan of care documented in the resident's note.  

## 2020-05-27 ENCOUNTER — Other Ambulatory Visit: Payer: Self-pay | Admitting: Hematology & Oncology

## 2020-05-30 ENCOUNTER — Other Ambulatory Visit (HOSPITAL_COMMUNITY): Payer: Self-pay | Admitting: Ophthalmology

## 2020-05-30 DIAGNOSIS — Z0100 Encounter for examination of eyes and vision without abnormal findings: Secondary | ICD-10-CM | POA: Diagnosis not present

## 2020-05-30 DIAGNOSIS — H5213 Myopia, bilateral: Secondary | ICD-10-CM | POA: Diagnosis not present

## 2020-05-30 DIAGNOSIS — H401131 Primary open-angle glaucoma, bilateral, mild stage: Secondary | ICD-10-CM | POA: Diagnosis not present

## 2020-05-30 DIAGNOSIS — H25013 Cortical age-related cataract, bilateral: Secondary | ICD-10-CM | POA: Diagnosis not present

## 2020-05-30 DIAGNOSIS — H524 Presbyopia: Secondary | ICD-10-CM | POA: Diagnosis not present

## 2020-05-30 DIAGNOSIS — H2513 Age-related nuclear cataract, bilateral: Secondary | ICD-10-CM | POA: Diagnosis not present

## 2020-05-30 DIAGNOSIS — E1136 Type 2 diabetes mellitus with diabetic cataract: Secondary | ICD-10-CM | POA: Diagnosis not present

## 2020-05-30 LAB — HM DIABETES EYE EXAM

## 2020-05-30 MED FILL — LATANOPROST 0.005% EYE DRP: 0.005 | 25 days supply | Qty: 3 | Fill #0

## 2020-07-08 MED FILL — ALBUTEROL SULFATE HFA 108 (: 108 (90 BAS | 75 days supply | Qty: 54 | Fill #0

## 2020-07-15 ENCOUNTER — Other Ambulatory Visit (HOSPITAL_COMMUNITY): Payer: Self-pay | Admitting: Ophthalmology

## 2020-07-15 DIAGNOSIS — H401131 Primary open-angle glaucoma, bilateral, mild stage: Secondary | ICD-10-CM | POA: Diagnosis not present

## 2020-07-15 MED FILL — LATANOPROST 0.005% EYE DRP: 0.005 | 19 days supply | Qty: 3 | Fill #0

## 2020-08-02 MED FILL — LATANOPROST 0.005% EYE DRP: 0.005 | 19 days supply | Qty: 3 | Fill #1

## 2020-08-04 ENCOUNTER — Other Ambulatory Visit: Payer: Self-pay | Admitting: Internal Medicine

## 2020-08-04 DIAGNOSIS — E1122 Type 2 diabetes mellitus with diabetic chronic kidney disease: Secondary | ICD-10-CM

## 2020-08-05 ENCOUNTER — Other Ambulatory Visit: Payer: Self-pay | Admitting: Student

## 2020-08-05 MED FILL — METFORMIN HCL ER 500 MG TB2: 500 | 90 days supply | Qty: 90 | Fill #0

## 2020-08-15 DIAGNOSIS — H401131 Primary open-angle glaucoma, bilateral, mild stage: Secondary | ICD-10-CM | POA: Diagnosis not present

## 2020-08-15 MED FILL — METFORMIN HCL ER 500 MG TB2: 500 | 90 days supply | Qty: 90 | Fill #0

## 2020-08-29 MED FILL — LATANOPROST 0.005% EYE DRP: 0.005 | 19 days supply | Qty: 3 | Fill #2

## 2020-09-26 MED FILL — ALBUTEROL SULFATE HFA 108 (: 108 (90 BAS | 75 days supply | Qty: 54 | Fill #1

## 2020-09-28 MED FILL — LATANOPROST 0.005% EYE DRP: 0.005 | 19 days supply | Qty: 3 | Fill #3

## 2020-11-04 ENCOUNTER — Encounter: Payer: Self-pay | Admitting: *Deleted

## 2020-11-04 NOTE — Progress Notes (Signed)

## 2020-11-08 NOTE — Progress Notes (Addendum)
Things That May Be Affecting Your Health:  Alcohol  Hearing loss  Pain    Depression  Home Safety  Sexual Health  x Diabetes  Lack of physical activity  Stress   Difficulty with daily activities  Loneliness  Tiredness   Drug use x Medicines  Tobacco use   Falls  Motor Vehicle Safety x Weight   Food choices  Oral Health  Other    YOUR PERSONALIZED HEALTH PLAN : 1. Schedule your next subsequent Medicare Wellness visit in one year 2. Attend all of your regular appointments to address your medical issues 3. Complete the preventative screenings and services   Annual Wellness Visit   Medicare Covered Preventative Screenings and Services  Services & Screenings Men and Women Who How Often Need? Date of Last Service Action  Abdominal Aortic Aneurysm Adults with AAA risk factors Once      Alcohol Misuse and Counseling All Adults Screening once a year if no alcohol misuse. Counseling up to 4 face to face sessions.     Bone Density Measurement  Adults at risk for osteoporosis Once every 2 yrs      Lipid Panel Z13.6 All adults without CV disease Once every 5 yrs       Colorectal Cancer   Stool sample or  Colonoscopy All adults 50 and older   Once every year  Every 10 years        Depression All Adults Once a year  Today   Diabetes Screening Blood glucose, post glucose load, or GTT Z13.1  All adults at risk  Pre-diabetics  Once per year  Twice per year      Diabetes  Self-Management Training All adults Diabetics 10 hrs first year; 2 hours subsequent years. Requires Copay     Glaucoma  Diabetics  Family history of glaucoma  African Americans 50 yrs +  Hispanic Americans 65 yrs + Annually - requires coppay      Hepatitis C Z72.89 or F19.20  High Risk for HCV  Born between 1945 and 1965  Annually  Once      HIV Z11.4 All adults based on risk  Annually btw ages 39 & 31 regardless of risk  Annually > 65 yrs if at increased risk      Lung Cancer Screening  Asymptomatic adults aged 82-77 with 30 pack yr history and current smoker OR quit within the last 15 yrs Annually Must have counseling and shared decision making documentation before first screen      Medical Nutrition Therapy Adults with   Diabetes  Renal disease  Kidney transplant within past 3 yrs 3 hours first year; 2 hours subsequent years     Obesity and Counseling All adults Screening once a year Counseling if BMI 30 or higher  Today   Tobacco Use Counseling Adults who use tobacco  Up to 8 visits in one year     Vaccines Z23  Hepatitis B  Influenza   Pneumonia  Adults   Once  Once every flu season  Two different vaccines separated by one year     Next Annual Wellness Visit People with Medicare Every year  Today     Services & Screenings Women Who How Often Need  Date of Last Service Action  Mammogram  Z12.31 Women over 40 One baseline ages 60-39. Annually ager 40 yrs+      Pap tests All women Annually if high risk. Every 2 yrs for normal risk women  Screening for cervical cancer with   Pap (Z01.419 nl or Z01.411abnl) &  HPV Z11.51 Women aged 39 to 21 Once every 5 yrs     Screening pelvic and breast exams All women Annually if high risk. Every 2 yrs for normal risk women     Sexually Transmitted Diseases  Chlamydia  Gonorrhea  Syphilis All at risk adults Annually for non pregnant females at increased risk         Services & Screenings Men Who How Ofter Need  Date of Last Service Action  Prostate Cancer - DRE & PSA Men over 50 Annually.  DRE might require a copay.        Sexually Transmitted Diseases  Syphilis All at risk adults Annually for men at increased risk      Health Maintenance List Health Maintenance  Topic Date Due  . Hepatitis C Screening  Never done  . PNEUMOCOCCAL POLYSACCHARIDE VACCINE AGE 31-64 HIGH RISK  Never done  . COLON CANCER SCREENING ANNUAL FOBT  07/22/2019  . LIPID PANEL  09/19/2019  . INFLUENZA VACCINE   04/17/2020  . OPHTHALMOLOGY EXAM  05/04/2020  . COVID-19 Vaccine (3 - Booster for Pfizer series) 06/13/2020  . HEMOGLOBIN A1C  11/03/2020  . FOOT EXAM  05/03/2021  . TETANUS/TDAP  03/05/2022  . COLONOSCOPY (Pts 45-64yrs Insurance coverage will need to be confirmed)  05/21/2028  . HIV Screening  Completed

## 2020-11-14 MED FILL — LATANOPROST 0.005% EYE DRP: 0.005 | 25 days supply | Qty: 3 | Fill #1

## 2020-11-15 ENCOUNTER — Encounter: Payer: Self-pay | Admitting: Internal Medicine

## 2020-11-23 ENCOUNTER — Other Ambulatory Visit: Payer: Self-pay | Admitting: Internal Medicine

## 2020-11-23 DIAGNOSIS — I10 Essential (primary) hypertension: Secondary | ICD-10-CM

## 2020-11-23 NOTE — Telephone Encounter (Signed)
This patient is overdue for follow-up. Can he be scheduled for follow-up?

## 2020-12-02 ENCOUNTER — Other Ambulatory Visit (HOSPITAL_COMMUNITY): Payer: Self-pay | Admitting: Ophthalmology

## 2020-12-02 DIAGNOSIS — H401131 Primary open-angle glaucoma, bilateral, mild stage: Secondary | ICD-10-CM | POA: Diagnosis not present

## 2020-12-06 ENCOUNTER — Encounter: Payer: Self-pay | Admitting: Dietician

## 2020-12-07 ENCOUNTER — Other Ambulatory Visit: Payer: Self-pay

## 2020-12-07 ENCOUNTER — Encounter: Payer: Self-pay | Admitting: Internal Medicine

## 2020-12-07 ENCOUNTER — Ambulatory Visit (INDEPENDENT_AMBULATORY_CARE_PROVIDER_SITE_OTHER): Payer: Medicare HMO | Admitting: Internal Medicine

## 2020-12-07 ENCOUNTER — Other Ambulatory Visit: Payer: Self-pay | Admitting: *Deleted

## 2020-12-07 DIAGNOSIS — N182 Chronic kidney disease, stage 2 (mild): Secondary | ICD-10-CM | POA: Diagnosis not present

## 2020-12-07 DIAGNOSIS — E1122 Type 2 diabetes mellitus with diabetic chronic kidney disease: Secondary | ICD-10-CM | POA: Diagnosis not present

## 2020-12-07 DIAGNOSIS — I10 Essential (primary) hypertension: Secondary | ICD-10-CM

## 2020-12-07 DIAGNOSIS — Z0000 Encounter for general adult medical examination without abnormal findings: Secondary | ICD-10-CM

## 2020-12-07 DIAGNOSIS — E785 Hyperlipidemia, unspecified: Secondary | ICD-10-CM

## 2020-12-07 DIAGNOSIS — J45909 Unspecified asthma, uncomplicated: Secondary | ICD-10-CM

## 2020-12-07 NOTE — Progress Notes (Signed)
Internal Medicine Clinic Attending  Case discussed with Dr.  Winters  at the time of the visit.  We reviewed the AWV findings.  I agree with the assessment, diagnosis, and plan of care documented in the AWV note.     

## 2020-12-07 NOTE — Progress Notes (Signed)
This AWV is being conducted by TELEHEALTH - AUDIO only. The patient was located at home and I was located in [facility]. The patient's identity was confirmed using their DOB and current address. The patient or his/her legal guardian has consented to being evaluated through a telephone encounter and understands the associated risks (an examination cannot be done and the patient may need to come in for an appointment) / benefits (allows the patient to remain at home, decreasing exposure to coronavirus). I personally spent 35 minutes conducting the AWV.  Subjective:   Javier Beasley is a 55 y.o. male who presents for a Medicare Annual Wellness Visit.  The following items have been reviewed and updated today in the appropriate area in the EMR.   Health Risk Assessment  Height, weight, BMI, and BP Visual acuity if needed Depression screen Fall risk / safety level Advance directive discussion Medical and family history were reviewed and updated Updating list of other providers & suppliers Medication reconciliation, including over the counter medicines Cognitive screen Written screening schedule Risk Factor list Personalized health advice, risky behaviors, and treatment advice  Social History   Social History Narrative   Current Social History 12/07/2020        Patient lives alone in a ground floor apartment.. There are not steps up to the entrance the patient uses.       Patient's method of transportation is personal car.      The highest level of education was high school diploma.      The patient currently disabled.      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 Vitals are unable to obtained due to COVID-19 public health emergency  Activities of Daily Living In your present state of health, do you have any difficulty performing the following activities: 12/07/2020 05/03/2020  Hearing? N N  Vision? N N  Comment "Slight case of glaucoma" Bilat -  Difficulty concentrating or making decisions? N N  Walking or climbing stairs? Y N  Comment "I don't do stairs" -  Dressing or bathing? N N  Doing errands, shopping? N N  Some recent data might be hidden    Goals Goals    . Exercise 2x per week (15 min per time)     Push ups, stretches, seated and standing exercises with exercise band.       Fall Risk Fall Risk  12/07/2020 05/03/2020 07/20/2019 06/18/2019 04/06/2019  Falls in the past year? 1 0 0 0 0  Comment - - - - -  Number falls in past yr: 0 0 - - -  Injury with Fall? 1 0 - - -  Comment right elbow - - - -  Risk for fall due to : History of fall(s);Impaired balance/gait - - - -  Risk for fall due to: Comment legs feel weak from back pain - - - -  Follow up Education provided;Falls prevention discussed - - Falls prevention  discussed -   CDC Handout on Fall Prevention and Handout on Home Exercise Program, Access codes [id number] and [id number] mailed to patient with exercise band.   Depression Screen PHQ 2/9 Scores 12/07/2020 05/03/2020 07/20/2019 04/06/2019  PHQ - 2 Score 1 0 2 2  PHQ- 9 Score 7 0 10 11  Exception Documentation - - - -     Cognitive Testing Six-Item Cognitive Screener   "I would like to ask you some questions that ask you to use your memory. I am going to name three objects. Please wait until I say all three words, then repeat them. Remember what they are  because I am going to ask you to name them again in a few minutes. Please repeat these words for me: APPLE--TABLE--PENNY." (Interviewer may repeat names 3 times if necessary but repetition not scored.)  Did patient correctly repeat all three  words? Yes - may proceed with screen  What year is this? Correct What month is this? Correct What day of the week is this? Correct  What were the three objects I asked you to remember? . Apple Correct . Table Correct . Javier Beasley Unable to state  Score one point for each incorrect answer.  A score of 2 or more points warrants additional investigation.  Patient's score 1   Assessment and Plan:     Patient c/o intermittent weakness, feeling hot then cold. Thinks it is r/t blood sugars. Discussed meeting with Diabetic Educator and patient is very interested. Referral placed. Patient has f/u with [facility] Team on 12/12/2020 He has received his Covid Booster but declines the flu and pneumonia vaccines He had a colonoscopy 06/10/2018 with 10 year return. Still coming up as overdue on Health Maintenance. Patient has been out of Symicort for 6 months and has needed to use albuterol more frequently. Explained importance of maintenance inhaler. Patient has been out of Crestor x 3-4 months and lisinopril x 3 days. Explained importance of requesting refills before running out to prevent missed doses. Will provide pill box and carrying bag at upcoming appt on 12/12/2020 He will begin seated and standing exercises with exercise band to increase strength and balance.   During the course of the visit the patient was educated and counseled about appropriate screening and preventive services as documented in the assessment and plan.  The printed AVS was given to the patient and included an updated screening schedule, a list of risk factors, and personalized health advice.        Javier Severance, RN  12/07/2020

## 2020-12-07 NOTE — Patient Instructions (Addendum)
Things That May Be Affecting Your Health:  Alcohol  Hearing loss  Pain    Depression  Home Safety  Sexual Health  x Diabetes  Lack of physical activity  Stress   Difficulty with daily activities  Loneliness  Tiredness   Drug use x Medicines  Tobacco use   Falls  Motor Vehicle Safety x Weight   Food choices  Oral Health  Other    YOUR PERSONALIZED HEALTH PLAN : 1. Schedule your next subsequent Medicare Wellness visit in one year 2. Attend all of your regular appointments to address your medical issues 3. Complete the preventative screenings and services 4. A referral has been placed to our Diabetic Educator, Lupita Leash Plyler. 5. Refill requests have been sent for Albuterol, Symicort lisinopril, and rosuvastatin 6. We will provide you a pill box and carrying bag at your next visit on 12/12/2020 7. Please begin seated and standing exercises with exercise band to increase strength and balance.  Annual Wellness Visit                       Medicare Covered Preventative Screenings and Services  Services & Screenings Men and Women Who How Often Need? Date of Last Service Action  Abdominal Aortic Aneurysm Adults with AAA risk factors Once      Alcohol Misuse and Counseling All Adults Screening once a year if no alcohol misuse. Counseling up to 4 face to face sessions.     Bone Density Measurement  Adults at risk for osteoporosis Once every 2 yrs      Lipid Panel Z13.6 All adults without CV disease Once every 5 yrs       Colorectal Cancer   Stool sample or  Colonoscopy All adults 50 and older   Once every year  Every 10 years        Depression All Adults Once a year  Today   Diabetes Screening Blood glucose, post glucose load, or GTT Z13.1  All adults at risk  Pre-diabetics  Once per year  Twice per year      Diabetes  Self-Management Training All adults Diabetics 10 hrs first year; 2 hours subsequent years. Requires Copay      Glaucoma  Diabetics  Family history of glaucoma  African Americans 50 yrs +  Hispanic Americans 65 yrs + Annually - requires coppay      Hepatitis C Z72.89 or F19.20  High Risk for HCV  Born between 1945 and 1965  Annually  Once      HIV Z11.4 All adults based on risk  Annually btw ages 85 & 68 regardless of risk  Annually > 65 yrs if at increased risk      Lung Cancer Screening Asymptomatic adults aged 63-77 with 30 pack yr history and current smoker OR quit within the last 15 yrs Annually Must have counseling and shared decision making documentation before first screen      Medical Nutrition Therapy Adults with   Diabetes  Renal disease  Kidney transplant within past 3 yrs 3 hours first year; 2 hours subsequent years     Obesity and Counseling All adults Screening once a year Counseling if BMI 30 or higher  Today   Tobacco Use Counseling Adults who use tobacco  Up to 8 visits in one year     Vaccines Z23  Hepatitis B  Influenza   Pneumonia  Adults   Once  Once every flu season  Two different vaccines separated by  one year     Next Annual Wellness Visit People with Medicare Every year  Today     Services & Screenings Women Who How Often Need  Date of Last Service Action  Mammogram  Z12.31 Women over 40 One baseline ages 75-39. Annually ager 40 yrs+      Pap tests All women Annually if high risk. Every 2 yrs for normal risk women      Screening for cervical cancer with   Pap (Z01.419 nl or Z01.411abnl) &  HPV Z11.51 Women aged 63 to 35 Once every 5 yrs     Screening pelvic and breast exams All women Annually if high risk. Every 2 yrs for normal risk women     Sexually Transmitted Diseases  Chlamydia  Gonorrhea  Syphilis All at risk adults Annually for non pregnant females at increased risk         Services & Screenings Men Who How Ofter Need  Date of Last Service Action  Prostate  Cancer - DRE & PSA Men over 50 Annually.  DRE might require a copay.        Sexually Transmitted Diseases  Syphilis All at risk adults Annually for men at increased risk        Fall Prevention in the Home, Adult Falls can cause injuries and can happen to people of all ages. There are many things you can do to make your home safe and to help prevent falls. Ask for help when making these changes. What actions can I take to prevent falls? General Instructions  Use good lighting in all rooms. Replace any light bulbs that burn out.  Turn on the lights in dark areas. Use night-lights.  Keep items that you use often in easy-to-reach places. Lower the shelves around your home if needed.  Set up your furniture so you have a clear path. Avoid moving your furniture around.  Do not have throw rugs or other things on the floor that can make you trip.  Avoid walking on wet floors.  If any of your floors are uneven, fix them.  Add color or contrast paint or tape to clearly mark and help you see: ? Grab bars or handrails. ? First and last steps of staircases. ? Where the edge of each step is.  If you use a stepladder: ? Make sure that it is fully opened. Do not climb a closed stepladder. ? Make sure the sides of the stepladder are locked in place. ? Ask someone to hold the stepladder while you use it.  Know where your pets are when moving through your home. What can I do in the bathroom?  Keep the floor dry. Clean up any water on the floor right away.  Remove soap buildup in the tub or shower.  Use nonskid mats or decals on the floor of the tub or shower.  Attach bath mats securely with double-sided, nonslip rug tape.  If you need to sit down in the shower, use a plastic, nonslip stool.  Install grab bars by the toilet and in the tub and shower. Do not use towel bars as grab bars.      What can I do in the bedroom?  Make sure that you have a light by your bed that  is easy to reach.  Do not use any sheets or blankets for your bed that hang to the floor.  Have a firm chair with side arms that you can use for support when you get  dressed. What can I do in the kitchen?  Clean up any spills right away.  If you need to reach something above you, use a step stool with a grab bar.  Keep electrical cords out of the way.  Do not use floor polish or wax that makes floors slippery. What can I do with my stairs?  Do not leave any items on the stairs.  Make sure that you have a light switch at the top and the bottom of the stairs.  Make sure that there are handrails on both sides of the stairs. Fix handrails that are broken or loose.  Install nonslip stair treads on all your stairs.  Avoid having throw rugs at the top or bottom of the stairs.  Choose a carpet that does not hide the edge of the steps on the stairs.  Check carpeting to make sure that it is firmly attached to the stairs. Fix carpet that is loose or worn. What can I do on the outside of my home?  Use bright outdoor lighting.  Fix the edges of walkways and driveways and fix any cracks.  Remove anything that might make you trip as you walk through a door, such as a raised step or threshold.  Trim any bushes or trees on paths to your home.  Check to see if handrails are loose or broken and that both sides of all steps have handrails.  Install guardrails along the edges of any raised decks and porches.  Clear paths of anything that can make you trip, such as tools or rocks.  Have leaves, snow, or ice cleared regularly.  Use sand or salt on paths during winter.  Clean up any spills in your garage right away. This includes grease or oil spills. What other actions can I take?  Wear shoes that: ? Have a low heel. Do not wear high heels. ? Have rubber bottoms. ? Feel good on your feet and fit well. ? Are closed at the toe. Do not wear open-toe sandals.  Use tools that help you  move around if needed. These include: ? Canes. ? Walkers. ? Scooters. ? Crutches.  Review your medicines with your doctor. Some medicines can make you feel dizzy. This can increase your chance of falling. Ask your doctor what else you can do to help prevent falls. Where to find more information  Centers for Disease Control and Prevention, [employer]AD[employer]: FootballExhibition.com.br  [employer] on Aging: https://walker.com/ Contact a doctor if:  You are afraid of falling at home.  You feel weak, drowsy, or dizzy at home.  You fall at home. Summary  There are many simple things that you can do to make your home safe and to help prevent falls.  Ways to make your home safe include removing things that can make you trip and installing grab bars in the bathroom.  Ask for help when making these changes in your home. This information is not intended to replace advice given to you by your health care provider. Make sure you discuss any questions you have with your health care provider. Document Revised: 04/06/2020 Document Reviewed: 04/06/2020 Elsevier Patient Education  2021 Elsevier Inc.   Health Maintenance, Male Adopting a healthy lifestyle and getting preventive care are important in promoting health and wellness. Ask your health care provider about:  The right schedule for you to have regular tests and exams.  Things you can do on your own to prevent diseases and keep yourself healthy. What should I know  about diet, weight, and exercise? Eat a healthy diet  Eat a diet that includes plenty of vegetables, fruits, low-fat dairy products, and lean protein.  Do not eat a lot of foods that are high in solid fats, added sugars, or sodium.   Maintain a healthy weight Body mass index (BMI) is a measurement that can be used to identify possible weight problems. It estimates body fat based on height and weight. Your health care provider can help determine your BMI and help you achieve or maintain a  healthy weight. Get regular exercise Get regular exercise. This is one of the most important things you can do for your health. Most adults should:  Exercise for at least 150 minutes each week. The exercise should increase your heart rate and make you sweat (moderate-intensity exercise).  Do strengthening exercises at least twice a week. This is in addition to the moderate-intensity exercise.  Spend less time sitting. Even light physical activity can be beneficial. Watch cholesterol and blood lipids Have your blood tested for lipids and cholesterol at 55 years of age, then have this test every 5 years. You may need to have your cholesterol levels checked more often if:  Your lipid or cholesterol levels are high.  You are older than 55 years of age.  You are at high risk for heart disease. What should I know about cancer screening? Many types of cancers can be detected early and may often be prevented. Depending on your health history and family history, you may need to have cancer screening at various ages. This may include screening for:  Colorectal cancer.  Prostate cancer.  Skin cancer.  Lung cancer. What should I know about heart disease, diabetes, and high blood pressure? Blood pressure and heart disease  High blood pressure causes heart disease and increases the risk of stroke. This is more likely to develop in people who have high blood pressure readings, are of African descent, or are overweight.  Talk with your health care provider about your target blood pressure readings.  Have your blood pressure checked: ? Every 3-5 years if you are 58-20 years of age. ? Every year if you are 60 years old or older.  If you are between the ages of 55 and 51 and are a current or former smoker, ask your health care provider if you should have a one-time screening for abdominal aortic aneurysm (AAA). Diabetes Have regular diabetes screenings. This checks your fasting blood sugar  level. Have the screening done:  Once every three years after age 59 if you are at a normal weight and have a low risk for diabetes.  More often and at a younger age if you are overweight or have a high risk for diabetes. What should I know about preventing infection? Hepatitis B If you have a higher risk for hepatitis B, you should be screened for this virus. Talk with your health care provider to find out if you are at risk for hepatitis B infection. Hepatitis C Blood testing is recommended for:  Everyone born from [birth year] through 1965.  Anyone with known risk factors for hepatitis C. Sexually transmitted infections (STIs)  You should be screened each year for STIs, including gonorrhea and chlamydia, if: ? You are sexually active and are younger than 55 years of age. ? You are older than 55 years of age and your health care provider tells you that you are at risk for this type of infection. ? Your sexual activity has changed since  you were last screened, and you are at increased risk for chlamydia or gonorrhea. Ask your health care provider if you are at risk.  Ask your health care provider about whether you are at high risk for HIV. Your health care provider may recommend a prescription medicine to help prevent HIV infection. If you choose to take medicine to prevent HIV, you should first get tested for HIV. You should then be tested every 3 months for as long as you are taking the medicine. Follow these instructions at home: Lifestyle  Do not use any products that contain nicotine or tobacco, such as cigarettes, e-cigarettes, and chewing tobacco. If you need help quitting, ask your health care provider.  Do not use street drugs.  Do not share needles.  Ask your health care provider for help if you need support or information about quitting drugs. Alcohol use  Do not drink alcohol if your health care provider tells you not to drink.  If you drink alcohol: ? Limit how much you  have to 0-2 drinks a day. ? Be aware of how much alcohol is in your drink. In the [place] one drink equals one 12 oz bottle of beer (355 mL), one 5 oz glass of wine (148 mL), or one 1 oz glass of hard liquor (44 mL). General instructions  Schedule regular health, dental, and eye exams.  Stay current with your vaccines.  Tell your health care provider if: ? You often feel depressed. ? You have ever been abused or do not feel safe at home. Summary  Adopting a healthy lifestyle and getting preventive care are important in promoting health and wellness.  Follow your health care provider's instructions about healthy diet, exercising, and getting tested or screened for diseases.  Follow your health care provider's instructions on monitoring your cholesterol and blood pressure. This information is not intended to replace advice given to you by your health care provider. Make sure you discuss any questions you have with your health care provider. Document Revised: 08/27/2018 Document Reviewed: 08/27/2018 Elsevier Patient Education  2021 [employer]

## 2020-12-07 NOTE — Progress Notes (Signed)
I discussed the AWV findings with the RN who conducted the visit. I was present in the office suite and immediately available to provide assistance and direction throughout the time the service was provided.   

## 2020-12-08 ENCOUNTER — Other Ambulatory Visit: Payer: Self-pay | Admitting: Internal Medicine

## 2020-12-08 MED ORDER — ROSUVASTATIN CALCIUM 40 MG PO TABS: 20.0000 mg | ORAL_TABLET | Freq: Every day | ORAL | 3 refills | Status: DC

## 2020-12-08 MED ORDER — BUDESONIDE-FORMOTEROL FUMARATE 160-4.5 MCG/ACT IN AERO: 2.0000 | INHALATION_SPRAY | Freq: Two times a day (BID) | RESPIRATORY_TRACT | 2 refills | Status: DC

## 2020-12-08 MED ORDER — LISINOPRIL 40 MG PO TABS: 40.0000 mg | ORAL_TABLET | Freq: Every day | ORAL | 1 refills | Status: DC

## 2020-12-08 MED ORDER — ALBUTEROL SULFATE HFA 108 (90 BASE) MCG/ACT IN AERS: 2.0000 | INHALATION_SPRAY | Freq: Four times a day (QID) | RESPIRATORY_TRACT | 3 refills | Status: DC | PRN

## 2020-12-08 MED FILL — ROSUVASTATIN CALCIUM 40 MG: 40 | 90 days supply | Qty: 45 | Fill #0

## 2020-12-08 MED FILL — ALBUTEROL SULFATE HFA 108 (: 108 (90 BAS | 75 days supply | Qty: 54 | Fill #0

## 2020-12-08 MED FILL — LISINOPRIL 40 MG TABS: 40 | 90 days supply | Qty: 90 | Fill #0

## 2020-12-08 MED FILL — SYMBICORT 160-4.5 MCG INH: 160-4.5 | 30 days supply | Qty: 10 | Fill #0

## 2020-12-11 NOTE — Progress Notes (Signed)
   CC: follow-up for HTN, DM, back pain, STI screening  HPI:  [name] is a 55 y.o. man with history as below who presents to clinic for the above chief complaints. His last clinic visit was a virtual AWV on 12/07/20. Prior to that, he was seen in-person for evaluation at [facility] on 05/03/20.   To see the details of this patient's management of their acute and chronic problems, please refer to the Assessment & Plan under the Encounters tab.    Past Medical History:  Diagnosis Date  . Allergy   . Asthma   . Bilateral chronic knee pain   . Chronic back pain   . Chronic shoulder pain   . Depression   . Diabetes mellitus without complication (HCC)    diagnosed in 2015  . GERD (gastroesophageal reflux disease)   . History of herniated intervertebral disc   . Hx of cardiovascular stress test    a. Lexiscan Myoview 10/29/12:  EF 56%, no ischemia  . Hx of echocardiogram    a. Echo 10/24/12:  EF 60-65%, normal wall thickness, normal diastolic fxn  . Hyperlipidemia   . Hypertension   . Reflux   . Renal disorder    chronic kidney disease   Review of Systems:    Review of Systems  Constitutional: Negative for chills and fever.  HENT: Negative for congestion.   Eyes: Negative for blurred vision.  Respiratory: Negative for shortness of breath.   Genitourinary: Negative for dysuria, frequency and urgency.  Musculoskeletal: Positive for back pain. Negative for myalgias.  Neurological: Negative for dizziness and focal weakness.  Psychiatric/Behavioral: Negative for depression. The patient is not nervous/anxious.     Physical Exam:  Vitals:   12/12/20 1031  BP: (!) 155/96  Pulse: 79  Temp: 98.4 F (36.9 C)  TempSrc: Oral  SpO2: 100%  Weight: 198 lb 1.6 oz (89.9 kg)  Height: 6\' 1"  (1.854 m)   Constitutional: very pleasant, well-appearing man sitting in chair, in no acute distress HENT: normocephalic atraumatic, mucous membranes moist Eyes: conjunctiva non-erythematous Neck:  supple Cardiovascular: regular rate and rhythm, no m/r/g Pulmonary/Chest: normal work of breathing on room air, lungs clear to auscultation bilaterally Abdominal: soft, non-distended MSK: normal bulk and tone Neurological: alert & oriented x 3, normal gait; slight tenderness to palpation of lumbar paraspinal muscles, no midline tenderness Skin: warm and dry Psych: normal mood and affect    Assessment & Plan:   See Encounters Tab for problem-based charting.  Patient discussed with Dr. 

## 2020-12-11 NOTE — Patient Instructions (Addendum)
Javier Beasley,   Thank you for your visit to the [facility] today. It was a pleasure meeting you. Congratulations on your grandbaby! Today we discussed the following:  1) Diabetes - Your hemoglobin A1c was 7.3% today, increased from 7.1% at last check in August - It's definitely a great idea to work with Lupita Leash! - I would like you to INCREASE your metformin to 500 mg twice daily  2) Hypertension - Your blood pressure is still not quite at goal of <130/80.  - In addition to continuing lisinopril 40 mg daily, I would like you to START chlorthalidone 12.5 mg daily - We are checking your electrolytes and kidney function today. I will call you with the result. - Recommend you check your BP once daily with your home cuff. Record these values, and bring the record and your cuff to your next visit.   HOW TO TAKE YOUR BLOOD PRESSURE:  Rest 5 minutes before taking your blood pressure.  Don't smoke or drink caffeinated beverages for at least 30 minutes before.  Take your blood pressure before (not after) you eat.  Sit comfortably with your back supported and both feet on the floor (don't cross your legs).  Elevate your arm to heart level on a table or a desk.  Use the proper sized cuff. It should fit smoothly and snugly around your bare upper arm. There should be enough room to slip a fingertip under the cuff. The bottom edge of the cuff should be 1 inch above the crease of the elbow.  Ideally, take 3 measurements at one sitting and record the average.   3) Back pain - continue Tylenol, massage, heat, and stretching - I have placed a referral to physical medicine and rehabilitation (PM&R) to see what additional treatments may be indicated for you. Someone should call you to schedule this.  4) STI testing: I will call you with the results!   We would like to see you back in 24-month for a blood pressure check. Please bring all of your medications with you.   If you  have any questions or concerns, please call our clinic at [phone] between 9am-5pm. Outside of these hours, call [phone] and ask for the internal medicine resident on call. If you feel you are having a medical emergency please call 911.

## 2020-12-12 ENCOUNTER — Other Ambulatory Visit: Payer: Self-pay

## 2020-12-12 ENCOUNTER — Other Ambulatory Visit (HOSPITAL_COMMUNITY)
Admission: RE | Admit: 2020-12-12 | Discharge: 2020-12-12 | Disposition: A | Discharge status: Home / Self Care | Payer: Medicare HMO | Source: Ambulatory Visit | Attending: Internal Medicine | Admitting: Internal Medicine

## 2020-12-12 ENCOUNTER — Other Ambulatory Visit: Payer: Self-pay | Admitting: Student

## 2020-12-12 ENCOUNTER — Encounter: Payer: Self-pay | Admitting: Student

## 2020-12-12 ENCOUNTER — Ambulatory Visit (INDEPENDENT_AMBULATORY_CARE_PROVIDER_SITE_OTHER): Payer: Medicare HMO | Admitting: Student

## 2020-12-12 VITALS — BP 137/93 | HR 73 | Temp 98.4°F | Ht 73.0 in | Wt 198.1 lb

## 2020-12-12 DIAGNOSIS — G8929 Other chronic pain: Secondary | ICD-10-CM | POA: Diagnosis not present

## 2020-12-12 DIAGNOSIS — M545 Low back pain, unspecified: Secondary | ICD-10-CM | POA: Diagnosis not present

## 2020-12-12 DIAGNOSIS — E1122 Type 2 diabetes mellitus with diabetic chronic kidney disease: Secondary | ICD-10-CM

## 2020-12-12 DIAGNOSIS — I10 Essential (primary) hypertension: Secondary | ICD-10-CM | POA: Diagnosis not present

## 2020-12-12 DIAGNOSIS — Z113 Encounter for screening for infections with a predominantly sexual mode of transmission: Secondary | ICD-10-CM | POA: Diagnosis not present

## 2020-12-12 DIAGNOSIS — I129 Hypertensive chronic kidney disease with stage 1 through stage 4 chronic kidney disease, or unspecified chronic kidney disease: Secondary | ICD-10-CM

## 2020-12-12 DIAGNOSIS — N182 Chronic kidney disease, stage 2 (mild): Secondary | ICD-10-CM

## 2020-12-12 DIAGNOSIS — E119 Type 2 diabetes mellitus without complications: Secondary | ICD-10-CM | POA: Diagnosis not present

## 2020-12-12 LAB — POCT GLYCOSYLATED HEMOGLOBIN (HGB A1C): Hemoglobin A1C: 7.3 % — AB (ref 4.0–5.6)

## 2020-12-12 LAB — GLUCOSE, CAPILLARY: Glucose-Capillary: 132 mg/dL — ABNORMAL HIGH (ref 70–99)

## 2020-12-12 MED ORDER — CHLORTHALIDONE 25 MG PO TABS: 12.5000 mg | ORAL_TABLET | Freq: Every day | ORAL | 1 refills | Status: DC

## 2020-12-12 MED ORDER — METFORMIN HCL ER 500 MG PO TB24: 500.0000 mg | ORAL_TABLET | Freq: Two times a day (BID) | ORAL | 3 refills | Status: DC

## 2020-12-12 MED FILL — METFORMIN HCL ER 500 MG TB2: 500 | 90 days supply | Qty: 180 | Fill #0

## 2020-12-12 MED FILL — CHLORTHALIDONE 25 MG TABS: 25 | 60 days supply | Qty: 30 | Fill #0

## 2020-12-12 NOTE — Assessment & Plan Note (Signed)
Javier Beasley reports ongoing chronic low back pain, unchanged from prior. States the pain is mostly with walking long distances or sitting for a long time. The pain is bilateral and sometimes radiates down the backs of his legs. Denies recent fevers or chills, numbness, tingling, weakness, urinary or fecal incontinence. He states he has been taking Tylenol and ibuprofen as needed. He initially requests refill of flexeril but as I was counseling him regarding side effect of drowsiness, he recalled the side effect and decided against refill. He states he previously did physical therapy and was told his problem is surgical, but was given stretches and exercises for home which he states he does. He also gets massages periodically, most recently yesterday. Reports having had steroid injections in the past but not in a few years.  On exam he has no midline spinal tenderness but has mild tenderness to palpation of lumbar paraspinal muscles. Had a lumbar MRI 12/2019 which showed Lumbar spine spondylosis most notable at L4-L5 with a small central disc protrusion, annular fissure and moderate central canal stenosis.  Plan: - continue Tylenol and NSAIDs as needed - massage as able - referral to PM&R

## 2020-12-12 NOTE — Assessment & Plan Note (Addendum)
Javier Beasley requests STI screening today. He states he has been in a new relationship for the last 6 months with a single male partner and has not had STI testing in some time. He denies symptoms of penile discharge, dysuria, etc.   Plan: - HIV, RPR blood tests - urine cytology for gonorrhea and chlamydia  ADDENDUM (12/13/20): the above tests were all NEGATIVE. Communicated result to patient via MyChart.

## 2020-12-12 NOTE — Assessment & Plan Note (Addendum)
BP today 137/93 after repeat. Patient reports his lisinopril 40 mg daily was refilled a few days ago, prior to which he had been out of his prescription for a few days. Reports occasional lightheadedness since getting a new glasses prescription and starting drops for glaucoma. Has a BP cuff at home but does not check his BP regularly.   Assessment/Plan: BP not at goal of <130/80. Patient admits to history of intermittent adherence to lisinopril 40 mg daily but states recently he has been consistently taking it. Getting pill box organizer today to help with this. Given his history of CKD stage II, will start chlorthalidone 12.5 mg daily. - continue lisinopril 40 mg daily - START chlorthalidone 12.5 mg daily - BMP today --> Na 141, K 4.6, BUN/Cr 11/1.24. Communicated result to patient via MyChart. - RTC in 1 month for BP check - Counseled patient on monitoring BP at home. - Pill organizer given today

## 2020-12-12 NOTE — Assessment & Plan Note (Signed)
Hemoglobin A1c 7.3% today from 7.1% at last check 7 months ago (05/04/20). Patient reports adherence to metformin 500 mg daily without side effects. During recent virtual AWV referral to diabetes educator, Lupita Leash, was placed, and he states he is waiting for call to schedule an appointment with her.  Plan: - INCREASE metformin to 500 mg twice daily - Referral to diabetes educator, Lupita Leash, placed 12/07/20 - Repeat A1c on or after 03/14/21

## 2020-12-13 LAB — RPR: RPR Ser Ql: NONREACTIVE

## 2020-12-13 LAB — BMP8+ANION GAP
Anion Gap: 16 mmol/L (ref 10.0–18.0)
BUN/Creatinine Ratio: 9 (ref 9–20)
BUN: 11 mg/dL (ref 6–24)
CO2: 25 mmol/L (ref 20–29)
Calcium: 9.9 mg/dL (ref 8.7–10.2)
Chloride: 100 mmol/L (ref 96–106)
Creatinine, Ser: 1.24 mg/dL (ref 0.76–1.27)
Glucose: 99 mg/dL (ref 65–99)
Potassium: 4.6 mmol/L (ref 3.5–5.2)
Sodium: 141 mmol/L (ref 134–144)
eGFR: 69 mL/min/{1.73_m2} (ref >59)

## 2020-12-13 LAB — URINE CYTOLOGY ANCILLARY ONLY
Chlamydia: NEGATIVE
Comment: NEGATIVE
Comment: NORMAL
Neisseria Gonorrhea: NEGATIVE

## 2020-12-13 LAB — HIV ANTIBODY (ROUTINE TESTING W REFLEX): HIV Screen 4th Generation wRfx: NONREACTIVE

## 2020-12-14 NOTE — Progress Notes (Signed)
Internal Medicine Clinic Attending  Case discussed with Dr. Watson  At the time of the visit.  We reviewed the resident's history and exam and pertinent patient test results.  I agree with the assessment, diagnosis, and plan of care documented in the resident's note.  

## 2020-12-20 ENCOUNTER — Encounter: Payer: Self-pay | Admitting: Physical Medicine and Rehabilitation

## 2021-01-12 ENCOUNTER — Encounter: Payer: Medicare HMO | Admitting: Dietician

## 2021-01-12 ENCOUNTER — Encounter: Payer: Medicare HMO | Admitting: Internal Medicine

## 2021-01-26 ENCOUNTER — Encounter: Payer: Medicare HMO | Admitting: Internal Medicine

## 2021-01-26 ENCOUNTER — Other Ambulatory Visit (HOSPITAL_COMMUNITY): Payer: Self-pay

## 2021-01-26 ENCOUNTER — Encounter: Payer: Self-pay | Admitting: Physical Medicine and Rehabilitation

## 2021-01-26 ENCOUNTER — Other Ambulatory Visit: Payer: Self-pay

## 2021-01-26 ENCOUNTER — Encounter: Payer: Medicare HMO | Attending: Physical Medicine and Rehabilitation | Admitting: Physical Medicine and Rehabilitation

## 2021-01-26 VITALS — Ht 73.0 in | Wt 197.0 lb

## 2021-01-26 DIAGNOSIS — Z5181 Encounter for therapeutic drug level monitoring: Secondary | ICD-10-CM | POA: Insufficient documentation

## 2021-01-26 DIAGNOSIS — G894 Chronic pain syndrome: Secondary | ICD-10-CM | POA: Insufficient documentation

## 2021-01-26 DIAGNOSIS — Z79891 Long term (current) use of opiate analgesic: Secondary | ICD-10-CM | POA: Insufficient documentation

## 2021-01-26 MED ORDER — LIDOCAINE 5 % EX PTCH
1.0000 | MEDICATED_PATCH | CUTANEOUS | 1 refills | Status: AC
  Filled 2021-01-26: qty 90, 90d supply, fill #0
  Filled 2021-04-12: qty 30, 30d supply, fill #0
  Filled 2021-06-19 – 2022-01-19 (×3): qty 90, 90d supply, fill #0

## 2021-01-26 NOTE — Progress Notes (Signed)
Subjective:    Patient ID: Javier Beasley, male    DOB: December 12, 1965, 55 y.o.   MRN: [id number]  HPI  Mr. Grippi is 55 year old man who presents with lower back pain.  1) disc herniations -he has 5 -was an athlete. -has had MRIs -gradually got worse over time. -he has had 3 cortisone shots, heat, physical therapy, massage -he thinks he would benefit from more intense therapy than what he had.  -he cannot afford working with a trainer.  -he does not want to have surgery -average pain is 8/10 -pain right now is 6/10 -pain radiates down both legs.   2) right knee pain -hurts with ambulation -applies massage cream and ace bandage   Pain Inventory Average Pain 8 Pain Right Now 6 My pain is constant, stabbing, tingling and aching  In the last 24 hours, has pain interfered with the following? General activity 4 Relation with others 4 Enjoyment of life 4 What TIME of day is your pain at its worst? night Sleep (in general) Poor  Pain is worse with: walking, bending, sitting, inactivity, standing and some activites Pain improves with: therapy/exercise Relief from Meds: 5  Family History  Problem Relation Age of Onset  . Hypertension Other   . Diabetes Other   . Heart attack Other   . Diabetes Mother   . Hypertension Mother   . Bladder Cancer Mother   . Hypertension Sister   . Stroke Sister   . Diabetes Sister   . Hypertension Brother   . Asthma Daughter   . Hypertension Sister   . Diabetes Sister   . Hyperlipidemia Neg Hx   . Sudden death Neg Hx    Social History   Socioeconomic History  . Marital status: Divorced    Spouse name: Not on file  . Number of children: 5  . Years of education: 45  . Highest education level: Not on file  Occupational History  . Occupation: Disabled  Tobacco Use  . Smoking status: Never Smoker  . Smokeless tobacco: Never Used  Substance and Sexual Activity  . Alcohol use: Yes    Alcohol/week: 3.0 standard drinks    Types: 3  Cans of beer per week    Comment: Beer.  . Drug use: Yes    Types: Marijuana    Comment: sometimes.  . Sexual activity: Yes    Birth control/protection: Condom  Other Topics Concern  . Not on file  Social History Narrative   Current Social History 12/07/2020        Patient lives alone in a ground floor apartment.. There are not steps up to the entrance the patient uses.       Patient's method of transportation is personal car.      The highest level of education was high school diploma.      The patient currently disabled.      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 Ducatte, BSN, RN-BC        Social Determinants of Health   Financial Resource Strain: Not on file  Food Insecurity: Not on file  Transportation Needs: Not on file  Physical Activity: Not on file  Stress: Not on file  Social Connections: Not on file   Past Surgical History:  Procedure Laterality Date  . BALLOON DILATION N/A 10/24/2012   Procedure: BALLOON DILATION;  Surgeon: Willis Modena, MD;  Location: [facility] ENDOSCOPY;  Service: Endoscopy;  Laterality: N/A;  . COLONOSCOPY WITH PROPOFOL N/A 05/21/2018   Procedure: COLONOSCOPY WITH PROPOFOL;  Surgeon: Willis Modena, MD;  Location: [facility] ENDOSCOPY;  Service: Endoscopy;  Laterality: N/A;  . ESOPHAGOGASTRODUODENOSCOPY Left 10/24/2012   Procedure: ESOPHAGOGASTRODUODENOSCOPY (EGD);  Surgeon: Willis Modena, MD;  Location: [facility] ENDOSCOPY;  Service: Endoscopy;  Laterality: Left;  possible balloon dilation  . KNEE ARTHROSCOPY WITH EXCISION PLICA  05/11/2015   Procedure: KNEE ARTHROSCOPY WITH EXCISION PLICA;  Surgeon: Tarry Kos, MD;  Location: [facility];  Service: Orthopedics;;  . KNEE ARTHROSCOPY WITH  LATERAL MENISECTOMY  05/11/2015   Procedure: KNEE ARTHROSCOPY WITH LATERAL MENISECTOMY;  Surgeon: Tarry Kos, MD;  Location: [facility];  Service: Orthopedics;;  . SYNOVECTOMY Right 05/11/2015   Procedure: SYNOVECTOMY;  Surgeon: Tarry Kos, MD;  Location: [facility];  Service: Orthopedics;  Laterality: Right;   Past Surgical History:  Procedure Laterality Date  . BALLOON DILATION N/A 10/24/2012   Procedure: BALLOON DILATION;  Surgeon: Willis Modena, MD;  Location: [facility] ENDOSCOPY;  Service: Endoscopy;  Laterality: N/A;  . COLONOSCOPY WITH PROPOFOL N/A 05/21/2018   Procedure: COLONOSCOPY WITH PROPOFOL;  Surgeon: Willis Modena, MD;  Location: [facility];  Service: Endoscopy;  Laterality: N/A;  . ESOPHAGOGASTRODUODENOSCOPY Left 10/24/2012   Procedure: ESOPHAGOGASTRODUODENOSCOPY (EGD);  Surgeon: Willis Modena, MD;  Location: [facility] ENDOSCOPY;  Service: Endoscopy;  Laterality: Left;  possible balloon dilation  . KNEE ARTHROSCOPY WITH EXCISION PLICA  05/11/2015   Procedure: KNEE ARTHROSCOPY WITH EXCISION PLICA;  Surgeon: Tarry Kos, MD;  Location: [facility];  Service: Orthopedics;;  . KNEE ARTHROSCOPY WITH LATERAL MENISECTOMY  05/11/2015   Procedure: KNEE ARTHROSCOPY WITH LATERAL MENISECTOMY;  Surgeon: Tarry Kos, MD;  Location: [facility];  Service: Orthopedics;;  . SYNOVECTOMY Right 05/11/2015   Procedure: SYNOVECTOMY;  Surgeon: Tarry Kos, MD;  Location: [facility];  Service: Orthopedics;  Laterality: Right;   Past Medical History:  Diagnosis Date  . Allergy   . Asthma   . Bilateral chronic knee pain   . Chronic back pain   . Chronic shoulder pain   . Depression   . Diabetes mellitus without complication (HCC)    diagnosed in 2015  . GERD (gastroesophageal reflux disease)   . History of herniated intervertebral disc   . Hx of cardiovascular stress test    a. Lexiscan Myoview 10/29/12:  EF 56%, no ischemia  . Hx of  echocardiogram    a. Echo 10/24/12:  EF 60-65%, normal wall thickness, normal diastolic fxn  . Hyperlipidemia   . Hypertension   . Reflux   . Renal disorder    chronic kidney disease   Ht 6\' 1"  (1.854 m)   Wt 197 lb (89.4 kg)   BMI 25.99 kg/m   Opioid Risk Score:   Fall Risk Score:  `1  Depression screen PHQ 2/9  Depression screen [facility] 2/9 01/26/2021 12/12/2020 12/07/2020 05/03/2020 07/20/2019 04/06/2019 02/17/2019  Decreased Interest 0 0 0 0 1 1 2   Down, Depressed, Hopeless 0 0 1 0 1 1 2   PHQ - 2 Score 0 0 1 0 2 2 4  Altered sleeping 1 1 3  0 3 3 3   Tired, decreased energy 1 1 1  0 3 3 0  Change in appetite 1 1 1  0 0 1 0  Feeling bad or failure about yourself  0 0 0 0 0 0 0  Trouble concentrating 0 0 0 0 1 1 0  Moving slowly or fidgety/restless 0 0 1 0 1 1 3   Suicidal thoughts 0 0 0 0 0 0 0  PHQ-9 Score 3 3 7  0 10 11 10   Difficult doing work/chores Not difficult at all Not difficult at all Somewhat difficult Not difficult at all Somewhat difficult Somewhat difficult Not difficult at all  Some recent data might be hidden    Review of Systems  Constitutional: Positive for appetite change and unexpected weight change.  HENT: Negative.   Eyes: Negative.   Respiratory: Positive for shortness of breath and wheezing.   Cardiovascular: Negative.   Gastrointestinal: Positive for abdominal pain.  Endocrine: Negative.   Genitourinary: Positive for difficulty urinating.  Musculoskeletal: Positive for arthralgias, back pain, gait problem, neck pain and neck stiffness.  Skin: Negative.   Allergic/Immunologic: Negative.   Neurological: Positive for dizziness, weakness and numbness.       Tingling  Psychiatric/Behavioral: The patient is nervous/anxious.   All other systems reviewed and are negative.      Objective:   Physical Exam Gen: no distress, normal appearing HEENT: oral mucosa pink and moist, NCAT Cardio: Reg rate Chest: normal effort, normal rate of breathing Abd: soft,  non-distended Ext: no edema Psych: pleasant, normal affect Skin: intact Neuro: Alert and oriented x3 Musculoskeletal: 5/5 strength of lower extremities.      Assessment & Plan:  1) Chronic Pain Syndrome secondary to chronic low back pain from disc herniations.  -Discussed current symptoms of pain and history of pain.  -Discussed benefits of exercise in reducing pain. -Discussed following foods that may reduce pain: 1) Ginger (especially studied for arthritis)- reduce leukotriene production to decrease inflammation 2) Blueberries- high in phytonutrients that decrease inflammation 3) Salmon- marine omega-3s reduce joint swelling and pain 4) Pumpkin seeds- reduce inflammation 5) dark chocolate- reduces inflammation 6) turmeric- reduces inflammation 7) tart cherries - reduce pain and stiffness 8) extra virgin olive oil - its compound olecanthal helps to block prostaglandins  9) chili peppers- can be eaten or applied topically via capsaicin 10) mint- helpful for headache, muscle aches, joint pain, and itching 11) garlic- reduces inflammation  Link to further information on diet for chronic pain:  2) Bilateral knee pain: -prescribed 5% lidocaine patches, can increase to 2 if well tolerated. Advised of risk of rash.

## 2021-01-26 NOTE — Patient Instructions (Signed)
Blue emu oil  1) Ginger (especially studied for arthritis)- reduce leukotriene production to decrease inflammation 2) Blueberries- high in phytonutrients that decrease inflammation 3) Salmon- marine omega-3s reduce joint swelling and pain 4) Pumpkin seeds- reduce inflammation 5) dark chocolate- reduces inflammation 6) turmeric- reduces inflammation 7) tart cherries - reduce pain and stiffness 8) extra virgin olive oil - its compound olecanthal helps to block prostaglandins  9) chili peppers- can be eaten or applied topically via capsaicin 10) mint- helpful for headache, muscle aches, joint pain, and itching 11) garlic- reduces inflammation  Link to further information on diet for chronic pain: [email or web address]

## 2021-02-07 ENCOUNTER — Ambulatory Visit: Payer: Medicare HMO | Admitting: Physical Medicine and Rehabilitation

## 2021-02-10 NOTE — Addendum Note (Signed)
Addended by: Dorie Rank E on: 02/10/2021 01:00 PM   Modules accepted: Orders

## 2021-02-13 ENCOUNTER — Encounter: Payer: Self-pay | Admitting: *Deleted

## 2021-03-02 ENCOUNTER — Encounter: Payer: Medicare HMO | Admitting: Internal Medicine

## 2021-03-07 ENCOUNTER — Encounter: Payer: Medicare HMO | Admitting: Internal Medicine

## 2021-03-21 ENCOUNTER — Encounter: Payer: Self-pay | Admitting: *Deleted

## 2021-04-04 ENCOUNTER — Other Ambulatory Visit: Payer: Self-pay | Admitting: Physical Medicine and Rehabilitation

## 2021-04-04 DIAGNOSIS — B351 Tinea unguium: Secondary | ICD-10-CM

## 2021-04-09 MED FILL — Budesonide-Formoterol Fumarate Dihyd Aerosol 160-4.5 MCG/ACT: RESPIRATORY_TRACT | 30 days supply | Qty: 10.2 | Fill #0 | Status: AC

## 2021-04-10 ENCOUNTER — Other Ambulatory Visit (HOSPITAL_COMMUNITY): Payer: Self-pay

## 2021-04-12 ENCOUNTER — Other Ambulatory Visit (HOSPITAL_COMMUNITY): Payer: Self-pay

## 2021-04-12 MED FILL — Albuterol Sulfate Inhal Aero 108 MCG/ACT (90MCG Base Equiv): RESPIRATORY_TRACT | 75 days supply | Qty: 54 | Fill #0 | Status: AC

## 2021-04-12 MED FILL — Rosuvastatin Calcium Tab 40 MG: ORAL | 90 days supply | Qty: 45 | Fill #0 | Status: AC

## 2021-04-12 MED FILL — Chlorthalidone Tab 25 MG: ORAL | 30 days supply | Qty: 15 | Fill #0 | Status: AC

## 2021-04-12 MED FILL — Lisinopril Tab 40 MG: ORAL | 90 days supply | Qty: 90 | Fill #0 | Status: AC

## 2021-06-07 ENCOUNTER — Other Ambulatory Visit (HOSPITAL_COMMUNITY): Payer: Self-pay

## 2021-06-19 ENCOUNTER — Other Ambulatory Visit (HOSPITAL_COMMUNITY): Payer: Self-pay

## 2021-06-19 MED FILL — Latanoprost Ophth Soln 0.005%: OPHTHALMIC | 25 days supply | Qty: 2.5 | Fill #0 | Status: AC

## 2021-06-22 ENCOUNTER — Other Ambulatory Visit (HOSPITAL_COMMUNITY): Payer: Self-pay

## 2021-08-03 ENCOUNTER — Encounter: Payer: Medicare HMO | Admitting: Internal Medicine

## 2021-08-07 ENCOUNTER — Other Ambulatory Visit (HOSPITAL_COMMUNITY): Payer: Self-pay

## 2021-08-19 MED FILL — Albuterol Sulfate Inhal Aero 108 MCG/ACT (90MCG Base Equiv): RESPIRATORY_TRACT | 75 days supply | Qty: 54 | Fill #1 | Status: AC

## 2021-08-19 MED FILL — Latanoprost Ophth Soln 0.005%: OPHTHALMIC | 25 days supply | Qty: 2.5 | Fill #1 | Status: AC

## 2021-08-21 ENCOUNTER — Other Ambulatory Visit: Payer: Self-pay

## 2021-08-21 MED FILL — Budesonide-Formoterol Fumarate Dihyd Aerosol 160-4.5 MCG/ACT: RESPIRATORY_TRACT | 30 days supply | Qty: 10.2 | Fill #1 | Status: AC

## 2021-08-22 ENCOUNTER — Other Ambulatory Visit: Payer: Self-pay

## 2021-10-30 ENCOUNTER — Other Ambulatory Visit (HOSPITAL_COMMUNITY): Payer: Self-pay

## 2021-10-30 DIAGNOSIS — H524 Presbyopia: Secondary | ICD-10-CM | POA: Diagnosis not present

## 2021-10-30 DIAGNOSIS — H5213 Myopia, bilateral: Secondary | ICD-10-CM | POA: Diagnosis not present

## 2021-10-30 DIAGNOSIS — E1136 Type 2 diabetes mellitus with diabetic cataract: Secondary | ICD-10-CM | POA: Diagnosis not present

## 2021-10-30 DIAGNOSIS — H2513 Age-related nuclear cataract, bilateral: Secondary | ICD-10-CM | POA: Diagnosis not present

## 2021-10-30 DIAGNOSIS — H401131 Primary open-angle glaucoma, bilateral, mild stage: Secondary | ICD-10-CM | POA: Diagnosis not present

## 2021-10-30 DIAGNOSIS — Z0100 Encounter for examination of eyes and vision without abnormal findings: Secondary | ICD-10-CM | POA: Diagnosis not present

## 2021-10-30 MED ORDER — LATANOPROST 0.005 % OP SOLN
1.0000 [drp] | Freq: Every day | OPHTHALMIC | 3 refills | Status: DC
  Filled 2021-10-30: qty 2.5, 25d supply, fill #0
  Filled 2021-12-18: qty 2.5, 25d supply, fill #1
  Filled 2022-01-19: qty 2.5, 25d supply, fill #2
  Filled 2022-04-06 (×2): qty 2.5, 25d supply, fill #3

## 2021-11-16 ENCOUNTER — Other Ambulatory Visit: Payer: Self-pay | Admitting: Internal Medicine

## 2021-11-16 ENCOUNTER — Other Ambulatory Visit (HOSPITAL_COMMUNITY): Payer: Self-pay

## 2021-11-16 DIAGNOSIS — J45909 Unspecified asthma, uncomplicated: Secondary | ICD-10-CM

## 2021-11-16 MED ORDER — BUDESONIDE-FORMOTEROL FUMARATE 160-4.5 MCG/ACT IN AERO
2.0000 | INHALATION_SPRAY | Freq: Two times a day (BID) | RESPIRATORY_TRACT | 2 refills | Status: DC
  Filled 2021-11-16: qty 10.2, 30d supply, fill #0

## 2021-11-16 MED FILL — Albuterol Sulfate Inhal Aero 108 MCG/ACT (90MCG Base Equiv): RESPIRATORY_TRACT | 75 days supply | Qty: 54 | Fill #2 | Status: CN

## 2021-11-17 ENCOUNTER — Other Ambulatory Visit: Payer: Self-pay

## 2021-11-17 ENCOUNTER — Other Ambulatory Visit (HOSPITAL_COMMUNITY): Payer: Self-pay

## 2021-11-17 ENCOUNTER — Encounter: Payer: Self-pay | Admitting: Internal Medicine

## 2021-11-17 ENCOUNTER — Ambulatory Visit (INDEPENDENT_AMBULATORY_CARE_PROVIDER_SITE_OTHER): Payer: Medicare HMO | Admitting: Internal Medicine

## 2021-11-17 VITALS — BP 152/82 | HR 75 | Temp 98.3°F | Ht 73.0 in | Wt 200.5 lb

## 2021-11-17 DIAGNOSIS — I129 Hypertensive chronic kidney disease with stage 1 through stage 4 chronic kidney disease, or unspecified chronic kidney disease: Secondary | ICD-10-CM

## 2021-11-17 DIAGNOSIS — Z0000 Encounter for general adult medical examination without abnormal findings: Secondary | ICD-10-CM

## 2021-11-17 DIAGNOSIS — J45909 Unspecified asthma, uncomplicated: Secondary | ICD-10-CM | POA: Diagnosis not present

## 2021-11-17 DIAGNOSIS — E785 Hyperlipidemia, unspecified: Secondary | ICD-10-CM | POA: Diagnosis not present

## 2021-11-17 DIAGNOSIS — E878 Other disorders of electrolyte and fluid balance, not elsewhere classified: Secondary | ICD-10-CM | POA: Diagnosis not present

## 2021-11-17 DIAGNOSIS — N182 Chronic kidney disease, stage 2 (mild): Secondary | ICD-10-CM

## 2021-11-17 DIAGNOSIS — E1122 Type 2 diabetes mellitus with diabetic chronic kidney disease: Secondary | ICD-10-CM | POA: Diagnosis not present

## 2021-11-17 DIAGNOSIS — I10 Essential (primary) hypertension: Secondary | ICD-10-CM

## 2021-11-17 LAB — POCT GLYCOSYLATED HEMOGLOBIN (HGB A1C): Hemoglobin A1C: 8.2 % — AB (ref 4.0–5.6)

## 2021-11-17 LAB — GLUCOSE, CAPILLARY: Glucose-Capillary: 250 mg/dL — ABNORMAL HIGH (ref 70–99)

## 2021-11-17 MED ORDER — BUDESONIDE-FORMOTEROL FUMARATE 160-4.5 MCG/ACT IN AERO
2.0000 | INHALATION_SPRAY | Freq: Two times a day (BID) | RESPIRATORY_TRACT | 2 refills | Status: DC
  Filled 2021-11-17 (×2): qty 10.2, 30d supply, fill #0
  Filled 2021-12-17: qty 10.2, 30d supply, fill #1
  Filled 2022-01-19: qty 10.2, 30d supply, fill #2

## 2021-11-17 MED ORDER — CHLORTHALIDONE 25 MG PO TABS
12.5000 mg | ORAL_TABLET | Freq: Every day | ORAL | 1 refills | Status: AC
  Filled 2021-11-17: qty 45, 90d supply, fill #0
  Filled 2022-04-06 (×2): qty 45, 90d supply, fill #1
  Filled 2022-10-01: qty 45, 90d supply, fill #2
  Filled 2022-10-04: qty 45, 90d supply, fill #0

## 2021-11-17 MED ORDER — ALBUTEROL SULFATE HFA 108 (90 BASE) MCG/ACT IN AERS
2.0000 | INHALATION_SPRAY | Freq: Four times a day (QID) | RESPIRATORY_TRACT | 3 refills | Status: DC | PRN
  Filled 2021-11-17 (×2): qty 18, 25d supply, fill #0
  Filled 2021-12-17: qty 18, 25d supply, fill #1
  Filled 2022-01-19: qty 18, 25d supply, fill #2
  Filled 2022-04-06: qty 6.7, 17d supply, fill #3
  Filled 2022-04-06: qty 18, 25d supply, fill #3

## 2021-11-17 NOTE — Assessment & Plan Note (Addendum)
BP 155/86 and repeat 152/82 today. Does not monitor BP level at home; educated on the importance of it. BP at goal/not at goal (< 130/80). Reports poor medication compliance. Tolerating medication without adverse effects. Denies headaches, vision changes, lightheadedness, chest pain, SHOB, leg swelling or changes in speech. Exam benign and vitals otherwise wnl. Does complain of tinnitus in bilateral ears x couple weeks with no associated hearing loss, vertigo, or ear discharge; suspect this is from uncontrolled HTN and will CTM. Counseled on the importance of daily exercise, low salt diet, and weight loss. ? ?Continue Lisinopril 40 mg daily and Chlorthalidone 12.5 mg daily with no med changes as he reports he will be compliant with medications.  ?BP log and bring to next visit  ?Continue lifestyle changes ?F/u on BMP ?Repeat BMP 1 month  ? ?Addendum:  ?Cr 1.29, near baseline. Continue current regimen, and repeat BMP in 1 mo  ?

## 2021-11-17 NOTE — Assessment & Plan Note (Addendum)
LDL 122 in 2020. He reports taking Crestor 40 for primary prevention without adverse effects, though he is not very compliant.  ? ?Continue Crestor 40 mg  ?Lipid panel today  ? ?Addendum:  ?LDL 125, not at goal. Called pt; he reports that since he has not been taking his statin daily as prescribed previously, he would like to start taking his Crestor daily and see how his LDL improves, rather than adjusting any medications at this time. I think this is reasonable, given previously poorly compliant. Will continue with current regimen, recheck lipid panel in 3-6 mo and consider Zetia if LDL not at goal.   ?

## 2021-11-17 NOTE — Assessment & Plan Note (Signed)
Flu shot offered, he declined ?Encouraged to get Zoster and COVID booster at healthcare dept or pharmacy  ?

## 2021-11-17 NOTE — Patient Instructions (Signed)
Please start taking your medications as prescribed ? ?Come back in 4 weeks  ?

## 2021-11-17 NOTE — Progress Notes (Signed)
? ?CC: routine clinic follow up, ear check  ? ?HPI: ? ?[name] is a 56 y.o. male with a PMHx stated below and presents today for stated above. Please see the Encounters tab for problem-based Assessment & Plan for additional details.  ? ?Past Medical History:  ?Diagnosis Date  ? Allergy   ? Asthma   ? Bilateral chronic knee pain   ? Chronic back pain   ? Chronic shoulder pain   ? Depression   ? Diabetes mellitus without complication (HCC)   ? diagnosed in 2015  ? GERD (gastroesophageal reflux disease)   ? History of herniated intervertebral disc   ? Hx of cardiovascular stress test   ? a. Lexiscan Myoview 10/29/12:  EF 56%, no ischemia  ? Hx of echocardiogram   ? a. Echo 10/24/12:  EF 60-65%, normal wall thickness, normal diastolic fxn  ? Hyperlipidemia   ? Hypertension   ? Reflux   ? Renal disorder   ? chronic kidney disease  ? ? ?Current Outpatient Medications on File Prior to Visit  ?Medication Sig Dispense Refill  ? acetaminophen (TYLENOL) 650 MG CR tablet Take 1,300 mg by mouth every 8 (eight) hours as needed for pain.    ? latanoprost (XALATAN) 0.005 % ophthalmic solution Place 1 drop into both eyes at bedtime.    ? latanoprost (XALATAN) 0.005 % ophthalmic solution Place 1 drop into both eyes at bedtime. 2.5 mL 3  ? latanoprost (XALATAN) 0.005 % ophthalmic solution Place 1 drop into both eyes at bedtime. 2.5 mL 3  ? lidocaine (LIDODERM) 5 % Place 1 patch onto the skin daily. Remove & Discard patch within 12 hours or as directed by MD 90 patch 1  ? lisinopril (ZESTRIL) 40 MG tablet TAKE 1 TABLET (40 MG TOTAL) BY MOUTH DAILY. 90 tablet 1  ? metFORMIN (GLUCOPHAGE-XR) 500 MG 24 hr tablet TAKE 1 TABLET (500 MG TOTAL) BY MOUTH 2 (TWO) TIMES DAILY. 180 tablet 3  ? phenylephrine-shark liver oil-mineral oil-petrolatum (PREPARATION H) 0.25-3-14-71.9 % rectal ointment Place 1 application rectally 2 (two) times daily as needed for hemorrhoids.    ? rosuvastatin (CRESTOR) 40 MG tablet TAKE 0.5 TABLETS (20 MG TOTAL)  BY MOUTH DAILY. 90 tablet 3  ? ?No current facility-administered medications on file prior to visit.  ? ? ?Family History  ?Problem Relation Age of Onset  ? Hypertension Other   ? Diabetes Other   ? Heart attack Other   ? Diabetes Mother   ? Hypertension Mother   ? Bladder Cancer Mother   ? Hypertension Sister   ? Stroke Sister   ? Diabetes Sister   ? Hypertension Brother   ? Asthma Daughter   ? Hypertension Sister   ? Diabetes Sister   ? Hyperlipidemia Neg Hx   ? Sudden death Neg Hx   ? ? ?Social History  ? ?Socioeconomic History  ? Marital status: Divorced  ?  Spouse name: Not on file  ? Number of children: 5  ? Years of education: 43  ? Highest education level: Not on file  ?Occupational History  ? Occupation: Disabled  ?Tobacco Use  ? Smoking status: Never  ? Smokeless tobacco: Never  ?Substance and Sexual Activity  ? Alcohol use: Yes  ?  Alcohol/week: 3.0 standard drinks  ?  Types: 3 Cans of beer per week  ?  Comment: Beer.  ? Drug use: Yes  ?  Types: Marijuana  ?  Comment: sometimes.  ? Sexual activity: Yes  ?  Birth control/protection: Condom  ?Other Topics Concern  ? Not on file  ?Social History Narrative  ? Current Social History 12/07/2020    ?   ? Patient lives alone in a ground floor apartment.. There are not steps up to the entrance the patient uses.   ?   ? Patient's method of transportation is personal car.  ?   ? The highest level of education was high school diploma.  ?   ? The patient currently disabled.  ?   ? Identified important Relationships are "My kids, family, girlfriend"   ?   ? Pets : None  ?    ? 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   ? Current Stressors: "Try to avoid all stress"   ?   ? Religious / Personal Beliefs: "I believe in God, but not overly religious"   ?   ? L. Ducatte, BSN, RN-BC   ?    ? ?Social Determinants of Health  ? ?Financial Resource Strain: Not on file  ?Food Insecurity: Not on file  ?Transportation Needs: Not on file  ?Physical  Activity: Not on file  ?Stress: Not on file  ?Social Connections: Not on file  ?Intimate Partner Violence: Not on file  ? ? ?Review of Systems: ?ROS negative except for what is noted on the assessment and plan. ? ?Vitals:  ? 11/17/21 0932  ?BP: (!) 155/86  ?Pulse: 89  ?Temp: 98.3 ?F (36.8 ?C)  ?TempSrc: Oral  ?SpO2: 100%  ?Weight: 200 lb 8 oz (90.9 kg)  ?Height: 6\' 1"  (1.854 m)  ? ? ?Physical Exam: ?Constitutional: alert, well-appearing, in NAD ?HENT: normocephalic, atraumatic, mucous membranes moist; ear canals clear, TM clear and visible with with no bulging, no drainage from the ear. No pain with manipulation of the ear.  ?Eyes: conjunctiva non-erythematous, EOMI ?Cardiovascular: RRR, no m/r/g, non-edematous bilateral LE ?Pulmonary/Chest: normal work of breathing on RA, LCTAB ?Abdominal: soft, non-tender to palpation, non-distended ?MSK: normal bulk and tone  ?Neurological: A&O x 3 and follows commands  ?Skin: warm and dry  ?Psych: normal behavior, normal affect  ? ?Assessment & Plan:  ? ?See Encounters Tab for problem based charting. ? ?Patient discussed with Dr. ? ?Heide Spark, MD  ?Internal Medicine Resident, PGY-1 ?[facility]  ?

## 2021-11-17 NOTE — Assessment & Plan Note (Signed)
A1c 8.2 today, up from 7.3 one year ago. Diabetes not at goal on current regimen, likely 2/2 poor medication compliance, diet, and poor follow up. Has only been taking metformin 500 BID as once daily and sometimes only once a couple times week. Tolerating medication without adverse effects. Suggested adding Ozempic, but he is not interested in injectable. He is not interested in trying other orals, as he reports he is highly motivated at this time to become very compliant with his medications and re-check A1c in 3 mo, and if not improved, he will consider alternatives.  ?Counseled on the benefits of daily exercise, limiting processed foods and high sugar foods, and weight loss. No hypoglycemic episodes. Denies polydipsia, polyuria, weight loss, lethargy, blurry vision, and changes in sensation. No wounds or ulcer of bilateral feet. Benign physical exam and vitals wnl.   ? ?Continue metformin 500 BID ?Continue lifestyle modifications ?Foot exam done  ?A1c in 3 months ? ?

## 2021-11-20 LAB — BMP8+ANION GAP
Anion Gap: 14 mmol/L (ref 10.0–18.0)
BUN/Creatinine Ratio: 7 — ABNORMAL LOW (ref 9–20)
BUN: 9 mg/dL (ref 6–24)
CO2: 25 mmol/L (ref 20–29)
Calcium: 9.5 mg/dL (ref 8.7–10.2)
Chloride: 101 mmol/L (ref 96–106)
Creatinine, Ser: 1.29 mg/dL — ABNORMAL HIGH (ref 0.76–1.27)
Glucose: 181 mg/dL — ABNORMAL HIGH (ref 70–99)
Potassium: 4.3 mmol/L (ref 3.5–5.2)
Sodium: 140 mmol/L (ref 134–144)
eGFR: 65 mL/min/{1.73_m2} (ref >59)

## 2021-11-20 LAB — LIPID PANEL
Chol/HDL Ratio: 4.1 ratio (ref 0.0–5.0)
Cholesterol, Total: 214 mg/dL — ABNORMAL HIGH (ref 100–199)
HDL: 52 mg/dL (ref >39)
LDL Chol Calc (NIH): 125 mg/dL — ABNORMAL HIGH (ref 0–99)
Triglycerides: 209 mg/dL — ABNORMAL HIGH (ref 0–149)
VLDL Cholesterol Cal: 37 mg/dL (ref 5–40)

## 2021-11-20 NOTE — Progress Notes (Signed)
Internal Medicine Clinic Attending  Case discussed with Dr. Patel  At the time of the visit.  We reviewed the resident's history and exam and pertinent patient test results.  I agree with the assessment, diagnosis, and plan of care documented in the resident's note.  

## 2021-12-14 ENCOUNTER — Other Ambulatory Visit (HOSPITAL_COMMUNITY): Payer: Self-pay

## 2021-12-18 ENCOUNTER — Other Ambulatory Visit (HOSPITAL_COMMUNITY): Payer: Self-pay

## 2022-01-19 ENCOUNTER — Other Ambulatory Visit: Payer: Self-pay

## 2022-01-19 ENCOUNTER — Other Ambulatory Visit (HOSPITAL_COMMUNITY): Payer: Self-pay

## 2022-01-19 ENCOUNTER — Other Ambulatory Visit: Payer: Self-pay | Admitting: Internal Medicine

## 2022-01-19 DIAGNOSIS — E785 Hyperlipidemia, unspecified: Secondary | ICD-10-CM

## 2022-01-19 DIAGNOSIS — I10 Essential (primary) hypertension: Secondary | ICD-10-CM

## 2022-01-19 MED ORDER — LISINOPRIL 40 MG PO TABS
ORAL_TABLET | Freq: Every day | ORAL | 1 refills | Status: DC
  Filled 2022-01-19: qty 90, 90d supply, fill #0
  Filled 2022-05-01 – 2022-05-29 (×3): qty 90, 90d supply, fill #1

## 2022-01-19 MED ORDER — ROSUVASTATIN CALCIUM 40 MG PO TABS
ORAL_TABLET | ORAL | 3 refills | Status: AC
  Filled 2022-01-19: qty 45, 90d supply, fill #0
  Filled 2022-05-01 – 2022-05-28 (×2): qty 45, 90d supply, fill #1
  Filled 2022-10-04: qty 45, 90d supply, fill #2

## 2022-01-22 ENCOUNTER — Other Ambulatory Visit (HOSPITAL_COMMUNITY): Payer: Self-pay

## 2022-01-23 ENCOUNTER — Other Ambulatory Visit (HOSPITAL_COMMUNITY): Payer: Self-pay

## 2022-01-23 ENCOUNTER — Telehealth: Payer: Self-pay

## 2022-01-23 NOTE — Telephone Encounter (Signed)
Lidocaine patches approved through 09/16/2022 ?

## 2022-01-23 NOTE — Telephone Encounter (Signed)
PA submitted for Lidocaine pathes ?

## 2022-01-25 ENCOUNTER — Other Ambulatory Visit (HOSPITAL_COMMUNITY): Payer: Self-pay

## 2022-01-27 ENCOUNTER — Other Ambulatory Visit (HOSPITAL_COMMUNITY): Payer: Self-pay

## 2022-01-27 ENCOUNTER — Other Ambulatory Visit: Payer: Self-pay | Admitting: Student

## 2022-01-27 DIAGNOSIS — E1122 Type 2 diabetes mellitus with diabetic chronic kidney disease: Secondary | ICD-10-CM

## 2022-01-29 ENCOUNTER — Other Ambulatory Visit (HOSPITAL_COMMUNITY): Payer: Self-pay

## 2022-01-29 MED ORDER — METFORMIN HCL ER 500 MG PO TB24
ORAL_TABLET | Freq: Two times a day (BID) | ORAL | 3 refills | Status: AC
  Filled 2022-01-29: qty 180, 90d supply, fill #0
  Filled 2022-05-29 – 2022-06-28 (×2): qty 180, 90d supply, fill #1
  Filled 2022-10-01 – 2022-10-04 (×2): qty 180, 90d supply, fill #2

## 2022-03-11 ENCOUNTER — Encounter: Payer: Self-pay | Admitting: *Deleted

## 2022-03-27 ENCOUNTER — Other Ambulatory Visit: Payer: Self-pay

## 2022-03-30 ENCOUNTER — Ambulatory Visit (INDEPENDENT_AMBULATORY_CARE_PROVIDER_SITE_OTHER): Payer: Medicare HMO

## 2022-03-30 DIAGNOSIS — Z0000 Encounter for general adult medical examination without abnormal findings: Secondary | ICD-10-CM

## 2022-03-30 NOTE — Progress Notes (Cosign Needed Addendum)
Subjective:   Javier Beasley is a 56 y.o. male who presents for Medicare Annual/Subsequent preventive examination. I connected with  LINCOLN GINLEY on 06/01/22 by a audio enabled telemedicine application and verified that I am speaking with the correct person using two identifiers.  Patient Location: Home  Provider Location: Office/Clinic  I discussed the limitations of evaluation and management by telemedicine. The patient expressed understanding and agreed to proceed.   Review of Systems    DEFERRED TO PCP  Cardiac Risk Factors include: advanced age (>32men, >47 women);diabetes mellitus;hypertension;dyslipidemia     Objective:    There were no vitals filed for this visit. There is no height or weight on file to calculate BMI.     03/30/2022   12:40 PM 11/17/2021   10:11 AM 12/12/2020   10:37 AM 12/07/2020    1:45 PM 07/20/2019    2:39 PM 06/18/2019    9:19 AM 04/06/2019    1:32 PM  Advanced Directives  Does Patient Have a Medical Advance Directive? Yes No No No Yes Yes No  Type of Advance Directive Living will    Healthcare Power of [place];Living will Healthcare Power of [place];Living will   Copy of Healthcare Power of Attorney in Chart?     No - copy requested No - copy requested   Would patient like information on creating a medical advance directive?  No - Patient declined Yes (MAU/Ambulatory/Procedural Areas - Information given) Yes (MAU/Ambulatory/Procedural Areas - Information given)   No - Patient declined    Current Medications (verified) Outpatient Encounter Medications as of 03/30/2022  Medication Sig   acetaminophen (TYLENOL) 650 MG CR tablet Take 1,300 mg by mouth every 8 (eight) hours as needed for pain.   chlorthalidone (HYGROTON) 25 MG tablet Take 1/2 tablet by mouth daily.   latanoprost (XALATAN) 0.005 % ophthalmic solution Place 1 drop into both eyes at bedtime.   lidocaine (LIDODERM) 5 % Place 1 patch onto the skin daily. Remove & Discard patch within 12  hours or as directed by MD   lisinopril (ZESTRIL) 40 MG tablet TAKE 1 TABLET (40 MG TOTAL) BY MOUTH DAILY.   metFORMIN (GLUCOPHAGE-XR) 500 MG 24 hr tablet TAKE 1 TABLET (500 MG TOTAL) BY MOUTH 2 (TWO) TIMES DAILY.   rosuvastatin (CRESTOR) 40 MG tablet TAKE 1/2 TABLET (20 MG TOTAL) BY MOUTH DAILY.   [DISCONTINUED] albuterol (VENTOLIN HFA) 108 (90 Base) MCG/ACT inhaler Inhale 2 puffs into the lungs every 6 (six) hours as needed for wheezing or shortness of breath.   [DISCONTINUED] budesonide-formoterol (SYMBICORT) 160-4.5 MCG/ACT inhaler Inhale 2 puffs into the lungs 2 (two) times daily.   [DISCONTINUED] latanoprost (XALATAN) 0.005 % ophthalmic solution Place 1 drop into both eyes at bedtime.   [DISCONTINUED] phenylephrine-shark liver oil-mineral oil-petrolatum (PREPARATION H) 0.25-3-14-71.9 % rectal ointment Place 1 application rectally 2 (two) times daily as needed for hemorrhoids.   latanoprost (XALATAN) 0.005 % ophthalmic solution Place 1 drop into both eyes at bedtime.   No facility-administered encounter medications on file as of 03/30/2022.    Allergies (verified) Yeast-related products and Pork-derived products   History: Past Medical History:  Diagnosis Date   Allergy    Asthma    Bilateral chronic knee pain    Chronic back pain    Chronic shoulder pain    Depression    Diabetes mellitus without complication (HCC)    diagnosed in 2015   GERD (gastroesophageal reflux disease)    History of herniated intervertebral disc  Hx of cardiovascular stress test    a. Lexiscan Myoview 10/29/12:  EF 56%, no ischemia   Hx of echocardiogram    a. Echo 10/24/12:  EF 60-65%, normal wall thickness, normal diastolic fxn   Hyperlipidemia    Hypertension    Reflux    Renal disorder    chronic kidney disease   Past Surgical History:  Procedure Laterality Date   BALLOON DILATION N/A 10/24/2012   Procedure: BALLOON DILATION;  Surgeon: Arta Silence, MD;  Location[facility] ENDOSCOPY;  Service:  Endoscopy;  Laterality: N/A;   COLONOSCOPY WITH PROPOFOL N/A 05/21/2018   Procedure: COLONOSCOPY WITH PROPOFOL;  Surgeon: Arta Silence, MD;  Location: [facility] ENDOSCOPY;  Service: Endoscopy;  Laterality: N/A;   ESOPHAGOGASTRODUODENOSCOPY Left 10/24/2012   Procedure: ESOPHAGOGASTRODUODENOSCOPY (EGD);  Surgeon: Arta Silence, MD;  Location: [facility] ENDOSCOPY;  Service: Endoscopy;  Laterality: Left;  possible balloon dilation   KNEE ARTHROSCOPY WITH EXCISION PLICA  [id number]   Procedure: KNEE ARTHROSCOPY WITH EXCISION PLICA;  Surgeon: Leandrew Koyanagi, MD;  Location: [place];  Service: Orthopedics;;   KNEE ARTHROSCOPY WITH LATERAL MENISECTOMY  05/11/2015   Procedure: KNEE ARTHROSCOPY WITH LATERAL MENISECTOMY;  Surgeon: Leandrew Koyanagi, MD;  Location: [place];  Service: Orthopedics;;   SYNOVECTOMY Right 05/11/2015   Procedure: SYNOVECTOMY;  Surgeon: Leandrew Koyanagi, MD;  Location: [place];  Service: Orthopedics;  Laterality: Right;   Family History  Problem Relation Age of Onset   Hypertension Other    Diabetes Other    Heart attack Other    Diabetes Mother    Hypertension Mother    Bladder Cancer Mother    Hypertension Sister    Stroke Sister    Diabetes Sister    Hypertension Brother    Asthma Daughter    Hypertension Sister    Diabetes Sister    Hyperlipidemia Neg Hx    Sudden death Neg Hx    Social History   Socioeconomic History   Marital status: Divorced    Spouse name: Not on file   Number of children: 5   Years of education: 12   Highest education level: Not on file  Occupational History   Occupation: Disabled  Tobacco Use   Smoking status: Never   Smokeless tobacco: Never  Substance and Sexual Activity   Alcohol use: Yes    Alcohol/week: 3.0 standard drinks of alcohol    Types: 3 Cans of beer per week    Comment: Beer.   Drug use: Yes    Types: Marijuana    Comment: sometimes.   Sexual activity: Yes    Birth control/protection:  Condom  Other Topics Concern   Not on file  Social History Narrative   Current Social History 12/07/2020        Patient lives alone in a ground floor apartment.. There are not steps up to the entrance the patient uses.       Patient's method of transportation is personal car.      The highest level of education was high school diploma.      The patient currently disabled.      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 Ducatte, BSN, RN-BC        Social Determinants of Health   Financial Resource Strain: Low Risk  (03/30/2022)   Overall Financial Resource Strain (CARDIA)    Difficulty of Paying Living Expenses: Not hard at all  Food Insecurity: No Food Insecurity (03/30/2022)   Hunger Vital Sign    Worried About Running Out of Food in the Last Year: Never true    Ran Out of Food in the Last Year: Never true  Transportation Needs: No Transportation Needs (03/30/2022)   PRAPARE - Administrator, Civil Service (Medical): No    Lack of Transportation (Non-Medical): No  Physical Activity: Insufficiently Active (03/30/2022)   Exercise Vital Sign    Days of Exercise per Week: 2 days    Minutes of Exercise per Session: 10 min  Stress: Stress Concern Present (03/30/2022)   [employer] of Occupational Health - Occupational Stress Questionnaire    Feeling of Stress : Very much  Social Connections: Moderately Isolated (03/30/2022)   Social Connection and Isolation Panel [NHANES]    Frequency of Communication with Friends and Family: Twice a week    Frequency of Social Gatherings with Friends and Family: Once a week    Attends Religious Services: Never    Database administrator or Organizations: Yes    Attends Probation officer: More than 4 times per year    Marital Status: Separated    Tobacco Counseling Counseling given: Not Answered   Clinical Intake:  Pre-visit preparation completed: Yes  Pain : No/denies pain     Diabetes: Yes Did pt. bring in CBG monitor from home?: No  How often do you need to have someone help you when you read instructions, pamphlets, or other written materials from your doctor or pharmacy?: 1 - Never What is the last grade level you completed in school?: 12 grade  Diabetic?yes   Interpreter Needed?: No  Information entered by :: Zinia Innocent   Activities of Daily Living    03/30/2022   12:41 PM 11/17/2021    9:31 AM  In your present state of health, do you have any difficulty performing the following activities:  Hearing? 0 1  Vision? 0 0  Difficulty concentrating or making decisions? 0 0  Walking or climbing stairs? 1 1  Dressing or bathing? 0 0  Doing errands, shopping? 0 0  Preparing Food and eating ? N   Using the Toilet? N   In the past six months, have you accidently leaked urine? N   Do you have problems with loss of bowel control? N   Managing your Medications? N   Managing your Finances? N   Housekeeping or managing your Housekeeping? N     Patient Care Team: Modena Slater, DO as PCP - General Jethro Bolus, MD as Consulting Physician (Ophthalmology)  Indicate any recent Medical Services you may have received from other than [facility] providers in the past year (date may be approximate).     Assessment:   This is a routine wellness examination for Meko.  Hearing/Vision screen No results found.  Dietary issues and exercise activities discussed: Current Exercise Habits: Home exercise routine, Time (Minutes): 10, Frequency (Times/Week): 1, Weekly Exercise (Minutes/Week): 10, Intensity: Mild, Exercise limited by: None identified   Goals Addressed   None   Depression Screen    03/30/2022   12:32 PM 11/17/2021    9:31  AM  01/26/2021   10:05 AM 12/12/2020   11:13 AM 12/07/2020    1:56 PM 05/03/2020   11:00 AM 07/20/2019    3:52 PM  PHQ 2/9 Scores  PHQ - 2 Score 2 0 0 0 1 0 2  PHQ- 9 Score 9  3 3 7  0 10    Fall Risk    03/30/2022   12:40 PM 11/17/2021    9:31 AM 01/26/2021   10:06 AM 12/07/2020    1:42 PM 05/03/2020   11:00 AM  Fall Risk   Falls in the past year? 1 1 1 1  0  Number falls in past yr: 0 0 0 0 0  Injury with Fall? 1 0 1 1 0  Comment    right elbow   Risk for fall due to : Other (Comment)   History of fall(s);Impaired balance/gait   Risk for fall due to: Comment fell off the sofa   legs feel weak from back pain   Follow up Falls evaluation completed;Falls prevention discussed Falls evaluation completed;Education provided  Education provided;Falls prevention discussed     FALL RISK PREVENTION PERTAINING TO THE HOME:  Any stairs in or around the home? Yes  If so, are there any without handrails? No  Home free of loose throw rugs in walkways, pet beds, electrical cords, etc? No  Adequate lighting in your home to reduce risk of falls? Yes   ASSISTIVE DEVICES UTILIZED TO PREVENT FALLS:  Life alert? No  Use of a cane, walker or w/c? No  Grab bars in the bathroom? No  Shower chair or bench in shower? no Elevated toilet seat or a handicapped toilet? No   TIMED UP AND GO:  Was the test performed? No .  Length of time to ambulate 10 feet: N/A  sec.     Cognitive Function:        03/30/2022   12:43 PM  6CIT Screen  What Year? 0 points  What month? 0 points  What time? 0 points  Count back from 20 0 points  Months in reverse 0 points  Repeat phrase 0 points  Total Score 0 points    Immunizations Immunization History  Administered Date(s) Administered   Influenza,inj,Quad PF,6+ Mos 07/05/2014   PFIZER(Purple Top)SARS-COV-2 Vaccination 11/21/2019, 12/12/2019, 08/25/2020   Tdap 03/05/2012    TDAP status: Up to date  Flu Vaccine status: Up to date  Pneumococcal vaccine  status: Due, Education has been provided regarding the importance of this vaccine. Advised may receive this vaccine at local pharmacy or Health Dept. Aware to provide a copy of the vaccination record if obtained from local pharmacy or Health Dept. Verbalized acceptance and understanding.  Covid-19 vaccine status: Completed vaccines  Qualifies for Shingles Vaccine? Yes   Zostavax completed No   Shingrix Completed?: No.    Education has been provided regarding the importance of this vaccine. Patient has been advised to call insurance company to determine out of pocket expense if they have not yet received this vaccine. Advised may also receive vaccine at local pharmacy or Health Dept. Verbalized acceptance and understanding.  Screening Tests Health Maintenance  Topic Date Due   Hepatitis C Screening  Never done   Diabetic kidney evaluation - Urine ACR  07/06/2015   Zoster Vaccines- Shingrix (1 of 2) Never done   COVID-19 Vaccine (4 - Pfizer series) 10/20/2020   TETANUS/TDAP  03/05/2022   INFLUENZA VACCINE  04/17/2022   COLON CANCER SCREENING ANNUAL FOBT  01/10/2029 (Originally  07/22/2019)   HEMOGLOBIN A1C  10/07/2022   OPHTHALMOLOGY EXAM  10/18/2022   Diabetic kidney evaluation - GFR measurement  04/07/2023   FOOT EXAM  04/07/2023   LIPID PANEL  04/07/2023   COLONOSCOPY (Pts 45-55yrs Insurance coverage will need to be confirmed)  05/21/2028   HIV Screening  Completed   HPV VACCINES  Aged Out    Health Maintenance  Health Maintenance Due  Topic Date Due   Hepatitis C Screening  Never done   Diabetic kidney evaluation - Urine ACR  07/06/2015   Zoster Vaccines- Shingrix (1 of 2) Never done   COVID-19 Vaccine (4 - Pfizer series) 10/20/2020   TETANUS/TDAP  03/05/2022   INFLUENZA VACCINE  04/17/2022    Colorectal cancer screening: Type of screening: Colonoscopy. Completed 07/21/2018. Repeat every POSTPONED TILL 01/10/2029 years  Lung Cancer Screening: (Low Dose CT Chest recommended if  Age 91-80 years, 30 pack-year currently smoking OR have quit w/in 15years.) does not qualify.   Lung Cancer Screening Referral: DEFERRED TO PCP   Additional Screening:  Hepatitis C Screening: does qualify; Completed DEFERRED TO PCP   Vision Screening: Recommended annual ophthalmology exams for early detection of glaucoma and other disorders of the eye. Is the patient up to date with their annual eye exam?  No  Who is the provider or what is the name of the office in which the patient attends annual eye exams?  DR  Gershon Crane  If pt is not established with a provider, would they like to be referred to a provider to establish care? No .   Dental Screening: Recommended annual dental exams for proper oral hygiene  Community Resource Referral / Chronic Care Management: CRR required this visit?  No   CCM required this visit?  No      Plan:     I have personally reviewed and noted the following in the patient's chart:   Medical and social history Use of alcohol, tobacco or illicit drugs  Current medications and supplements including opioid prescriptions. Patient is not currently taking opioid prescriptions. Functional ability and status Nutritional status Physical activity Advanced directives List of other physicians Hospitalizations, surgeries, and ER visits in previous 12 months Vitals Screenings to include cognitive, depression, and falls Referrals and appointments  In addition, I have reviewed and discussed with patient certain preventive protocols, quality metrics, and best practice recommendations. A written personalized care plan for preventive services as well as general preventive health recommendations were provided to patient.     Judyann Munson, CMA   06/01/2022   Nurse Notes: NON FACE TO FACE   Mr. Marchesano , Thank you for taking time to come for your Medicare Wellness Visit. I appreciate your ongoing commitment to your health goals. Please review the following plan  we discussed and let me know if I can assist you in the future.   These are the goals we discussed:  Goals      Exercise 2x per week (15 min per time)     Push ups, stretches, seated and standing exercises with exercise band.        This is a list of the screening recommended for you and due dates:  Health Maintenance  Topic Date Due   Hepatitis C Screening: USPSTF Recommendation to screen - Ages 55-79 yo.  Never done   Yearly kidney health urinalysis for diabetes  07/06/2015   Zoster (Shingles) Vaccine (1 of 2) Never done   COVID-19 Vaccine (4 - Pfizer series) 10/20/2020  Tetanus Vaccine  03/05/2022   Flu Shot  04/17/2022   Stool Blood Test  01/10/2029*   Hemoglobin A1C  10/07/2022   Eye exam for diabetics  10/18/2022   Yearly kidney function blood test for diabetes  04/07/2023   Complete foot exam   04/07/2023   Lipid (cholesterol) test  04/07/2023   Colon Cancer Screening  05/21/2028   HIV Screening  Completed   HPV Vaccine  Aged Out  *Topic was postponed. The date shown is not the original due date.

## 2022-03-30 NOTE — Progress Notes (Signed)
I discussed the AWV findings with the provider who conducted the visit. I was present in the office suite and immediately available to provide assistance and direction throughout the time the service was provided.  ?

## 2022-03-30 NOTE — Patient Instructions (Signed)
Health Maintenance, Male Adopting a healthy lifestyle and getting preventive care are important in promoting health and wellness. Ask your health care provider about: The right schedule for you to have regular tests and exams. Things you can do on your own to prevent diseases and keep yourself healthy. What should I know about diet, weight, and exercise? Eat a healthy diet  Eat a diet that includes plenty of vegetables, fruits, low-fat dairy products, and lean protein. Do not eat a lot of foods that are high in solid fats, added sugars, or sodium. Maintain a healthy weight Body mass index (BMI) is a measurement that can be used to identify possible weight problems. It estimates body fat based on height and weight. Your health care provider can help determine your BMI and help you achieve or maintain a healthy weight. Get regular exercise Get regular exercise. This is one of the most important things you can do for your health. Most adults should: Exercise for at least 150 minutes each week. The exercise should increase your heart rate and make you sweat (moderate-intensity exercise). Do strengthening exercises at least twice a week. This is in addition to the moderate-intensity exercise. Spend less time sitting. Even light physical activity can be beneficial. Watch cholesterol and blood lipids Have your blood tested for lipids and cholesterol at 56 years of age, then have this test every 5 years. You may need to have your cholesterol levels checked more often if: Your lipid or cholesterol levels are high. You are older than 56 years of age. You are at high risk for heart disease. What should I know about cancer screening? Many types of cancers can be detected early and may often be prevented. Depending on your health history and family history, you may need to have cancer screening at various ages. This may include screening for: Colorectal cancer. Prostate cancer. Skin cancer. Lung  cancer. What should I know about heart disease, diabetes, and high blood pressure? Blood pressure and heart disease High blood pressure causes heart disease and increases the risk of stroke. This is more likely to develop in people who have high blood pressure readings or are overweight. Talk with your health care provider about your target blood pressure readings. Have your blood pressure checked: Every 3-5 years if you are 18-39 years of age. Every year if you are 40 years old or older. If you are between the ages of 65 and 75 and are a current or former smoker, ask your health care provider if you should have a one-time screening for abdominal aortic aneurysm (AAA). Diabetes Have regular diabetes screenings. This checks your fasting blood sugar level. Have the screening done: Once every three years after age 45 if you are at a normal weight and have a low risk for diabetes. More often and at a younger age if you are overweight or have a high risk for diabetes. What should I know about preventing infection? Hepatitis B If you have a higher risk for hepatitis B, you should be screened for this virus. Talk with your health care provider to find out if you are at risk for hepatitis B infection. Hepatitis C Blood testing is recommended for: Everyone born from 1945 through 1965. Anyone with known risk factors for hepatitis C. Sexually transmitted infections (STIs) You should be screened each year for STIs, including gonorrhea and chlamydia, if: You are sexually active and are younger than 56 years of age. You are older than 56 years of age and your   health care provider tells you that you are at risk for this type of infection. Your sexual activity has changed since you were last screened, and you are at increased risk for chlamydia or gonorrhea. Ask your health care provider if you are at risk. Ask your health care provider about whether you are at high risk for HIV. Your health care provider  may recommend a prescription medicine to help prevent HIV infection. If you choose to take medicine to prevent HIV, you should first get tested for HIV. You should then be tested every 3 months for as long as you are taking the medicine. Follow these instructions at home: Alcohol use Do not drink alcohol if your health care provider tells you not to drink. If you drink alcohol: Limit how much you have to 0-2 drinks a day. Know how much alcohol is in your drink. In the [place] one drink equals one 12 oz bottle of beer (355 mL), one 5 oz glass of wine (148 mL), or one 1 oz glass of hard liquor (44 mL). Lifestyle Do not use any products that contain nicotine or tobacco. These products include cigarettes, chewing tobacco, and vaping devices, such as e-cigarettes. If you need help quitting, ask your health care provider. Do not use street drugs. Do not share needles. Ask your health care provider for help if you need support or information about quitting drugs. General instructions Schedule regular health, dental, and eye exams. Stay current with your vaccines. Tell your health care provider if: You often feel depressed. You have ever been abused or do not feel safe at home. Summary Adopting a healthy lifestyle and getting preventive care are important in promoting health and wellness. Follow your health care provider's instructions about healthy diet, exercising, and getting tested or screened for diseases. Follow your health care provider's instructions on monitoring your cholesterol and blood pressure. This information is not intended to replace advice given to you by your health care provider. Make sure you discuss any questions you have with your health care provider. Document Revised: 01/23/2021 Document Reviewed: 01/23/2021 Elsevier Patient Education  2023 [employer] Inc.  

## 2022-04-06 ENCOUNTER — Other Ambulatory Visit (HOSPITAL_COMMUNITY): Payer: Self-pay

## 2022-04-06 ENCOUNTER — Ambulatory Visit (INDEPENDENT_AMBULATORY_CARE_PROVIDER_SITE_OTHER): Payer: Medicare HMO | Admitting: Student

## 2022-04-06 VITALS — BP 130/85 | HR 73 | Temp 98.3°F | Wt 196.3 lb

## 2022-04-06 DIAGNOSIS — M79609 Pain in unspecified limb: Secondary | ICD-10-CM | POA: Diagnosis not present

## 2022-04-06 DIAGNOSIS — I129 Hypertensive chronic kidney disease with stage 1 through stage 4 chronic kidney disease, or unspecified chronic kidney disease: Secondary | ICD-10-CM

## 2022-04-06 DIAGNOSIS — R252 Cramp and spasm: Secondary | ICD-10-CM

## 2022-04-06 DIAGNOSIS — I10 Essential (primary) hypertension: Secondary | ICD-10-CM

## 2022-04-06 DIAGNOSIS — E559 Vitamin D deficiency, unspecified: Secondary | ICD-10-CM | POA: Diagnosis not present

## 2022-04-06 DIAGNOSIS — J45909 Unspecified asthma, uncomplicated: Secondary | ICD-10-CM

## 2022-04-06 DIAGNOSIS — N182 Chronic kidney disease, stage 2 (mild): Secondary | ICD-10-CM | POA: Diagnosis not present

## 2022-04-06 DIAGNOSIS — E785 Hyperlipidemia, unspecified: Secondary | ICD-10-CM | POA: Diagnosis not present

## 2022-04-06 DIAGNOSIS — E1122 Type 2 diabetes mellitus with diabetic chronic kidney disease: Secondary | ICD-10-CM | POA: Diagnosis not present

## 2022-04-06 MED ORDER — BUDESONIDE-FORMOTEROL FUMARATE 160-4.5 MCG/ACT IN AERO
2.0000 | INHALATION_SPRAY | Freq: Two times a day (BID) | RESPIRATORY_TRACT | 2 refills | Status: DC
  Filled 2022-04-06: qty 10.2, 30d supply, fill #0

## 2022-04-06 MED ORDER — BUDESONIDE-FORMOTEROL FUMARATE 160-4.5 MCG/ACT IN AERO
2.0000 | INHALATION_SPRAY | Freq: Two times a day (BID) | RESPIRATORY_TRACT | 2 refills | Status: DC
  Filled 2022-04-06 – 2022-05-03 (×3): qty 10.2, 30d supply, fill #0
  Filled 2022-05-29 (×2): qty 10.2, 30d supply, fill #1
  Filled 2022-06-28: qty 10.2, 30d supply, fill #2

## 2022-04-06 NOTE — Assessment & Plan Note (Signed)
Patient requests refill of his Symbicort.  He states that he takes this daily.  He also uses albuterol for his asthma.  He states that he only takes his albuterol when it is needed. I informed him that new guidelines state that he could also use his Symbicort as needed.  Patient reports his symptoms are well controlled.  Plan: - Refill Symbicort and continue to take 2 puffs twice daily - Use albuterol and Symbicort as needed for asthma

## 2022-04-06 NOTE — Progress Notes (Signed)
CC: Muscle cramping  HPI:  [name] is a 56 y.o. male with a past medical history of diabetes, hypertension, and hyperlipidemia who presents to the clinic with concerns of muscle cramping.  He states that his muscle cramping is in his legs, back, arms, and neck.  Please see assessment and plan for full HPI.  Past Medical History:  Diagnosis Date   Allergy    Asthma    Bilateral chronic knee pain    Chronic back pain    Chronic shoulder pain    Depression    Diabetes mellitus without complication ([place])    diagnosed in 2015   GERD (gastroesophageal reflux disease)    History of herniated intervertebral disc    Hx of cardiovascular stress test    a. Lexiscan Myoview 10/29/12:  EF 56%, no ischemia   Hx of echocardiogram    a. Echo 10/24/12:  EF 60-65%, normal wall thickness, normal diastolic fxn   Hyperlipidemia    Hypertension    Reflux    Renal disorder    chronic kidney disease     Current Outpatient Medications:    acetaminophen (TYLENOL) 650 MG CR tablet, Take 1,300 mg by mouth every 8 (eight) hours as needed for pain., Disp: , Rfl:    albuterol (VENTOLIN HFA) 108 (90 Base) MCG/ACT inhaler, Inhale 2 puffs into the lungs every 6 (six) hours as needed for wheezing or shortness of breath., Disp: 18 g, Rfl: 3   budesonide-formoterol (SYMBICORT) 160-4.5 MCG/ACT inhaler, Inhale 2 puffs into the lungs 2 (two) times daily., Disp: 10.2 g, Rfl: 2   chlorthalidone (HYGROTON) 25 MG tablet, Take 1/2 tablet by mouth daily., Disp: 90 tablet, Rfl: 1   latanoprost (XALATAN) 0.005 % ophthalmic solution, Place 1 drop into both eyes at bedtime., Disp: , Rfl:    latanoprost (XALATAN) 0.005 % ophthalmic solution, Place 1 drop into both eyes at bedtime., Disp: 2.5 mL, Rfl: 3   latanoprost (XALATAN) 0.005 % ophthalmic solution, Place 1 drop into both eyes nightly., Disp: 2.5 mL, Rfl: 3   lidocaine (LIDODERM) 5 %, Place 1 patch onto the skin daily. Remove & Discard patch within 12 hours or as  directed by MD, Disp: 90 patch, Rfl: 1   lisinopril (ZESTRIL) 40 MG tablet, TAKE 1 TABLET (40 MG TOTAL) BY MOUTH DAILY., Disp: 90 tablet, Rfl: 1   metFORMIN (GLUCOPHAGE-XR) 500 MG 24 hr tablet, TAKE 1 TABLET (500 MG TOTAL) BY MOUTH 2 (TWO) TIMES DAILY., Disp: 180 tablet, Rfl: 3   phenylephrine-shark liver oil-mineral oil-petrolatum (PREPARATION H) 0.25-3-14-71.9 % rectal ointment, Place 1 application rectally 2 (two) times daily as needed for hemorrhoids., Disp: , Rfl:    rosuvastatin (CRESTOR) 40 MG tablet, TAKE 1/2 TABLET (20 MG TOTAL) BY MOUTH DAILY., Disp: 90 tablet, Rfl: 3  Review of Systems:   MSK: Patient endorses muscle cramping   Physical Exam:  Vitals:   04/06/22 1044  BP: 130/85  Pulse: 73  Temp: 98.3 F (36.8 C)  TempSrc: Oral  SpO2: 99%  Weight: 196 lb 4.8 oz (89 kg)    General: Alert and orientated x3. Patient is sitting comfortably in the room  Eyes:EOM intact  Head: Normocephalic, atraumatic  Neck: Supple, nontender, full range of motion, No JVD Cardio: Regular rate and rhythm, no murmurs, rubs or gallops. 2+ pulses to bilateral upper and lower extremities  Pulmonary: Clear to ausculation bilaterally with no rales, rhonchi, and crackles  Neuro: Sensation intact to bilateral lower extremities.  Patient is  able to distinguish between sharp and dull stimuli to his bilateral lower extremities on both the dorsal and plantar surfaces. Back: No midline tenderness, no step off or deformities noted. No paraspinal muscle tenderness.  MSK: 5/5 strength to upper and lower extremities.    Assessment & Plan:   Muscle cramping Patient reports a 19-month history of muscle cramping.  On previous visit, he reports that he was not compliant with his medications.  He states that he has recently been compliant with his medications.  Patient reports that he has muscle cramps to his arms, legs, back and his neck.  He states that they are tolerable for the most part, but they just are  always there.  They do not have any association with time of day.  He denies any injuries.  He reports that he drinks water well.  On examination he has no muscle tenderness or any rashes.  Patient is on a statin and I am concerned it could be statin induced myopathy.  This could also be dehydration or electrolyte imbalances.  Vitamin D levels could also be a factor.  Plan: - Vitamin D levels low, magnesium normal, CMP showing electrolyte imbalances.  CK mildly elevated at 348. - This could be due to a multitude of reasons. - Plan will be to discontinue statin for the next 2 weeks and recheck CK.  Given vitamin D level is low, patient will start a vitamin D supplement. - Try to differentiate between dehydration, vitamin D deficiency, or electrolyte balances versus statin induced myopathy - Counseled patient on stretching and staying hydrated  HLD (hyperlipidemia) Most recent lipid panel on 03/03 showed total cholesterol 214, LDL 125, and triglycerides 209.  Patient reported not being compliant with his medications at that time.  Current management includes rosuvastatin 40 mg daily.  He reports compliance with this now.  There is some speculation that this rosuvastatin could be causing his muscle cramping.  Plan will be to distinguish between other causes of cramping versus statin induced myopathy.  If patient is shown to have statin induced myopathy plan will be to start Zetia and decrease the dosage of the statin.  Plan: - Lipid panel today showing total cholesterol 184.  LDL 114. - For the time being discontinue rosuvastatin 40 mg for the next 2 weeks  Diabetes mellitus with stage 2 chronic kidney disease Patient's last A1c on 03/03 was 8.2.  We will obtain new A1c today.  Patient reports he has been more compliant with his medications.  His current regimen includes 500 mg metformin twice daily.  Given that he has been more compliant with his medication regimen, I will continue him on this until  his new A1c results.  If his A1c has worsened, I will plan to titrate up metformin and add on Jardiance. Patient is counseled on good diet and exercise  Plan: - Continue metformin 500 mg twice daily - A1c 12.2 this is up from 8.2 four months ago. - Have patient titrate up metformin to reach 1000 mg twice daily - Have patient follow-up in 3 months for diabetes checkup - Plan going forward would either add SGLT2 or GLP-1  Asthma Patient requests refill of his Symbicort.  He states that he takes this daily.  He also uses albuterol for his asthma.  He states that he only takes his albuterol when it is needed. I informed him that new guidelines state that he could also use his Symbicort as needed.  Patient reports his symptoms are  well controlled.  Plan: - Refill Symbicort and continue to take 2 puffs twice daily - Use albuterol and Symbicort as needed for asthma  Essential hypertension Patient presents to clinic with blood pressure of 130/85.  Patient was instructed to make a blood pressure log last visit.  Patient reports that he did not make one.  His current management includes chlorthalidone 12.5 mg daily and lisinopril 40 mg daily.  I strongly encouraged the patient to make a log and bring to next visit.  Plan: - Continue lisinopril 40 mg daily - Continue chlorthalidone 12.5 mg daily - Follow-up in 3 months for hypertension -BMP showing sodium of 132.  Chloride of 88.  Potassium of 4.8.  Creatinine at 1.46.  This is up from 1.29.  This could be GFR 56.  This could be due to dehydration or could be progression of his CKD.  Continue to monitor.  Vitamin D deficiency Vitamin D level low at 19.  Patient to start a vitamin D supplement daily.  Plan: - Start vitamin D supplement daily for 50,000 units weekly   Patient seen with Dr. Princella Pellegrini, DO PGY-1 Internal Medicine Resident  Pager: [phone]

## 2022-04-06 NOTE — Assessment & Plan Note (Signed)
Most recent lipid panel on 03/03 showed total cholesterol 214, LDL 125, and triglycerides 209.  Patient reported not being compliant with his medications at that time.  Current management includes rosuvastatin 40 mg daily.  He reports compliance with this now.  There is some speculation that this rosuvastatin could be causing his muscle cramping.  Plan will be to distinguish between other causes of cramping versus statin induced myopathy.  If patient is shown to have statin induced myopathy plan will be to start Zetia and decrease the dosage of the statin.  Plan: - Obtain lipid panel today - For the time being continue rosuvastatin 40 mg daily

## 2022-04-06 NOTE — Assessment & Plan Note (Signed)
Patient's last A1c on 03/03 was 8.2.  We will obtain new A1c today.  Patient reports he has been more compliant with his medications.  His current regimen includes 500 mg metformin twice daily.  Given that he has been more compliant with his medication regimen, I will continue him on this until his new A1c results.  If his A1c has worsened, I will plan to titrate up metformin and add on Jardiance. Patient is counseled on good diet and exercise  Plan: - Continue metformin 500 mg twice daily - Obtain A1c - Have patient follow-up in 3 months for diabetes checkup - If A1c is elevated, plan would be to uptitrate metformin, and add Jardiance.

## 2022-04-06 NOTE — Assessment & Plan Note (Addendum)
Patient reports a 8-month history of muscle cramping.  On previous visit, he reports that he was not compliant with his medications.  He states that he has recently been compliant with his medications.  Patient reports that he has muscle cramps to his arms, legs, back and his neck.  He states that they are tolerable for the most part, but they just are always there.  They do not have any association with time of day.  He denies any injuries.  He reports that he drinks water well.  On examination he has no muscle tenderness or any rashes.  Patient is on a statin and I am concerned it could be statin induced myopathy.  This could also be dehydration or electrolyte imbalances.  Vitamin D levels could also be a factor.  Plan: - Vitamin D levels low, magnesium normal, CMP showing electrolyte imbalances.  CK mildly elevated at 348. - This could be due to a multitude of reasons. - Plan will be to discontinue statin for the next 2 weeks and recheck CK.  Given vitamin D level is low, patient will start a vitamin D supplement. - Try to differentiate between dehydration, vitamin D deficiency, or electrolyte balances versus statin induced myopathy - Counseled patient on stretching and staying hydrated

## 2022-04-06 NOTE — Assessment & Plan Note (Addendum)
Patient presents to clinic with blood pressure of 130/85.  Patient was instructed to make a blood pressure log last visit.  Patient reports that he did not make one.  His current management includes chlorthalidone 12.5 mg daily and lisinopril 40 mg daily.  I strongly encouraged the patient to make a log and bring to next visit.  Plan: - Continue lisinopril 40 mg daily - Continue chlorthalidone 12.5 mg daily - Follow-up in 3 months for hypertension -Obtain BMP

## 2022-04-06 NOTE — Patient Instructions (Addendum)
Javier Beasley [facility] you for allowing me to take part in your care today.  Here are your instructions.  1.  Regarding your diabetes we took an A1c today.  Please await your results.  Please continue your metformin 500 mg twice a day.  Depending on your A1c, we might adjust your medications.  2.  Regarding your hypertension please continue to take your chlorthalidone 12.5 mg daily.  Please continue to take your lisinopril 40 mg daily.  Please maintain a log.  3.  Regarding your high cholesterol.  Continue taking rosuvastatin 40 mg daily.  I have repeated your lipid panel today.  Please await results.  4.  Regarding your cramping.  This can be either due to electrolyte imbalance, dehydration, or your cholesterol medication.  We drew labs today including a vitamin D level, magnesium, kidney function test.  Please await results.  I will call you with the results.  5.  Return in 3 months for diabetes checkup  Thank you, Dr. Allena Katz  If you have any other questions please contact the internal medicine clinic at [phone]

## 2022-04-08 LAB — LIPID PANEL
Chol/HDL Ratio: 4.4 ratio (ref 0.0–5.0)
Cholesterol, Total: 184 mg/dL (ref 100–199)
HDL: 42 mg/dL (ref >39)
LDL Chol Calc (NIH): 114 mg/dL — ABNORMAL HIGH (ref 0–99)
Triglycerides: 155 mg/dL — ABNORMAL HIGH (ref 0–149)
VLDL Cholesterol Cal: 28 mg/dL (ref 5–40)

## 2022-04-08 LAB — CMP14 + ANION GAP
ALT: 85 IU/L — ABNORMAL HIGH (ref 0–44)
AST: 42 IU/L — ABNORMAL HIGH (ref 0–40)
Albumin/Globulin Ratio: 1.9 (ref 1.2–2.2)
Albumin: 5.2 g/dL — ABNORMAL HIGH (ref 3.8–4.9)
Alkaline Phosphatase: 96 IU/L (ref 44–121)
Anion Gap: 18 mmol/L (ref 10.0–18.0)
BUN/Creatinine Ratio: 14 (ref 9–20)
BUN: 21 mg/dL (ref 6–24)
Bilirubin Total: 0.4 mg/dL (ref 0.0–1.2)
CO2: 26 mmol/L (ref 20–29)
Calcium: 10.6 mg/dL — ABNORMAL HIGH (ref 8.7–10.2)
Chloride: 88 mmol/L — ABNORMAL LOW (ref 96–106)
Creatinine, Ser: 1.46 mg/dL — ABNORMAL HIGH (ref 0.76–1.27)
Globulin, Total: 2.8 g/dL (ref 1.5–4.5)
Glucose: 388 mg/dL — ABNORMAL HIGH (ref 70–99)
Potassium: 4.8 mmol/L (ref 3.5–5.2)
Sodium: 132 mmol/L — ABNORMAL LOW (ref 134–144)
Total Protein: 8 g/dL (ref 6.0–8.5)
eGFR: 56 mL/min/{1.73_m2} — ABNORMAL LOW (ref >59)

## 2022-04-08 LAB — HEMOGLOBIN A1C
Est. average glucose Bld gHb Est-mCnc: 303 mg/dL
Hgb A1c MFr Bld: 12.2 % — ABNORMAL HIGH (ref 4.8–5.6)

## 2022-04-08 LAB — CK: Total CK: 348 U/L — ABNORMAL HIGH (ref 41–331)

## 2022-04-08 LAB — VITAMIN D 25 HYDROXY (VIT D DEFICIENCY, FRACTURES): Vit D, 25-Hydroxy: 19 ng/mL — ABNORMAL LOW (ref 30.0–100.0)

## 2022-04-08 LAB — MAGNESIUM: Magnesium: 2.2 mg/dL (ref 1.6–2.3)

## 2022-04-09 ENCOUNTER — Other Ambulatory Visit (HOSPITAL_COMMUNITY): Payer: Self-pay

## 2022-04-09 ENCOUNTER — Encounter: Payer: Self-pay | Admitting: Student

## 2022-04-09 MED ORDER — LATANOPROST 0.005 % OP SOLN
1.0000 [drp] | Freq: Every evening | OPHTHALMIC | 3 refills | Status: AC
  Filled 2022-04-09: qty 2.5, 50d supply, fill #0
  Filled 2022-05-01: qty 2.5, 25d supply, fill #0
  Filled 2022-05-03: qty 2.5, 50d supply, fill #0
  Filled 2022-05-29 – 2022-06-28 (×2): qty 2.5, 50d supply, fill #1
  Filled 2022-10-01: qty 2.5, 25d supply, fill #2
  Filled 2022-10-04: qty 2.5, 50d supply, fill #0
  Filled 2023-01-16: qty 2.5, 50d supply, fill #1

## 2022-04-09 NOTE — Telephone Encounter (Signed)
Spoke to the patient on the phone regarding his lab results.  Patient has an active MyChart account, and can see his labs.  Patient understands his lab results after explanation.  Patient will uptitrate his metformin.  We will see him back and evaluate the need for a SGLT2 versus GLP-1 which is likely.  Regarding his muscle cramps, his CK did come back at 348.  Given that it is borderline, still not sure if it is related to statin induced myopathy versus some other cause.  Plan moving forward will be to stop his statin for the next 2 weeks and recheck CK.  Vitamin D level did come back low, and this could be contributing, I do inform patient to start taking vitamin D supplement daily.  His magnesium was normal.  His CMP did show possible progression of his CKD and we will follow this.

## 2022-04-09 NOTE — Assessment & Plan Note (Signed)
Vitamin D level low at 19.  Patient to start a vitamin D supplement daily.  Plan: - Start vitamin D supplement daily for 50,000 units weekly

## 2022-04-19 NOTE — Progress Notes (Signed)
Internal Medicine Clinic Attending  I saw and evaluated the patient.  I personally confirmed the key portions of the history and exam documented by Dr. Patel and I reviewed pertinent patient test results.  The assessment, diagnosis, and plan were formulated together and I agree with the documentation in the resident's note.  

## 2022-04-20 ENCOUNTER — Other Ambulatory Visit (HOSPITAL_COMMUNITY): Payer: Self-pay

## 2022-04-20 ENCOUNTER — Other Ambulatory Visit: Payer: Self-pay

## 2022-05-01 ENCOUNTER — Other Ambulatory Visit: Payer: Self-pay | Admitting: Internal Medicine

## 2022-05-01 ENCOUNTER — Other Ambulatory Visit (HOSPITAL_COMMUNITY): Payer: Self-pay

## 2022-05-01 DIAGNOSIS — J45909 Unspecified asthma, uncomplicated: Secondary | ICD-10-CM

## 2022-05-01 MED ORDER — ALBUTEROL SULFATE HFA 108 (90 BASE) MCG/ACT IN AERS
2.0000 | INHALATION_SPRAY | Freq: Four times a day (QID) | RESPIRATORY_TRACT | 3 refills | Status: AC | PRN
  Filled 2022-05-01 – 2022-05-03 (×2): qty 6.7, 25d supply, fill #0
  Filled 2022-05-29 (×2): qty 6.7, 25d supply, fill #1
  Filled 2022-06-28: qty 6.7, 25d supply, fill #2
  Filled 2022-10-01: qty 6.7, 25d supply, fill #3
  Filled 2022-10-04: qty 6.7, 25d supply, fill #0
  Filled ????-??-??: fill #0

## 2022-05-02 ENCOUNTER — Other Ambulatory Visit (HOSPITAL_COMMUNITY): Payer: Self-pay

## 2022-05-03 ENCOUNTER — Other Ambulatory Visit (HOSPITAL_COMMUNITY): Payer: Self-pay

## 2022-05-04 ENCOUNTER — Other Ambulatory Visit (HOSPITAL_COMMUNITY): Payer: Self-pay

## 2022-05-21 ENCOUNTER — Encounter: Payer: Self-pay | Admitting: Student

## 2022-05-22 ENCOUNTER — Other Ambulatory Visit (HOSPITAL_COMMUNITY): Payer: Self-pay

## 2022-05-28 ENCOUNTER — Other Ambulatory Visit: Payer: Self-pay | Admitting: Student

## 2022-05-28 ENCOUNTER — Other Ambulatory Visit (HOSPITAL_COMMUNITY): Payer: Self-pay

## 2022-05-29 ENCOUNTER — Other Ambulatory Visit (HOSPITAL_COMMUNITY): Payer: Self-pay

## 2022-05-29 MED ORDER — LATANOPROST 0.005 % OP SOLN
OPHTHALMIC | 0 refills | Status: AC
  Filled 2022-05-29: qty 2.5, 25d supply, fill #0

## 2022-05-30 ENCOUNTER — Other Ambulatory Visit (HOSPITAL_COMMUNITY): Payer: Self-pay

## 2022-05-30 MED ORDER — PHENYLEPH-SHARK LIV OIL-MO-PET 0.25-3-14-71.9 % RE OINT
1.0000 | TOPICAL_OINTMENT | Freq: Two times a day (BID) | RECTAL | 0 refills | Status: AC | PRN
  Filled 2022-05-30 – 2022-06-28 (×2): qty 30, fill #0

## 2022-06-11 ENCOUNTER — Other Ambulatory Visit (HOSPITAL_COMMUNITY): Payer: Self-pay

## 2022-06-18 ENCOUNTER — Other Ambulatory Visit (HOSPITAL_COMMUNITY): Payer: Self-pay

## 2022-06-28 ENCOUNTER — Other Ambulatory Visit (HOSPITAL_COMMUNITY): Payer: Self-pay

## 2022-08-03 DIAGNOSIS — M5136 Other intervertebral disc degeneration, lumbar region: Secondary | ICD-10-CM | POA: Diagnosis not present

## 2022-08-03 DIAGNOSIS — M419 Scoliosis, unspecified: Secondary | ICD-10-CM | POA: Diagnosis not present

## 2022-08-03 DIAGNOSIS — M48061 Spinal stenosis, lumbar region without neurogenic claudication: Secondary | ICD-10-CM | POA: Diagnosis not present

## 2022-08-03 DIAGNOSIS — M25561 Pain in right knee: Secondary | ICD-10-CM | POA: Diagnosis not present

## 2022-08-03 DIAGNOSIS — M47812 Spondylosis without myelopathy or radiculopathy, cervical region: Secondary | ICD-10-CM | POA: Diagnosis not present

## 2022-08-03 DIAGNOSIS — M47816 Spondylosis without myelopathy or radiculopathy, lumbar region: Secondary | ICD-10-CM | POA: Diagnosis not present

## 2022-08-03 DIAGNOSIS — M25562 Pain in left knee: Secondary | ICD-10-CM | POA: Diagnosis not present

## 2022-08-03 DIAGNOSIS — M5126 Other intervertebral disc displacement, lumbar region: Secondary | ICD-10-CM | POA: Diagnosis not present

## 2022-09-23 ENCOUNTER — Other Ambulatory Visit: Payer: Self-pay | Admitting: Student

## 2022-09-23 ENCOUNTER — Other Ambulatory Visit: Payer: Self-pay

## 2022-09-23 DIAGNOSIS — J45909 Unspecified asthma, uncomplicated: Secondary | ICD-10-CM

## 2022-09-24 ENCOUNTER — Other Ambulatory Visit (HOSPITAL_COMMUNITY): Payer: Self-pay

## 2022-09-24 MED ORDER — BUDESONIDE-FORMOTEROL FUMARATE 160-4.5 MCG/ACT IN AERO
2.0000 | INHALATION_SPRAY | Freq: Two times a day (BID) | RESPIRATORY_TRACT | 2 refills | Status: AC
  Filled 2022-10-01 – 2022-10-04 (×2): qty 10.2, 30d supply, fill #0
  Filled 2023-01-16: qty 10.2, 30d supply, fill #1

## 2022-10-01 ENCOUNTER — Other Ambulatory Visit: Payer: Self-pay

## 2022-10-01 ENCOUNTER — Other Ambulatory Visit (HOSPITAL_COMMUNITY): Payer: Self-pay

## 2022-10-04 ENCOUNTER — Other Ambulatory Visit (HOSPITAL_COMMUNITY): Payer: Self-pay

## 2022-10-04 ENCOUNTER — Other Ambulatory Visit: Payer: Self-pay

## 2022-10-04 DIAGNOSIS — E1165 Type 2 diabetes mellitus with hyperglycemia: Secondary | ICD-10-CM | POA: Diagnosis not present

## 2022-10-04 DIAGNOSIS — E785 Hyperlipidemia, unspecified: Secondary | ICD-10-CM | POA: Diagnosis not present

## 2022-10-04 DIAGNOSIS — I10 Essential (primary) hypertension: Secondary | ICD-10-CM | POA: Diagnosis not present

## 2022-10-05 ENCOUNTER — Other Ambulatory Visit (HOSPITAL_COMMUNITY): Payer: Self-pay

## 2022-10-05 ENCOUNTER — Other Ambulatory Visit: Payer: Self-pay

## 2022-10-08 ENCOUNTER — Other Ambulatory Visit: Payer: Self-pay

## 2022-10-08 DIAGNOSIS — E785 Hyperlipidemia, unspecified: Secondary | ICD-10-CM | POA: Diagnosis not present

## 2022-10-08 DIAGNOSIS — I10 Essential (primary) hypertension: Secondary | ICD-10-CM | POA: Diagnosis not present

## 2022-10-08 DIAGNOSIS — E1165 Type 2 diabetes mellitus with hyperglycemia: Secondary | ICD-10-CM | POA: Diagnosis not present

## 2022-10-12 ENCOUNTER — Other Ambulatory Visit (HOSPITAL_COMMUNITY): Payer: Self-pay

## 2022-10-17 DIAGNOSIS — E119 Type 2 diabetes mellitus without complications: Secondary | ICD-10-CM | POA: Diagnosis not present

## 2022-10-22 DIAGNOSIS — Z0100 Encounter for examination of eyes and vision without abnormal findings: Secondary | ICD-10-CM | POA: Diagnosis not present

## 2022-10-29 DIAGNOSIS — R809 Proteinuria, unspecified: Secondary | ICD-10-CM | POA: Diagnosis not present

## 2022-10-29 DIAGNOSIS — E1165 Type 2 diabetes mellitus with hyperglycemia: Secondary | ICD-10-CM | POA: Diagnosis not present

## 2022-10-29 DIAGNOSIS — I10 Essential (primary) hypertension: Secondary | ICD-10-CM | POA: Diagnosis not present

## 2022-10-29 DIAGNOSIS — E785 Hyperlipidemia, unspecified: Secondary | ICD-10-CM | POA: Diagnosis not present

## 2023-01-16 ENCOUNTER — Other Ambulatory Visit (HOSPITAL_COMMUNITY): Payer: Self-pay

## 2023-01-16 ENCOUNTER — Other Ambulatory Visit: Payer: Self-pay | Admitting: Internal Medicine

## 2023-01-16 DIAGNOSIS — I10 Essential (primary) hypertension: Secondary | ICD-10-CM

## 2023-01-16 DIAGNOSIS — J45909 Unspecified asthma, uncomplicated: Secondary | ICD-10-CM

## 2023-01-17 ENCOUNTER — Other Ambulatory Visit (HOSPITAL_COMMUNITY): Payer: Self-pay

## 2023-01-17 ENCOUNTER — Other Ambulatory Visit: Payer: Self-pay

## 2023-01-17 MED ORDER — LISINOPRIL 40 MG PO TABS
40.0000 mg | ORAL_TABLET | Freq: Every day | ORAL | 1 refills | Status: AC
  Filled 2023-01-17: qty 90, 90d supply, fill #0

## 2023-01-18 NOTE — Telephone Encounter (Signed)
PCP was removed on 03/10/22.  Documentation under patient comment section on Appt Desk that he is no longer our pt and has moved to [place].

## 2023-01-25 ENCOUNTER — Other Ambulatory Visit (HOSPITAL_COMMUNITY): Payer: Self-pay

## 2023-02-15 ENCOUNTER — Other Ambulatory Visit (HOSPITAL_COMMUNITY): Payer: Self-pay
